# Patient Record
Sex: Male | Born: 1945 | Hispanic: Yes | Smoking: Former smoker
Health system: Southern US, Community
[De-identification: ages and names within clinical notes are randomized; demographics above are authoritative.]

## PROBLEM LIST (undated history)

## (undated) DIAGNOSIS — Z9289 Personal history of other medical treatment: Secondary | ICD-10-CM

## (undated) DIAGNOSIS — N186 End stage renal disease: Secondary | ICD-10-CM

## (undated) DIAGNOSIS — I2510 Atherosclerotic heart disease of native coronary artery without angina pectoris: Secondary | ICD-10-CM

## (undated) DIAGNOSIS — N179 Acute kidney failure, unspecified: Secondary | ICD-10-CM

## (undated) DIAGNOSIS — M1990 Unspecified osteoarthritis, unspecified site: Secondary | ICD-10-CM

## (undated) DIAGNOSIS — M109 Gout, unspecified: Secondary | ICD-10-CM

## (undated) DIAGNOSIS — D649 Anemia, unspecified: Secondary | ICD-10-CM

## (undated) DIAGNOSIS — I10 Essential (primary) hypertension: Secondary | ICD-10-CM

## (undated) DIAGNOSIS — Z992 Dependence on renal dialysis: Secondary | ICD-10-CM

## (undated) HISTORY — PX: APPENDECTOMY: SHX54

## (undated) HISTORY — PX: CATARACT EXTRACTION W/ INTRAOCULAR LENS  IMPLANT, BILATERAL: SHX1307

## (undated) HISTORY — PX: INGUINAL HERNIA REPAIR: SUR1180

## (undated) HISTORY — PX: COLONOSCOPY: SHX174

## (undated) HISTORY — PX: CORONARY ANGIOPLASTY WITH STENT PLACEMENT: SHX49

---

## 1990-04-04 HISTORY — PX: AV FISTULA PLACEMENT: SHX1204

## 1992-04-04 HISTORY — PX: KIDNEY TRANSPLANT: SHX239

## 2012-04-09 ENCOUNTER — Encounter (HOSPITAL_COMMUNITY): Payer: Self-pay | Admitting: Emergency Medicine

## 2012-04-09 ENCOUNTER — Emergency Department (INDEPENDENT_AMBULATORY_CARE_PROVIDER_SITE_OTHER)
Admission: EM | Admit: 2012-04-09 | Discharge: 2012-04-09 | Discharge status: Against Medical Advice | Payer: Medicare Other | Source: Home / Self Care | Attending: Emergency Medicine | Admitting: Emergency Medicine

## 2012-04-09 DIAGNOSIS — R05 Cough: Secondary | ICD-10-CM

## 2012-04-09 DIAGNOSIS — I4891 Unspecified atrial fibrillation: Secondary | ICD-10-CM

## 2012-04-09 HISTORY — DX: Essential (primary) hypertension: I10

## 2012-04-09 MED ORDER — BENZONATATE 200 MG PO CAPS: 200.0000 mg | ORAL_CAPSULE | Freq: Three times a day (TID) | ORAL | Status: DC | PRN

## 2012-04-09 MED ORDER — AZITHROMYCIN 250 MG PO TABS: ORAL_TABLET | ORAL | Status: DC

## 2012-04-09 NOTE — ED Provider Notes (Signed)
Chief Complaint  Patient presents with  . URI    History of Present Illness:   The patient is a 67 year old male who was visiting [place] from [place]. He presents today with a four-day history of a dry cough cough. He denies any shortness of breath, wheezing, chest tightness, or chest pain. He's had no sensation of palpitations, dizziness, or syncope. 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 He also has had a history of atrial fibrillation, he was taking antiarrhythmics and Coumadin but stopped these, I'm not certain whether he did this on a Zocor under medical direction. He hasn't had any fever, chills, nasal congestion, drainage, or sore throat. He has no history of asthma or COPD. He is not a cigarette smoker. He denies any reflux symptoms.  Review of Systems:  Other than noted above, the patient denies any of the following symptoms: Systemic:  No fevers, chills, sweats, weight loss or gain, fatigue, or tiredness. ENT:  No nasal congestion, sneezing, itching, postnasal drip, sinus pressure, headache, sore throat, or hoarseness. Lungs:  No wheezing, shortness of breath, chest tightness or congestion. Heart:  No chest pain, tightness, pressure, PND, orthopnea, or ankle edema. GI:  No indigestion, heartburn, waterbrash, burping, abdominal pain, nausea, or vomiting.  PMFSH:  Past medical history, family history, social history, meds, and allergies were reviewed.  Specifically, there is no history of asthma, allergies, reflux esophagitis or cigarette smoking.   Physical Exam:   Vital signs:  BP 157/84  Pulse 116  Temp 99.2 F (37.3 C) (Oral)  Resp 24  SpO2 96% General:  Alert and oriented.  In no distress.  Skin warm and dry. ENT: TMs and ear canals normal.  Nasal mucosa normal, without drainage.  Pharynx clear without exudate or drainage.  No intraoral lesions. Neck:  No adenopathy, tenderness or mass.  No JVD. Lungs:  No respiratory distress.  Breath sounds  clear and equal bilaterally.  No wheezes, rales or rhonchi. Heart:  Regular rhythm, no gallops or murmers.  No pedal edema. Abdomon:  Soft and nontender.  No organomegaly or mass.   Date: 04/09/2012  Rate: 113  Rhythm: atrial fibrillation  QRS Axis: left  Intervals: normal  ST/T Wave abnormalities: normal  Conduction Disutrbances:right bundle branch block and left anterior fascicular block  Narrative Interpretation: Atrial fibrillation with rapid ventricular response with premature ventricular or aberrantly conducted complexes, right bundle branch block, left anterior fascicular block, lateral infarct, age undetermined.  Old EKG Reviewed: none available  Assessment:  The primary encounter diagnosis was Cough. A diagnosis of Atrial fibrillation was also pertinent to this visit.  I am not certain whether the atrial fibrillation is new or something that's been going on for a while. His rate was not controlled and this may be contributing to his cough. I advised the patient that the best course would be for him to go to the hospital emergency room for further evaluation and he is not willing to do this and therefore signed out AGAINST MEDICAL ADVICE. I did give him some antibiotics and cough medications and advised that if he should get worse in any way that he immediately returned to the hospital emergency room.  Plan:   1.  The following meds were prescribed:   New Prescriptions   AZITHROMYCIN (ZITHROMAX Z-PAK) 250 MG TABLET    Take as directed.   BENZONATATE (TESSALON) 200 MG CAPSULE    Take 1 capsule (200 mg total) by mouth 3 (three) times  daily as needed for cough.   2.  The patient was instructed in symptomatic care and handouts were given. 3.  The patient was told to return if becoming worse in any way, if no better in 3 or 4 days, and given some red flag symptoms that would indicate earlier return.     Reuben Likes, MD 04/09/12 2222

## 2012-04-09 NOTE — ED Notes (Signed)
Pt c/o cold sx x3 days Sx include: dry cough, headache and nauseas Denies: fevers, vomiting, diarrhea, sore throat, congestion He is alert w/no signs of acute distress.

## 2012-04-09 NOTE — ED Notes (Signed)
Approx 2 min 12-lead rhythm strip done - given to Dr. Lorenz Coaster w/ ECG. Pt here visiting from [place] - unable to obtain previous ECG.

## 2012-05-11 LAB — PROTIME-INR

## 2012-05-14 ENCOUNTER — Emergency Department (INDEPENDENT_AMBULATORY_CARE_PROVIDER_SITE_OTHER)
Admission: EM | Admit: 2012-05-14 | Discharge: 2012-05-14 | Disposition: A | Discharge status: Home / Self Care | Payer: Medicare Other | Source: Home / Self Care | Attending: Family Medicine | Admitting: Family Medicine

## 2012-05-14 ENCOUNTER — Encounter (HOSPITAL_COMMUNITY): Payer: Self-pay

## 2012-05-14 DIAGNOSIS — I4891 Unspecified atrial fibrillation: Secondary | ICD-10-CM

## 2012-05-14 DIAGNOSIS — M109 Gout, unspecified: Secondary | ICD-10-CM

## 2012-05-14 DIAGNOSIS — I10 Essential (primary) hypertension: Secondary | ICD-10-CM

## 2012-05-14 DIAGNOSIS — Z940 Kidney transplant status: Secondary | ICD-10-CM

## 2012-05-14 LAB — COMPREHENSIVE METABOLIC PANEL
Albumin: 3.5 g/dL (ref 3.5–5.2)
Alkaline Phosphatase: 92 U/L (ref 39–117)
BUN: 30 mg/dL — ABNORMAL HIGH (ref 6–23)
CO2: 24 mEq/L (ref 19–32)
Chloride: 102 mEq/L (ref 96–112)
GFR calc Af Amer: 53 mL/min — ABNORMAL LOW (ref >90)
GFR calc non Af Amer: 46 mL/min — ABNORMAL LOW (ref >90)
Glucose, Bld: 176 mg/dL — ABNORMAL HIGH (ref 70–99)
Potassium: 5.6 mEq/L — ABNORMAL HIGH (ref 3.5–5.1)
Total Bilirubin: 0.6 mg/dL (ref 0.3–1.2)

## 2012-05-14 LAB — CBC
MCV: 88.8 fL (ref 78.0–100.0)
Platelets: 130 10*3/uL — ABNORMAL LOW (ref 150–400)
RDW: 14.5 % (ref 11.5–15.5)
WBC: 6.2 10*3/uL (ref 4.0–10.5)

## 2012-05-14 NOTE — Discharge Instructions (Signed)
Atrial Fibrillation Atrial fibrillation is an abnormal heartbeat (rhythm). It can cause your heart rate to be faster or slower than normal, and can cause clots of blood to form in your heart. These clots can cause other health problems. Atrial fibrillation may be caused by a heart attack, lung problem, or certain medicine. Sometimes the cause of atrial fibrillation is not found. HOME CARE  Take blood thinning medicine (anticoagulants) as told by your doctor. Your doctor will need to draw your blood to check lab values if you take blood thinners.  If you had a cardioversion, limit your activity as told by your doctor.  Learn how to check your heartbeat (pulse) for an abnormal or irregular beat. Your doctor can show you how.  Ask your doctor if it is okay to exercise.  Only take medicine as told by your doctor. GET HELP RIGHT AWAY IF:   You have trouble breathing or feel dizzy.  You have puffy (swollen) feet or ankles.  You have blood in your pee (urine) or poop (bowel movement).  You feel your heart "skipping" beats.  You feel your heart "racing" or beating fast.  You have weakness in your arms or legs.  You have trouble talking, seeing, or thinking.  You have chest pain or pain in your arm or jaw. MAKE SURE YOU:   Understand these instructions.  Will watch your condition.  Will get help right away if you are not doing well or get worse. Document Released: 12/29/2007 Document Revised: 06/13/2011 Document Reviewed: 07/09/2009 [facility] Patient Information 2013 [place], [place].  Aspirin and Your Heart Aspirin affects the way your blood clots and helps "thin" the blood. Aspirin has many uses in heart disease. It may be used as a primary prevention to help reduce the risk of heart related events. It also can be used as a secondary measure to prevent more heart attacks or to prevent additional damage from blood clots.  ASPIRIN MAY HELP IF YOU:  Have had a heart attack or chest  pain.  Have undergone open heart surgery such as CABG (Coronary Artery Bypass Surgery).  Have had coronary angioplasty with or without stents.  Have experienced a stroke or TIA (transient ischemic attack).  Have peripheral vascular disease (PAD).  Have chronic heart rhythm problems such as atrial fibrillation.  Are at risk for heart disease. BEFORE STARTING ASPIRIN Before you start taking aspirin, your caregiver will need to review your medical history. Many things will need to be taken into consideration, such as:  Smoking status.  Blood pressure.  Diabetes.  Gender.  Weight.  Cholesterol level. ASPIRIN DOSES  Aspirin should only be taken on the advice of your caregiver. Talk to your caregiver about how much aspirin you should take. Aspirin comes in different doses such as:  81 mg.  162 mg.  325 mg.  The aspirin dose you take may be affected by many factors, some of which include:  Your current medications, especially if your are taking blood-thinners or anti-platelet medicine.  Liver function.  Heart disease risk.  Age.  Aspirin comes in two forms:  Non-enteric-coated. This type of aspirin does not have a coating and is absorbed faster. Non-enteric coated aspirin is recommended for patients experiencing chest pain symptoms. This type of aspirin also comes in a chewable form.  Enteric-coated. This means the aspirin has a special coating that releases the medicine very slowly. Enteric-coated aspirin causes less stomach upset. This type of aspirin should not be chewed or crushed. ASPIRIN SIDE  EFFECTS Daily use of aspirin can increase your risk of serious side effects, some of these include:  Increased bleeding. This can range from a cut that does not stop bleeding to more serious problems such as stomach bleeding or bleeding into the brain (Intracerebral bleeding).  Increased bruising.  Stomach upset.  An allergic reaction such as red, itchy  skin.  Increased risk of bleeding when combined with non-steroidal anti-inflammatory medicine (NSAIDS).  Alcohol should be drank in moderation when taking aspirin. Alcohol can increase the risk of stomach bleeding when taken with aspirin.  Aspirin should not be given to children less than 7 years of age due to the association of Reye syndrome. Reye syndrome is a serious illness that can affect the brain and liver. Studies have linked Reye syndrome with aspirin use in children.  People that have nasal polyps have an increased risk of developing an aspirin allergy. SEEK MEDICAL CARE IF:   You develop an allergic reaction such as:  Hives.  Itchy skin.  Swelling of the lips, tongue or face.  You develop stomach pain.  You have unusual bleeding or bruising.  You have ringing in your ears. SEEK IMMEDIATE MEDICAL CARE IF:   You have severe chest pain, especially if the pain is crushing or pressure-like and spreads to the arms, back, neck, or jaw. THIS IS AN EMERGENCY. Do not wait to see if the pain will go away. Get medical help at once. Call your local emergency services (911 in the [place] DO NOT drive yourself to the hospital.  You have stroke-like symptoms such as:  Loss of vision.  Difficulty talking.  Numbness or weakness on one side of your body.  Numbness or weakness in your arm or leg.  Not thinking clearly or feeling confused.  Your bowel movements are bloody, dark red or black in color.  You vomit or cough up blood.  You have blood in your urine.  You have shortness of breath, coughing or wheezing. MAKE SURE YOU:   Understand these instructions.  Will monitor your condition.  Seek immediate medical care if necessary. Document Released: 03/03/2008 Document Revised: 06/13/2011 Document Reviewed: 03/03/2008 [facility] Patient Information 2013 [place], [place].  DASH Diet The DASH diet stands for "Dietary Approaches to Stop Hypertension." It is a healthy eating  plan that has been shown to reduce high blood pressure (hypertension) in as little as 14 days, while also possibly providing other significant health benefits. These other health benefits include reducing the risk of breast cancer after menopause and reducing the risk of type 2 diabetes, heart disease, colon cancer, and stroke. Health benefits also include weight loss and slowing kidney failure in patients with chronic kidney disease.  DIET GUIDELINES  Limit salt (sodium). Your diet should contain less than 1500 mg of sodium daily.  Limit refined or processed carbohydrates. Your diet should include mostly whole grains. Desserts and added sugars should be used sparingly.  Include small amounts of heart-healthy fats. These types of fats include nuts, oils, and tub margarine. Limit saturated and trans fats. These fats have been shown to be harmful in the body. CHOOSING FOODS  The following food groups are based on a 2000 calorie diet. See your Registered Dietitian for individual calorie needs. Grains and Grain Products (6 to 8 servings daily)  Eat More Often: Whole-wheat bread, brown rice, whole-grain or wheat pasta, quinoa, popcorn without added fat or salt (air popped).  Eat Less Often: White bread, white pasta, white rice, cornbread. Vegetables (4 to  5 servings daily)  Eat More Often: Fresh, frozen, and canned vegetables. Vegetables may be raw, steamed, roasted, or grilled with a minimal amount of fat.  Eat Less Often/Avoid: Creamed or fried vegetables. Vegetables in a cheese sauce. Fruit (4 to 5 servings daily)  Eat More Often: All fresh, canned (in natural juice), or frozen fruits. Dried fruits without added sugar. One hundred percent fruit juice ( cup [237 mL] daily).  Eat Less Often: Dried fruits with added sugar. Canned fruit in light or heavy syrup. [employer], Fish, and Poultry (2 servings or less daily. One serving is 3 to 4 oz [85-114 g]).  Eat More Often: Ninety percent or  leaner ground beef, tenderloin, sirloin. Round cuts of beef, chicken breast, [place] breast. All fish. Grill, bake, or broil your meat. Nothing should be fried.  Eat Less Often/Avoid: Fatty cuts of meat, [place], or chicken leg, thigh, or wing. Fried cuts of meat or fish. Dairy (2 to 3 servings)  Eat More Often: Low-fat or fat-free milk, low-fat plain or light yogurt, reduced-fat or part-skim cheese.  Eat Less Often/Avoid: Milk (whole, 2%).Whole milk yogurt. Full-fat cheeses. Nuts, Seeds, and Legumes (4 to 5 servings per week)  Eat More Often: All without added salt.  Eat Less Often/Avoid: Salted nuts and seeds, canned beans with added salt. Fats and Sweets (limited)  Eat More Often: Vegetable oils, tub margarines without trans fats, sugar-free gelatin. Mayonnaise and salad dressings.  Eat Less Often/Avoid: Coconut oils, palm oils, butter, stick margarine, cream, half and half, cookies, candy, pie. FOR MORE INFORMATION The Dash Diet Eating Plan: www.dashdiet.org Document Released: 03/10/2011 Document Revised: 06/13/2011 Document Reviewed: 03/10/2011 [facility] Patient Information 2013 [place], [place].  Coronary Artery Disease, Risk Factors Research has shown that the risk of developing coronary artery disease (CAD) and having a heart attack increases with each factor you have. RISK FACTORS YOU CANNOT CHANGE  Your age. Your risk goes up as you get older. Most heart attacks happen to people over the age of 40.  Gender. Men have a greater risk of heart attack than women, and they have attacks earlier in life. However, women are more likely to die from a heart attack.  Heredity. Children of parents with heart disease are more likely to develop it themselves.  Race. African Americans and other ethnic groups have a higher risk, possibly because of high blood pressure, a tendency toward obesity, and diabetes.  Your family. Most people with a strong family history of heart disease have one or  more other risk factors. RISK FACTORS YOU CAN CHANGE  Exposure to tobacco smoke. Even secondhand smoke greatly increases the risk for heart disease.  High blood cholesterol may be lowered with changes in diet, activity, and medicines.  High blood pressure makes the heart work harder. This causes the heart muscles to become thick and, eventually, weaker. It also increases your risk of stroke, heart attack, and kidney or heart failure.  Physical inactivity is a risk factor for CAD. Regular physical activity helps prevent heart and blood vessel disease. Exercise helps control blood cholesterol, diabetes, obesity, and it may help lower blood pressure in some people.  Excess body fat, especially belly fat, increases the risk of heart disease and stroke even if there are no other risk factors. Excess weight increases the heart's workload and raises blood pressure and blood cholesterol.  Diabetes seriously increases your risk of developing CAD. If you have diabetes, you should work with your caregiver to manage it and control other  risk factors. OTHER RISK FACTORS FOR CAD  How you respond to stress.  Drinking too much alcohol may raise blood pressure, cause heart failure, and lead to stroke.  Total cholesterol greater than 200 milligrams.  HDL (good) cholesterol less than 40 milligrams. HDL helps keep cholesterol from building up in the walls of the arteries. PREVENTING CAD  Maintain a healthy weight.  Exercise or do physical activity.  Eat a heart-healthy diet low in fat and salt and high in fiber.  Control your blood pressure to keep it below 120 over 80.  Keep your cholesterol at a level that lowers your risk.  Manage diabetes if you have it.  Stop smoking.  Learn how to manage stress. HEART SMART SUBSTITUTIONS  Instead of whole or 2% milk and cream, use skim milk.  Instead of fried foods, eat baked, steamed, boiled, broiled, or microwaved foods.  Instead of lard, butter,  palm and coconut oils, cook with unsaturated vegetable oils, such as corn, olive, canola, safflower, sesame, soybean, sunflower, or peanut.  Instead of fatty cuts of meat, eat lean cuts of meat or cut off the fatty parts.  Instead of 1 whole egg in recipes, use 2 egg whites.  Instead of sauces, butter, and salt, season vegetables with herbs and spices.  Instead of regular hard and processed cheeses, eat low-fat, low-sodium cheeses.  Instead of salted potato chips, choose low-fat, unsalted tortilla and potato chips and unsalted pretzels and popcorn.  Instead of sour cream and mayonnaise, use plain low-fat yogurt, low-fat cottage cheese, or low-fat or "light" sour cream. FOR MORE INFORMATION  National Heart Lung and Blood Institute: https://nielsen.com/ American Heart Association: PopSteam.is Document Released: 06/11/2003 Document Revised: 06/13/2011 Document Reviewed: 06/06/2007 [facility] Patient Information 2013 [place], [place].  How to Take Your Blood Pressure  These instructions are only for electronic home blood pressure machines. You will need:   An automatic or semi-automatic blood pressure machine.  Fresh batteries for the blood pressure machine. HOW DO I USE THESE TOOLS TO CHECK MY BLOOD PRESSURE?   There are 2 numbers that make up your blood pressure. For example: 120/80.  The first number (120 in our example) is called the "systolic pressure." It is a measure of the pressure in your blood vessels when your heart is pumping blood.  The second number (80 in our example) is called the "diastolic pressure." It is a measure of the pressure in your blood vessels when your heart is resting between beats.  Before you buy a home blood pressure machine, check the size of your arm so you can buy the right size cuff. Here is how to check the size of your arm:  Use a tape measure that shows both inches and centimeters.  Wrap the tape measure around the middle  upper part of your arm. You may need someone to help you measure right.  Write down your arm measurement in both inches and centimeters.  To measure your blood pressure right, it is important to have the right size cuff.  If your arm is up to 13 inches (37 to 34 centimeters), get an adult cuff size.  If your arm is 13 to 17 inches (35 to 44 centimeters), get a large adult cuff size.  If your arm is 17 to 20 inches (45 to 52 centimeters), get an adult thigh cuff.  Try to rest or relax for at least 30 minutes before you check your blood pressure.  Do not smoke.  Do not have any  drinks with caffeine, such as:  Pop.  Coffee.  Tea.  Check your blood pressure in a quiet room.  Sit down and stretch out your arm on a table. Keep your arm at about the level of your heart. Let your arm relax. GETTING BLOOD PRESSURE READINGS  Make sure you remove any tight-fighting clothing from your arm. Wrap the cuff around your upper arm. Wrap it just above the bend, and above where you felt the pulse. You should be able to slip a finger between the cuff and your arm. If you cannot slip a finger in the cuff, it is too tight and should be removed and rewrapped.  Some units requires you to manually pump up the arm cuff.  Automatic units inflate the cuff when you press a button.  Cuff deflation is automatic in both models.  After the cuff is inflated, the unit measures your blood pressure and pulse. The readings are displayed on a monitor. Hold still and breathe normally while the cuff is inflated.  Getting a reading takes less than a minute.  Some models store readings in a memory. Some provide a printout of readings.  Get readings at different times of the day. You should wait at least 5 minutes between readings. Take readings with you to your next doctor's visit. Document Released: 03/03/2008 Document Revised: 06/13/2011 Document Reviewed: 03/03/2008 [facility] Patient Information 2013 [place],  [place].  Gout Gout is caused by a buildup of uric acid crystals in the joints. The crystals make your joints sore. This is like having sand in your joints. Repeat attacks are common. Gout can be treated. HOME CARE   Do not take aspirin for pain.  Only take medicine as told by your doctor.  You may use cold treatments (ice) on painful joints.  Put ice in a plastic bag.  Place a towel between your skin and the bag.  Leave the ice on for 15 to 20 minutes at a time, 3 to 4 times a day.  Rest in bed as much as possible. When in bed, keep the sheets and blankets off your sore joints.  Keep the sore joints raised (elevated).  Use crutches if your legs or ankles hurt.  Drink enough water and fluids to keep your pee (urine) clear or pale yellow. This helps your body get rid of uric acid. Do not drink alcohol.  Follow diet instructions as told by your doctor.  Keep your body at a healthy weight. GET HELP RIGHT AWAY IF:   You have a temperature by mouth above 102 F (38.9 C), not controlled by medicine.  You have watery poop (diarrhea).  You are throwing up (vomiting).  You do not feel better in 1 day, or you are getting worse.  Your joint hurts more.  You have the chills. MAKE SURE YOU:   Understand these instructions.  Will watch your condition.  Will get help right away if you are not doing well or get worse. Document Released: 12/29/2007 Document Revised: 06/13/2011 Document Reviewed: 06/29/2009 [facility] Patient Information 2013 [place], [place].  Gout Gout is an inflammatory condition (arthritis) caused by a buildup of uric acid crystals in the joints. Uric acid is a chemical that is normally present in the blood. Under some circumstances, uric acid can form into crystals in your joints. This causes joint redness, soreness, and swelling (inflammation). Repeat attacks are common. Over time, uric acid crystals can form into masses (tophi) near a joint, causing disfigurement.  Gout is treatable and  often preventable. CAUSES  The disease begins with elevated levels of uric acid in the blood. Uric acid is produced by your body when it breaks down a naturally found substance called purines. This also happens when you eat certain foods such as meats and fish. Causes of an elevated uric acid level include:  Being passed down from parent to child (heredity).  Diseases that cause increased uric acid production (obesity, psoriasis, some cancers).  Excessive alcohol use.  Diet, especially diets rich in meat and seafood.  Medicines, including certain cancer-fighting drugs (chemotherapy), diuretics, and aspirin.  Chronic kidney disease. The kidneys are no longer able to remove uric acid well.  Problems with metabolism. Conditions strongly associated with gout include:  Obesity.  High blood pressure.  High cholesterol.  Diabetes. Not everyone with elevated uric acid levels gets gout. It is not understood why some people get gout and others do not. Surgery, joint injury, and eating too much of certain foods are some of the factors that can lead to gout. SYMPTOMS   An attack of gout comes on quickly. It causes intense pain with redness, swelling, and warmth in a joint.  Fever can occur.  Often, only one joint is involved. Certain joints are more commonly involved:  Base of the big toe.  Knee.  Ankle.  Wrist.  Finger. Without treatment, an attack usually goes away in a few days to weeks. Between attacks, you usually will not have symptoms, which is different from many other forms of arthritis. DIAGNOSIS  Your caregiver will suspect gout based on your symptoms and exam. Removal of fluid from the joint (arthrocentesis) is done to check for uric acid crystals. Your caregiver will give you a medicine that numbs the area (local anesthetic) and use a needle to remove joint fluid for exam. Gout is confirmed when uric acid crystals are seen in joint fluid, using a  special microscope. Sometimes, blood, urine, and X-ray tests are also used. TREATMENT  There are 2 phases to gout treatment: treating the sudden onset (acute) attack and preventing attacks (prophylaxis). Treatment of an Acute Attack  Medicines are used. These include anti-inflammatory medicines or steroid medicines.  An injection of steroid medicine into the affected joint is sometimes necessary.  The painful joint is rested. Movement can worsen the arthritis.  You may use warm or cold treatments on painful joints, depending which works best for you.  Discuss the use of coffee, vitamin C, or cherries with your caregiver. These may be helpful treatment options. Treatment to Prevent Attacks After the acute attack subsides, your caregiver may advise prophylactic medicine. These medicines either help your kidneys eliminate uric acid from your body or decrease your uric acid production. You may need to stay on these medicines for a very long time. The early phase of treatment with prophylactic medicine can be associated with an increase in acute gout attacks. For this reason, during the first few months of treatment, your caregiver may also advise you to take medicines usually used for acute gout treatment. Be sure you understand your caregiver's directions. You should also discuss dietary treatment with your caregiver. Certain foods such as meats and fish can increase uric acid levels. Other foods such as dairy can decrease levels. Your caregiver can give you a list of foods to avoid. HOME CARE INSTRUCTIONS   Do not take aspirin to relieve pain. This raises uric acid levels.  Only take over-the-counter or prescription medicines for pain, discomfort, or fever as directed by your  caregiver.  Rest the joint as much as possible. When in bed, keep sheets and blankets off painful areas.  Keep the affected joint raised (elevated).  Use crutches if the painful joint is in your leg.  Drink enough  water and fluids to keep your urine clear or pale yellow. This helps your body get rid of uric acid. Do not drink alcoholic beverages. They slow the passage of uric acid.  Follow your caregiver's dietary instructions. Pay careful attention to the amount of protein you eat. Your daily diet should emphasize fruits, vegetables, whole grains, and fat-free or low-fat milk products.  Maintain a healthy body weight. SEEK MEDICAL CARE IF:   You have an oral temperature above 102 F (38.9 C).  You develop diarrhea, vomiting, or any side effects from medicines.  You do not feel better in 24 hours, or you are getting worse. SEEK IMMEDIATE MEDICAL CARE IF:   Your joint becomes suddenly more tender and you have:  Chills.  An oral temperature above 102 F (38.9 C), not controlled by medicine. MAKE SURE YOU:   Understand these instructions.  Will watch your condition.  Will get help right away if you are not doing well or get worse. Document Released: 03/18/2000 Document Revised: 06/13/2011 Document Reviewed: 06/29/2009 [facility] Patient Information 2013 [place], [place].  Immunosuppressant Use Following Kidney Transplant Immunosuppressant is a term used to describe medications that lower the body's ability to reject (try to get rid of) a transplanted organ. Another term for these drugs is anti-rejection drugs.  When you get a kidney transplant, your body knows that the new kidney is new and unknown to your body. Your body will attack the new kidney and try to destroy it. This reaction is similar to other allergic reactions. The immunosuppressant drugs decrease the body's ability to do this. The goal is to adjust these drugs to prevent rejection but minimize side effects.  Most people with a transplant must take these drugs every day. However, if your new kidney came from an identical twin, you may not have to take them. Even missing a single dose may make it more likely for you to have a rejection.  The only time you should skip a dose is if your caregiver tells you. Call your caregiver if you have questions. Because of the large number of pills that may be needed, forgetting a dose is easy to do. Two things can be done to prevent this.   You must know the name of each drug you take and what it does. If you have a good understanding of your drugs, you will be less likely to forget one.  A pill organizer is something that can be used to make missing a medication less likely. This is a device that allows you to set up an entire week of pills at once. Once the week is set up, all you have to do is take the pills on the right day and time.  If you forget a dose of medication, take it as soon as you remember and call your caregiver. If it is time for the next dose, do not take a double dose. SIGNS OR SYMPTOMS OF TRANSPLANT REJECTION Even though you are taking your medicines every day, you may still develop rejection of the kidney transplant. You need to know your body very well. Call your transplant center right away if you have any of the following:   Decreased urine output.  A temperature above 100 F (37.8 C).  Tenderness in the area of your new kidney.  Bloody or foul smelling urine.  Flu-like feelings.  Weight gain (more than 3 pounds in two days). Weigh your self daily and record your weights.  Blood tests and perhaps other tests may need to be done. The long-term success of your kidney transplant depends largely on good follow-up with your transplant team. One of the side effects of immunosuppressants is an increased risk of infection. This is more of a problem in the early period after a transplant or following treatment of a rejection because the dosage of these drugs is higher at these times. You should call the transplant center if you have:   A fever above 100 F (37.8 C).  Drainage or bad odor of drainage from your surgical scar.  Burning when you pass your urine.  A  cold or cough that will not go away.  Flu-like feelings. WHAT ARE THE DIFFERENT DRUGS AND HOW SHOULD I TAKE THEM? Prednisone should be taken with meals or food because it can be irritating to the stomach. Azathioprine should be taken once a day. Cyclosporine comes in liquid and capsule form. It is usually taken twice a day, but the dose is often adjusted based on the amount in your blood. Your transplant team or caregiver will provide you with instructions.  POSSIBLE SIDE EFFECTS OF THESE DRUGS  Prednisone: Weight gain due to increased appetite, acne (pimples), buffalo hump (fat in the upper back and low back), muscle weakness, trouble sleeping, stomach ulcers, diabetes (either new or worsening), and cataracts (eye problems).  Azathioprine: Low white blood cell count, liver problems, anemia, and nightmares.  Cyclosporine:Nephrotoxicity (kidney damage), overgrowth of gums, hair growth, hair darkening, high blood pressure, tremors of the hands, and liver problems. Most patients will have one or more of these side effects. Usually, adjusting drug dosages or treating the side effects can manage these problems. If you are having problems or have unanswered questions, talk to your caregiver. Document Released: 01/22/2004 Document Revised: 06/13/2011 Document Reviewed: 06/29/2005 [facility] Patient Information 2013 [place], [place].  Warfarin, Questions and Answers Warfarin (Coumadin) is a prescription medicine that delays normal blood clotting. This is called an "anticoagulant" or "blood thinner." This medicine does not actually cause the blood to become less thick, only less likely to clot. Normally, when body tissues are cut or damaged, the blood clots in order to prevent blood loss. Some clots form when they are not supposed to and may travel through the bloodstream and become lodged in smaller blood vessels. Blockage of blood flow can cause serious problems including a stroke (when a blood vessel  leading to a portion of the brain is blocked) or a pulmonary embolus (where a blood clot in the leg travels to and lodges in the pulmonary arteries, causing blockage of blood flow to the lungs). WHO SHOULD USE WARFARIN?  Warfarin is prescribed for individuals who have a risk for developing harmful blood clots. People at risk include individuals with a mechanical heart valve, individuals with the irregular heart rhythm called atrial fibrillation, and individuals with certain clotting disorders.  Warfarin is also used in individuals who have a history of developing harmful clots, including individuals who have had a stroke, a clot which has traveled to the lung (pulmonary embolism), or a blood clot in the leg (deep venous thrombosis, DVT).  Warfarin may be used to prevent the growth of an existing clot, such as a DVT. HOW IS THE EFFECT OF WARFARIN MONITORED? The goal of  warfarin therapy is to lessen the clotting tendency of blood, but not to prevent clotting completely. Therefore, the anticoagulation effect of warfarin must be carefully watched using laboratory blood tests.  The test most commonly used to measure the effects of warfarin is the prothrombin time (protime, PT).  The PT is also used to compute a value known as the International Normalized Ratio (INR).  The longer it takes the blood to clot, the higher the PT and INR. Your caregiver will tell you what your "target" INR range is. In most cases, the target range will be 2 to 3, although other ranges may be chosen. If, at any time, your INR is below the target range, there is a risk of clotting. If your INR is above the target range, there is an increased risk of bleeding. When warfarin is first prescribed, a higher "loading" dose may be given so that an effective blood level of the medicine is reached quickly. The loading dose is then adjusted downward until a maintenance dose (a dose which is enough to keep the INR where it should be) is  reached. Once you are on a stable maintenance dose, the PT and INR are watched less often, usually once every 2 to 4 weeks. The warfarin dose may be changed at times in response to a changing INR or to medical changes that call for an increase or decrease in warfarin therapy. It is important to keep all laboratory and caregiver follow-up appointments. Not keeping appointments could result in a chronic or permanent injury, pain, disability, and even death. WHAT ARE THE SIDE EFFECTS OF WARFARIN?  Excessive blood thinning can cause bleeding (hemorrhage) from any part of the body. Individuals on warfarin should report any falls or accidents, or any symptoms of bleeding or unusual bruising. Signs of unusual bleeding may include bleeding from the gums, blood in the urine, bloody or dark stool, a nosebleed, coughing up blood, or vomiting blood. Because the risk of bleeding increases as the INR rises above the desired limit, the INR is closely watched during warfarin therapy. Adjustments are made as needed to keep the INR at an acceptable level (within the target range).  Too little warfarin continues to allow the risk for blood clots.  Other body systems can be affected by warfarin. In addition to any signs of bleeding, patients should report skin rash or irritation, unusual fever, continual nausea or stomach upset, severe pain in the joints or back, swelling or pain at an injection site, new and severe headaches, or symptoms of a stroke (new weaknesses, change in mental status, visual changes, new dizziness, trouble speaking).  IS WARFARIN SAFE TO USE DURING PREGNANCY?  Warfarin is generally not advised during pregnancy, at least during the first trimester, due to an increased risk of birth defects. A woman who becomes pregnant or plans to become pregnant while on warfarin therapy should notify the caregiver immediately.  Although warfarin does not pass into breast milk, a woman who wishes to breastfeed while  taking warfarin should also consult with her caregiver. ARE THERE SPECIAL PRECAUTIONS TO FOLLOW WHILE TAKING WARFARIN? Follow the dosage schedule carefully. Warfarin should be taken exactly as directed. A missed or extra dose requires a call to the prescribing caregiver for advice. Do not change the dose of warfarin on your own to make up for missed or extra doses. It is very important to take warfarin as directed since bleeding or blood clots could result in chronic or permanent injury, pain, or disability.  Warfarin tablets come in different strengths. Each tablet strength is usually a different color, with the amount of warfarin (in milligrams) clearly printed on the tablet. You should immediately report to the pharmacist or caregiver any prescription which is different than the previous one, to make sure that you are taking the correct dose. Avoid situations that cause bleeding. You may have a tendency to bleed more easily than usual while taking warfarin. The following changes can limit bleeding:  Using a softer toothbrush.  Flossing with waxed floss rather than unwaxed floss.  Shaving with an Neurosurgeon rather than a blade.  Limiting the use of sharp objects.  Avoiding potentially harmful activities (such as contact sports). Medical conditions. Medical conditions which increase your clotting time include:  Diarrhea.  Worsening of heart failure.  Fever.  Worsening of liver function. Alcohol use. Alcohol can change the body's ability to handle warfarin. It is best to avoid alcoholic drinks or consume only very small amounts while taking warfarin. Notify your caregiver if you change your alcohol intake. Other medicines. A number of drugs can interact with warfarin. Some of the most common over-the-counter pain relievers (acetaminophen, aspirin, and nonsteroidal anti-inflammatory medicines) make the anticoagulant effects of warfarin stronger. If these medicines are taken, your caregiver  may need to adjust the dose of warfarin. The use of many antibiotics can increase the effects of warfarin, and therefore increase bleeding risk. Do not take or discontinue any prescribed or over-the-counter medicine except on the advice of your caregiver or pharmacist.  Dietary considerations.Do not drink herbal teas containing coumarin while taking this medicine. Many foods, especially foods high in vitamin K can interfere with warfarin and affect the PT and INR results. Foods high in vitamin K can cause warfarin to be less effective. You should eat a consistent amount of foods high in vitamin K. The serving size for foods high in vitamin K are  cup cooked and 1 cup raw, unless otherwise noted. These foods include:  Beef liver (3.5 ounces).  Garbanzo beans.  Kale.  Spinach.  Nettle greens.  Swiss chard.  Pork liver (3.5 ounces).  Broccoli.  Cabbage.  Watercress.  Soybeans.  Endive.  Green tea (made with  ounce dried tea).  Brussels sprouts.  Cauliflower.  Parsley (1 Tablespoon).  Collard greens.  Seaweed (limit 2 sheets).  Turnip greens.  Soybean oil.  Green peas. It is not necessary to avoid these foods, but avoid major changes in your diet, or notify your caregiver before changing your diet. Changing your diet to include less foods containing vitamin K may lead to an excessive warfarin effect. Arrange a visit with a dietitian to answer your questions.  IS IT SAFE TO USE SUPPLEMENTS WHILE TAKING WARFARIN?  Vitamin E. Vitamin E may increase the anticoagulant effects of warfarin. You should consult your caregiver before adding or changing a dose of vitamin E or any other vitamin.  Check other supplements such as multivitamins and calcium supplements. These may contain vitamin K. Document Released: 03/21/2005 Document Revised: 09/20/2011 Document Reviewed: 08/30/2011 [facility] Patient Information 2013 [place], [place].  Warfarin  Warfarin is a blood thinner  (anticoagulant) medicine. It is used to thin the blood so blood clots do not form. This medicine may cause very bad bleeding. You may also bruise easily while on this medicine. You may also bleed a little longer if you cut yourself.  Before taking warfarin, tell your doctor if:  You take any other medicine for your heart or blood pressure.  You are pregnant.  You plan to get pregnant.  You are breastfeeding.  You are allergic to any medicine. HOME CARE  Have your blood checked as told by your doctor. Do not miss visits. Blood tests will include the PT and INR tests. These tests measure how long it takes for a clot to form in a blood sample. The test results help your doctor change your dose of warfarin if needed. If you miss your blood test visits, you could have bad bleeding or blood clots.  Take warfarin exactly as told by your doctor. If you do not, bleeding or blood clots could lead to lasting injury, pain, or disability.  Take your medicine at the same time every day. Do not miss a dose.  Tell your doctor about all medicine you take. This includes medicines, vitamins, natural products, or dietary pills (supplements). Certain medicines are not safe to take while taking warfarin. Taking other medicines while taking warfarin can affect your PT and INR test results.  Do not start or stop any medicine unless you have been told to do so by your doctor.  Do not take anything that has aspirin in it unless your doctor says it is okay.  Eat the same amount of vitamin K foods every day. Vitamin K foods can change how warfarin works and your PT and INR test results.   High vitamin K foods: Beef liver. Pork liver. Green tea. Broccoli. Brussels sprouts. Cauliflower. Chickpeas. Kale. Spinach. Turnip greens.  Medium vitamin K foods: Chicken liver. Pork tenderloin. Cheddar cheese. Rolled oats. Coffee. Asparagus. Cabbage. Iceberg lettuce.  Low vitamin K foods: Apples. Butter. Bananas. Skim or 1%  milk. Canned pears. White bread. Strawberries. Corn. Tomatoes. Green beans. Eggs. Potatoes. Tomasa Blase. Pumpkin. Chicken breasts. Ground beef. Oil (except soybean oil).  Avoid making major changes to your normal diet. Talk to your doctor first before changing your normal diet.  Do not drink alcohol.  Tell your doctors and dentists that you are taking warfarin at the beginning of every visit. GET HELP RIGHT AWAY IF:  You miss a dose of warfarin. Do not take 2 doses at the same time.  You have a skin rash.  You have heavy or unusual bleeding.  There is blood in your pee (urine) or poop (stool).  You have side effects from medicine that do not get better after a few days. MAKE SURE YOU:  Understand these instructions.  Will watch your condition.  Will get help right away if you are not doing well or get worse. Document Released: 04/23/2010 Document Revised: 09/20/2011 Document Reviewed: 04/23/2010 [facility] Patient Information 2013 [place], [place].

## 2012-05-14 NOTE — ED Notes (Signed)
Patient states has a history of gout in his left elbow, left hand and both knees

## 2012-05-14 NOTE — ED Provider Notes (Signed)
History    CSN: [id number]  Arrival date & time 05/14/12  1733   First MD Initiated Contact with Patient 05/14/12 1803     Chief Complaint  Patient presents with  . Gout   HPI Pt has persistent gout that has gotten worse over past year.  Pt says that he is going to a PCP and cardiologist and nephrologist in [place].  He still is going to them but travels to this area frequently.  He says that he would like a local doctor to go to for his problems.  He says that he had a chest pain episode 2 weeks ago and it resolved when he took to ecotrin tablets.  He says that he had a cardiac stent placed 10 years ago.  He has also a history of atrial fibrillation.  Pt says that he has been checked recently by his cardiologist and by his nephrologist and was given a clean bill of health.  He says that he had a stress test almost 2 years ago.   Pt says that he takes colchicine daily for gout.    Past Medical History  Diagnosis Date  . Hypertension     Past Surgical History  Procedure Laterality Date  . Kidney transplant      No family history on file.  History  Substance Use Topics  . Smoking status: Never Smoker   . Smokeless tobacco: Not on file  . Alcohol Use: No    Review of Systems  HENT: Positive for congestion.   Respiratory: Positive for chest tightness.   Musculoskeletal: Positive for joint swelling and arthralgias.       Gout pain   All other systems reviewed and are negative.    Allergies  Review of patient's allergies indicates no known allergies.  Home Medications   Current Outpatient Rx  Name  Route  Sig  Dispense  Refill  . azithromycin (ZITHROMAX Z-PAK) 250 MG tablet      Take as directed.   6 tablet   0   . benzonatate (TESSALON) 200 MG capsule   Oral   Take 1 capsule (200 mg total) by mouth 3 (three) times daily as needed for cough.   30 capsule   0   . COLCHICINE PO   Oral   Take by mouth.         . CycloSPORINE Modified (NEORAL PO)   Oral  Take by mouth.         . Mycophenolate Sodium (MYFORTIC PO)   Oral   Take by mouth.         Marland Kitchen PREDNISONE PO   Oral   Take by mouth.         . Valsartan (DIOVAN PO)   Oral   Take by mouth.           BP 129/82  Pulse 91  Temp(Src) 97.2 F (36.2 C) (Oral)  SpO2 100%  Physical Exam  Nursing note and vitals reviewed. Constitutional: He is oriented to person, place, and time. He appears well-developed and well-nourished. No distress.  HENT:  Head: Normocephalic and atraumatic.  Eyes: Conjunctivae and EOM are normal. Pupils are equal, round, and reactive to light.  Neck: Normal range of motion. Neck supple.  Cardiovascular: Normal rate, regular rhythm and normal heart sounds.   Pulmonary/Chest: Effort normal and breath sounds normal.  Large midline scar from previous CABG   Abdominal: Soft. Bowel sounds are normal.  Musculoskeletal: Normal range of motion. He exhibits  edema and tenderness.  Gout tophi seen on fingers, hands and left elbow   Neurological: He is alert and oriented to person, place, and time. No cranial nerve deficit. He exhibits normal muscle tone. Coordination normal.  Skin: Skin is warm and dry.  Psychiatric: He has a normal mood and affect. His behavior is normal. Judgment and thought content normal.    ED Course  Procedures (including critical care time)  Labs Reviewed - No data to display No results found.  No diagnosis found.  MDM  IMPRESSION  Atrial Fibrillation, chronic, rate controlled, anticoagulated  Gout  Hypertension  History of Renal Transplant  Chronic Anticoagulation with Warfarin   RECOMMENDATIONS / PLAN  Check labs today. Establish with local cardiology and with establish with local nephrologist Establish with local coumadin clinic, pt says he had his PT checked with his PCP 2 days ago and will have repeated in a couple of days. Pt is still following with his PCP in [place].  He says that he will continue to  follow with them but will come here while he is in town.  Pt says that he prefers to continue having his PCP manage his PT/INR/Warfarin in [place] as they have been doing this for a very long time and they communicate regularly.  He plans to have his PT/INR rechecked in 2 days.   Make Referrals for local cardiology and nephrology so that the patient can become established.   Obtain old records  FOLLOW UP 3 months  The patient was given clear instructions to go to ER or return to medical center if symptoms don't improve, worsen or new problems develop.  The patient verbalized understanding.  The patient was told to call to get lab results if they haven't heard anything in the next week.            Cleora Fleet, MD 05/14/12 [phone]

## 2012-05-15 ENCOUNTER — Emergency Department (INDEPENDENT_AMBULATORY_CARE_PROVIDER_SITE_OTHER)
Admission: EM | Admit: 2012-05-15 | Discharge: 2012-05-15 | Disposition: A | Discharge status: Home / Self Care | Payer: Medicare Other | Source: Home / Self Care

## 2012-05-15 DIAGNOSIS — I10 Essential (primary) hypertension: Secondary | ICD-10-CM

## 2012-05-15 DIAGNOSIS — I4891 Unspecified atrial fibrillation: Secondary | ICD-10-CM

## 2012-05-15 LAB — COMPREHENSIVE METABOLIC PANEL
AST: 18 U/L (ref 0–37)
Albumin: 3.4 g/dL — ABNORMAL LOW (ref 3.5–5.2)
Chloride: 103 mEq/L (ref 96–112)
Creatinine, Ser: 1.58 mg/dL — ABNORMAL HIGH (ref 0.50–1.35)
Sodium: 139 mEq/L (ref 135–145)
Total Bilirubin: 0.5 mg/dL (ref 0.3–1.2)

## 2012-05-15 LAB — PROTIME-INR: INR: 1.72 — ABNORMAL HIGH (ref 0.00–1.49)

## 2012-05-15 NOTE — ED Notes (Signed)
Referral to urologist  Waiting for an appt

## 2012-05-15 NOTE — ED Notes (Signed)
Referred to cardiology has appt on 05/30/12 @1 :45pm

## 2012-05-15 NOTE — Progress Notes (Signed)
Quick Note:  I called and spoke with pt's PCP Dr. Urban Gibson [phone] to discuss his lab results. She says that his potassium is normally not elevated. She said she would recommend that we bring him back in to repeat the potassium and if it is elevated then to go ahead and give him a dose of kayexalate. She says that he has been stable on diovan for a long time and it has not affected his potassium. She says that his creatinine has been stable at around 1.5. She says that she has been following him closely for a long time and she has been working with him closely regarding his warfarin PT/INR. Please call patient and have him to return to have his potassium rechecked and we can do his PT/INR and fax results to Dr. Urban Gibson. She says that patient is returning next month.    Rodney Langton, MD, CDE, FAAFP [facility] [place], [place]   ______

## 2012-05-15 NOTE — Progress Notes (Signed)
Quick Note:  Please notify patient that his repeat potassium level came back OK and his INR is now 1.72. He needs to speak with Dr. Urban Gibson about his warfarin use. Please fax these results to Dr. Urban Gibson at [phone]  Jeffrey Langton, MD, CDE, FAAFP [facility]  [facility] [place], [place]   ______

## 2012-05-15 NOTE — ED Notes (Signed)
Patient here for repeat blood work(cmp/pt/inr)

## 2012-05-16 NOTE — Progress Notes (Signed)
Faxed lab results to pt's PCP Dr. Dorene Grebe at [phone]   C. Laural Benes, MD

## 2012-05-21 ENCOUNTER — Telehealth (HOSPITAL_COMMUNITY): Payer: Self-pay

## 2012-05-21 NOTE — Telephone Encounter (Signed)
Message copied by Lestine Mount on Mon May 21, 2012  9:58 AM ------      Message from: Cleora Fleet      Created: Tue May 15, 2012  2:59 PM       Please notify patient that his repeat potassium level came back OK and his INR is now 1.72.  He needs to speak with Dr. Urban Gibson about his warfarin use.  Please fax these results to Dr. Urban Gibson at [phone]            Rodney Langton, MD, CDE, FAAFP      [facility]        [facility]      [place], [place]        ------

## 2012-05-23 ENCOUNTER — Emergency Department (INDEPENDENT_AMBULATORY_CARE_PROVIDER_SITE_OTHER)
Admission: EM | Admit: 2012-05-23 | Discharge: 2012-05-23 | Disposition: A | Discharge status: Home / Self Care | Payer: Medicare Other | Source: Home / Self Care

## 2012-05-23 LAB — PROTIME-INR: Prothrombin Time: 25 seconds — ABNORMAL HIGH (ref 11.6–15.2)

## 2012-05-23 NOTE — ED Notes (Signed)
Patient here for pt/inr blood work only

## 2012-05-24 NOTE — Progress Notes (Signed)
Quick Note:  Please fax results to pt's C PCP Dr. Dorene Grebe at [phone]  C. Laural Benes, MD ______

## 2012-05-30 ENCOUNTER — Encounter: Payer: Self-pay | Admitting: Cardiovascular Disease

## 2012-05-30 ENCOUNTER — Ambulatory Visit (INDEPENDENT_AMBULATORY_CARE_PROVIDER_SITE_OTHER): Payer: Medicare Other | Admitting: Cardiovascular Disease

## 2012-05-30 VITALS — BP 152/98 | HR 55 | Ht 66.0 in | Wt 156.1 lb

## 2012-05-30 DIAGNOSIS — I480 Paroxysmal atrial fibrillation: Secondary | ICD-10-CM | POA: Insufficient documentation

## 2012-05-30 DIAGNOSIS — M109 Gout, unspecified: Secondary | ICD-10-CM | POA: Insufficient documentation

## 2012-05-30 DIAGNOSIS — I4891 Unspecified atrial fibrillation: Secondary | ICD-10-CM

## 2012-05-30 NOTE — Patient Instructions (Addendum)
Your physician wants you to follow-up in: 6 months You will receive a reminder letter in the mail two months in advance. If you don't receive a letter, please call our office to schedule the follow-up appointment.  Your physician recommends that you return for a FASTING lipid profile: 6 months   Your physician recommends that you continue on your current medications as directed. Please refer to the Current Medication list given to you today.  

## 2012-05-30 NOTE — Assessment & Plan Note (Addendum)
The patient is doing well. He is in normal sinus rhythm today. We'll continue with his current medications. I'll see her again in 6 months for an office visit, fasting labs, EKG.  He seems to have paroxysmal atrial fibrillation. We will likely need to do an echocardiogram later this year. I will see him again in 6 months.

## 2012-05-30 NOTE — Progress Notes (Signed)
     Becker Brand Date of Birth  01-21-1946       [facility] Office [address], [address]  [address] [place], [place]  [address]   [place], [place]  [address] [phone]     [phone]   Fax  [phone]    Fax [phone]  Problem List: 1. Paroxysmal Atrial Fibrillation 2. Hypertension 3. Gout - multiple tophi on arms 4. CAD :  Hx of stenting  ( [facility])  5.  Hx of ESRD - s/p renal transplant x 2,  History of Present Illness:  Pt presents today for evaluation of PAF.  He cannot tell if his heart is beating greatly but he does have frequent episodes of shortness breath-particularly in the morning.  He is able to exercise on a regular basis and is not limited because of his atrial fibrillation.  Current Outpatient Prescriptions on File Prior to Visit  Medication Sig Dispense Refill  . COLCHICINE PO Take 0.6 mg by mouth daily.       . CycloSPORINE Modified (NEORAL PO) Take 75 mg by mouth 2 (two) times daily.       . Mycophenolate Sodium (MYFORTIC PO) Take 360 mg by mouth 2 (two) times daily.       Marland Kitchen PREDNISONE PO Take by mouth.      . Valsartan (DIOVAN PO) Take 80 mg by mouth daily.        No current facility-administered medications on file prior to visit.  Coumadin daily.  No Known Allergies  Past Medical History  Diagnosis Date  . Hypertension     Past Surgical History  Procedure Laterality Date  . Kidney transplant      History  Smoking status  . Never Smoker   Smokeless tobacco  . Not on file    History  Alcohol Use No    No family history on file.  Reviw of Systems:  Reviewed in the HPI.  All other systems are negative.  Physical Exam: Blood pressure 152/98, pulse 55, height 5\' 6"  (1.676 m), weight 156 lb 1.9 oz (70.816 kg). General: Well developed, well nourished, in no acute distress.  Head: Normocephalic, atraumatic, sclera non-icteric, mucus membranes are moist,   Neck: Supple. Carotids  are 2 + without bruits. No JVD   Lungs: Clear   Heart: RR, normal S1, S2.  Abdomen: Soft, non-tender, non-distended with normal bowel sounds.  Msk:  Strength and tone are normal   Extremities: No clubbing or cyanosis. No edema.  Distal pedal pulses are 2+ and equal  Multiple tophi on arms.  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  Neuro: CN II - XII intact.  Alert and oriented X 3.   Psych:  Normal   ECG: Every 26th 2014: Sinus bradycardia 54 beats a minute. He has occasional premature atrial contractions. He has an incomplete right bundle branch block.  Assessment / Plan:

## 2012-06-06 ENCOUNTER — Encounter: Payer: Self-pay | Admitting: Family Medicine

## 2012-06-06 DIAGNOSIS — Z940 Kidney transplant status: Secondary | ICD-10-CM

## 2012-06-06 DIAGNOSIS — I10 Essential (primary) hypertension: Secondary | ICD-10-CM | POA: Insufficient documentation

## 2012-06-06 DIAGNOSIS — Z7901 Long term (current) use of anticoagulants: Secondary | ICD-10-CM

## 2012-06-26 ENCOUNTER — Emergency Department (INDEPENDENT_AMBULATORY_CARE_PROVIDER_SITE_OTHER)
Admission: EM | Admit: 2012-06-26 | Discharge: 2012-06-26 | Disposition: A | Discharge status: Home / Self Care | Payer: Medicare Other | Source: Home / Self Care | Attending: Family Medicine | Admitting: Family Medicine

## 2012-06-26 ENCOUNTER — Encounter (HOSPITAL_COMMUNITY): Payer: Self-pay

## 2012-06-26 DIAGNOSIS — J309 Allergic rhinitis, unspecified: Secondary | ICD-10-CM

## 2012-06-26 DIAGNOSIS — J209 Acute bronchitis, unspecified: Secondary | ICD-10-CM

## 2012-06-26 DIAGNOSIS — M109 Gout, unspecified: Secondary | ICD-10-CM

## 2012-06-26 DIAGNOSIS — I10 Essential (primary) hypertension: Secondary | ICD-10-CM

## 2012-06-26 DIAGNOSIS — Z940 Kidney transplant status: Secondary | ICD-10-CM

## 2012-06-26 DIAGNOSIS — I480 Paroxysmal atrial fibrillation: Secondary | ICD-10-CM

## 2012-06-26 DIAGNOSIS — Z7901 Long term (current) use of anticoagulants: Secondary | ICD-10-CM

## 2012-06-26 MED ORDER — ALBUTEROL SULFATE HFA 108 (90 BASE) MCG/ACT IN AERS: 2.0000 | INHALATION_SPRAY | Freq: Four times a day (QID) | RESPIRATORY_TRACT | Status: DC | PRN

## 2012-06-26 MED ORDER — AMOXICILLIN 500 MG PO CAPS: 500.0000 mg | ORAL_CAPSULE | Freq: Two times a day (BID) | ORAL | Status: DC

## 2012-06-26 MED ORDER — ALBUTEROL SULFATE (5 MG/ML) 0.5% IN NEBU
2.5000 mg | INHALATION_SOLUTION | Freq: Once | RESPIRATORY_TRACT | Status: AC
  Administered 2012-06-26: 2.5 mg via RESPIRATORY_TRACT

## 2012-06-26 MED ORDER — ALBUTEROL SULFATE (5 MG/ML) 0.5% IN NEBU
INHALATION_SOLUTION | RESPIRATORY_TRACT | Status: AC
  Filled 2012-06-26: qty 0.5

## 2012-06-26 MED ORDER — CETIRIZINE HCL 10 MG PO TABS: 10.0000 mg | ORAL_TABLET | Freq: Every day | ORAL | Status: DC

## 2012-06-26 NOTE — ED Provider Notes (Signed)
History     CSN: [id number]  Arrival date & time 06/26/12  1518   First MD Initiated Contact with Patient 06/26/12 1601     Chief Complaint  Patient presents with  . URI   HPI Pt reports 1 weeks of wheezing and coughing at night.  Pt reports mostly symptomatic at night, some brownish sputum production.  No fever or chills reported.  No rash.  Other symptoms have been stable.     Past Medical History  Diagnosis Date  . Hypertension     Past Surgical History  Procedure Laterality Date  . Kidney transplant      No family history on file.  History  Substance Use Topics  . Smoking status: Never Smoker   . Smokeless tobacco: Not on file  . Alcohol Use: No    Review of Systems  Respiratory: Positive for cough, chest tightness and wheezing. Negative for shortness of breath.   Cardiovascular: Negative.   Gastrointestinal: Negative.   Genitourinary: Negative.   Musculoskeletal: Negative.   Skin: Negative.   Hematological: Negative.   Psychiatric/Behavioral: Negative.   All other systems reviewed and are negative.    Allergies  Review of patient's allergies indicates no known allergies.  Home Medications   Current Outpatient Rx  Name  Route  Sig  Dispense  Refill  . amiodarone (PACERONE) 200 MG tablet   Oral   Take 200 mg by mouth daily.         Marland Kitchen aspirin 81 MG tablet   Oral   Take 81 mg by mouth daily.         . COLCHICINE PO   Oral   Take 0.6 mg by mouth daily.          . Cyanocobalamin (VITAMIN B-12 PO)   Oral   Take by mouth daily.         . CycloSPORINE Modified (NEORAL PO)   Oral   Take 75 mg by mouth 2 (two) times daily.          Marland Kitchen doxazosin (CARDURA) 8 MG tablet   Oral   Take 8 mg by mouth 2 (two) times daily.         . Mycophenolate Sodium (MYFORTIC PO)   Oral   Take 360 mg by mouth 2 (two) times daily.          . prednisoLONE 5 MG TABS   Oral   Take 5 mg by mouth daily.         Marland Kitchen PREDNISONE PO   Oral   Take by  mouth.         . Pyridoxine HCl (VITAMIN B-6 PO)   Oral   Take by mouth daily.         . Valsartan (DIOVAN PO)   Oral   Take 80 mg by mouth daily.          Marland Kitchen warfarin (COUMADIN) 6 MG tablet   Oral   Take 6 mg by mouth daily.           BP 172/87  Pulse 67  Temp(Src) 97.8 F (36.6 C) (Oral)  SpO2 100%  Physical Exam  Nursing note and vitals reviewed. Constitutional: He is oriented to person, place, and time. He appears well-developed and well-nourished. No distress.  HENT:  Head: Normocephalic and atraumatic.  Eyes: Conjunctivae and EOM are normal. Pupils are equal, round, and reactive to light. Right eye exhibits no discharge. Left eye exhibits no discharge. No scleral icterus.  Neck: Normal range of motion. Neck supple. No JVD present. No tracheal deviation present. No thyromegaly present.  Cardiovascular: Normal rate and normal heart sounds.  Exam reveals no gallop and no friction rub.   Pulmonary/Chest: Effort normal. He has wheezes. He has rales. He exhibits no tenderness.  Abdominal: Soft. Bowel sounds are normal.  Musculoskeletal: Normal range of motion. He exhibits no edema and no tenderness.  Lymphadenopathy:    He has no cervical adenopathy.  Neurological: He is alert and oriented to person, place, and time. No cranial nerve deficit. Coordination normal.  Skin: Skin is warm and dry. No rash noted. No erythema. No pallor.  Psychiatric: He has a normal mood and affect. His behavior is normal. Judgment and thought content normal.    ED Course  Procedures (including critical care time)  Labs Reviewed - No data to display No results found.  No diagnosis found.  MDM  IMPRESSION Atrial fibrillation on warfarin - chronic Gout HTN Acute bronchitis  Seasonal allergies S/p renal transplant  RECOMMENDATIONS / PLAN Rechecked blood pressure in office and 147/90 Nebulizer treatment given in office Albuterol HFA as needed for wheezing to use at night if  having symptoms OTC cetirizine  Pt to hold on to prescription for amoxicillin and not to take unless symptoms worsen or he develops signs of fever or chills, etc.   Pt verbalized understanding.   FOLLOW UP 2 week BP recheck (nurse visit) 1 month recheck BP (regular visit)   The patient was given clear instructions to go to ER or return to medical center if symptoms don't improve, worsen or new problems develop.  The patient verbalized understanding.  The patient was told to call to get lab results if they haven't heard anything in the next week.            Cleora Fleet, MD 06/26/12 2117

## 2012-06-26 NOTE — ED Notes (Signed)
Patient states not feeling well Congestion, cough( worse at night) Denies fever

## 2013-02-07 ENCOUNTER — Encounter: Payer: Self-pay | Admitting: Internal Medicine

## 2013-02-07 ENCOUNTER — Ambulatory Visit: Payer: Medicare Other | Attending: Internal Medicine | Admitting: Internal Medicine

## 2013-02-07 VITALS — BP 146/74 | HR 71 | Temp 98.0°F | Resp 18 | Wt 152.0 lb

## 2013-02-07 DIAGNOSIS — Z23 Encounter for immunization: Secondary | ICD-10-CM

## 2013-02-07 DIAGNOSIS — Z940 Kidney transplant status: Secondary | ICD-10-CM

## 2013-02-07 DIAGNOSIS — I10 Essential (primary) hypertension: Secondary | ICD-10-CM

## 2013-02-07 DIAGNOSIS — I480 Paroxysmal atrial fibrillation: Secondary | ICD-10-CM

## 2013-02-07 DIAGNOSIS — I4891 Unspecified atrial fibrillation: Secondary | ICD-10-CM

## 2013-02-07 DIAGNOSIS — M109 Gout, unspecified: Secondary | ICD-10-CM

## 2013-02-07 MED ORDER — PREDNISOLONE 5 MG PO TABS: 5.0000 mg | ORAL_TABLET | Freq: Every day | ORAL | Status: DC

## 2013-02-07 NOTE — Progress Notes (Signed)
Patient ID: Jeffrey Perez, male   DOB: Dec 24, 1945, 67 y.o.   MRN: [id number]  CC: Followup  HPI: 67 year old male with past medical history of renal transplantation on immunosuppressive therapy, hypertension, gout, atrial fibrillation on Coumadin who presented to clinic for followup. Patient has no current complaints. No chest pain or shortness of breath. No complaints of palpitations.  No Known Allergies Past Medical History  Diagnosis Date  . Hypertension    Current Outpatient Prescriptions on File Prior to Visit  Medication Sig Dispense Refill  . albuterol (PROVENTIL HFA;VENTOLIN HFA) 108 (90 BASE) MCG/ACT inhaler Inhale 2 puffs into the lungs every 6 (six) hours as needed for wheezing.  1 Inhaler  2  . amiodarone (PACERONE) 200 MG tablet Take 200 mg by mouth daily.      Marland Kitchen aspirin 81 MG tablet Take 81 mg by mouth daily.      . cetirizine (ZYRTEC) 10 MG tablet Take 1 tablet (10 mg total) by mouth daily.  24 tablet  0  . COLCHICINE PO Take 0.6 mg by mouth daily.       . Cyanocobalamin (VITAMIN B-12 PO) Take by mouth daily.      . CycloSPORINE Modified (NEORAL PO) Take 75 mg by mouth 2 (two) times daily.       Marland Kitchen doxazosin (CARDURA) 8 MG tablet Take 8 mg by mouth 2 (two) times daily.      . Mycophenolate Sodium (MYFORTIC PO) Take 360 mg by mouth 2 (two) times daily.       . Pyridoxine HCl (VITAMIN B-6 PO) Take by mouth daily.      . Valsartan (DIOVAN PO) Take 80 mg by mouth daily.       Marland Kitchen warfarin (COUMADIN) 6 MG tablet Take 6 mg by mouth daily.       No current facility-administered medications on file prior to visit.   Hypertension in parents.  History   Social History  . Marital Status: Married    Spouse Name: N/A    Number of Children: N/A  . Years of Education: N/A   Occupational History  . Not on file.   Social History Main Topics  . Smoking status: Never Smoker   . Smokeless tobacco: Not on file  . Alcohol Use: No  . Drug Use: No  . Sexual Activity:    Other  Topics Concern  . Not on file   Social History Narrative  . No narrative on file    Review of Systems  Constitutional: Negative for fever, chills, diaphoresis, activity change, appetite change and fatigue.  HENT: Negative for ear pain, nosebleeds, congestion, facial swelling, rhinorrhea, neck pain, neck stiffness and ear discharge.   Eyes: Negative for pain, discharge, redness, itching and visual disturbance.  Respiratory: Negative for cough, choking, chest tightness, shortness of breath, wheezing and stridor.   Cardiovascular: Negative for chest pain, palpitations and leg swelling.  Gastrointestinal: Negative for abdominal distention.  Genitourinary: Negative for dysuria, urgency, frequency, hematuria, flank pain, decreased urine volume, difficulty urinating and dyspareunia.  Musculoskeletal: Negative for back pain, joint swelling, arthralgias and gait problem.  Neurological: Negative for dizziness, tremors, seizures, syncope, facial asymmetry, speech difficulty, weakness, light-headedness, numbness and headaches.  Hematological: Negative for adenopathy. Does not bruise/bleed easily.  Psychiatric/Behavioral: Negative for hallucinations, behavioral problems, confusion, dysphoric mood, decreased concentration and agitation.    Objective:   Filed Vitals:   02/07/13 0942  BP: 146/74  Pulse: 71  Temp: 98 F (36.7 C)  Resp: 18  Physical Exam  Constitutional: Appears well-developed and well-nourished. No distress.  HENT: Normocephalic. External right and left ear normal. Oropharynx is clear and moist.  Eyes: Conjunctivae and EOM are normal. PERRLA, no scleral icterus.  Neck: Normal ROM. Neck supple. No JVD. No tracheal deviation. No thyromegaly.  CVS: Irregular rhythm, rate controlled, S1/S2 appreciated  Pulmonary: Effort and breath sounds normal, no stridor, rhonchi, wheezes, rales.  Abdominal: Soft. BS +,  no distension, tenderness, rebound or guarding.  Musculoskeletal: Normal  range of motion. No edema and no tenderness.  Lymphadenopathy: No lymphadenopathy noted, cervical, inguinal. Neuro: Alert. Normal reflexes, muscle tone coordination. No cranial nerve deficit. Skin: Skin is warm and dry. No rash noted. Not diaphoretic. No erythema. No pallor.  Psychiatric: Normal mood and affect. Behavior, judgment, thought content normal.   Lab Results  Component Value Date   WBC 6.2 05/14/2012   HGB 11.2* 05/14/2012   HCT 34.0* 05/14/2012   MCV 88.8 05/14/2012   PLT 130* 05/14/2012   Lab Results  Component Value Date   CREATININE 1.58* 05/15/2012   BUN 36* 05/15/2012   NA 139 05/15/2012   K 4.4 05/15/2012   CL 103 05/15/2012   CO2 25 05/15/2012    Lab Results  Component Value Date   HGBA1C 5.6 05/14/2012   Lipid Panel  No results found for this basename: chol, trig, hdl, cholhdl, vldl, ldlcalc       Assessment and plan:   Patient Active Problem List   Diagnosis Date Noted  . Hypertension 06/06/2012    Priority: Medium - We have discussed target BP range - I have advised pt to check BP regularly and to call us back if the numbers are higher than 140/90 - discussed the importance of compliance with medical therapy and diet  - continue taking diovan  . History of renal transplant 06/06/2012    Priority: Medium - on cyclosporine, mycophenolate, prednisone  - also take vitamin B6 supplememntation  . Paroxysmal atrial fibrillation 05/30/2012    Priority: Medium - on amiodarone and coumadin - will check INR today  . Gout 05/30/2012    Priority: Medium - continue colchicine  . Preventive health - will get flu shot today 06/06/2012

## 2013-02-07 NOTE — Progress Notes (Signed)
Pt is here f/u and needing refills on meds Also needing blood work and needing the flu shot Voices no new concerns Hx of renal transplant... Alert w/no signs of acute distress.

## 2013-02-07 NOTE — Patient Instructions (Addendum)
Schedule lab appointment for Monday to check PT/INR Thursday = 5mg  Friday = 7.5mg  Saturday = 5mg  Sunday = 7.5mg   Hypertension As your heart beats, it forces blood through your arteries. This force is your blood pressure. If the pressure is too high, it is called hypertension (HTN) or high blood pressure. HTN is dangerous because you may have it and not know it. High blood pressure may mean that your heart has to work harder to pump blood. Your arteries may be narrow or stiff. The extra work puts you at risk for heart disease, stroke, and other problems.  Blood pressure consists of two numbers, a higher number over a lower, 110/72, for example. It is stated as "110 over 72." The ideal is below 120 for the top number (systolic) and under 80 for the bottom (diastolic). Write down your blood pressure today. You should pay close attention to your blood pressure if you have certain conditions such as:  Heart failure.  Prior heart attack.  Diabetes  Chronic kidney disease.  Prior stroke.  Multiple risk factors for heart disease. To see if you have HTN, your blood pressure should be measured while you are seated with your arm held at the level of the heart. It should be measured at least twice. A one-time elevated blood pressure reading (especially in the Emergency Department) does not mean that you need treatment. There may be conditions in which the blood pressure is different between your right and left arms. It is important to see your caregiver soon for a recheck. Most people have essential hypertension which means that there is not a specific cause. This type of high blood pressure may be lowered by changing lifestyle factors such as:  Stress.  Smoking.  Lack of exercise.  Excessive weight.  Drug/tobacco/alcohol use.  Eating less salt. Most people do not have symptoms from high blood pressure until it has caused damage to the body. Effective treatment can often prevent, delay or  reduce that damage. TREATMENT  When a cause has been identified, treatment for high blood pressure is directed at the cause. There are a large number of medications to treat HTN. These fall into several categories, and your caregiver will help you select the medicines that are best for you. Medications may have side effects. You should review side effects with your caregiver. If your blood pressure stays high after you have made lifestyle changes or started on medicines,   Your medication(s) may need to be changed.  Other problems may need to be addressed.  Be certain you understand your prescriptions, and know how and when to take your medicine.  Be sure to follow up with your caregiver within the time frame advised (usually within two weeks) to have your blood pressure rechecked and to review your medications.  If you are taking more than one medicine to lower your blood pressure, make sure you know how and at what times they should be taken. Taking two medicines at the same time can result in blood pressure that is too low. SEEK IMMEDIATE MEDICAL CARE IF:  You develop a severe headache, blurred or changing vision, or confusion.  You have unusual weakness or numbness, or a faint feeling.  You have severe chest or abdominal pain, vomiting, or breathing problems. MAKE SURE YOU:   Understand these instructions.  Will watch your condition.  Will get help right away if you are not doing well or get worse. Document Released: 03/21/2005 Document Revised: 06/13/2011 Document Reviewed: 11/09/2007 [employer]  Patient Information 2014 [employer].  

## 2013-02-08 ENCOUNTER — Telehealth: Payer: Self-pay | Admitting: Internal Medicine

## 2013-02-08 NOTE — Telephone Encounter (Signed)
Heather from [facility] calling to confirm that med change is correct. Pt has been taking PREDNISONE for many years but prednisoLONE 5 MG TABS tablet was prescribed during last visit.  Please confirm that change is correct and f/u with pharmacist, [phone].

## 2013-02-08 NOTE — Telephone Encounter (Signed)
Spoke with pharmacist Per Dr Elisabeth Pigeon rx should be for prednisone Not prednisolone- 5 mg tabs

## 2013-02-11 ENCOUNTER — Other Ambulatory Visit: Payer: PRIVATE HEALTH INSURANCE

## 2013-05-10 ENCOUNTER — Encounter: Payer: Self-pay | Admitting: Internal Medicine

## 2013-05-10 ENCOUNTER — Ambulatory Visit: Payer: Medicare Other | Attending: Internal Medicine | Admitting: Internal Medicine

## 2013-05-10 ENCOUNTER — Other Ambulatory Visit: Payer: Self-pay | Admitting: Family Medicine

## 2013-05-10 VITALS — BP 140/80 | HR 98 | Temp 98.7°F | Resp 14 | Ht 66.0 in | Wt 153.2 lb

## 2013-05-10 DIAGNOSIS — Z940 Kidney transplant status: Secondary | ICD-10-CM

## 2013-05-10 DIAGNOSIS — I10 Essential (primary) hypertension: Secondary | ICD-10-CM

## 2013-05-10 DIAGNOSIS — I4891 Unspecified atrial fibrillation: Secondary | ICD-10-CM | POA: Insufficient documentation

## 2013-05-10 DIAGNOSIS — M109 Gout, unspecified: Secondary | ICD-10-CM

## 2013-05-10 LAB — CBC WITH DIFFERENTIAL/PLATELET
BASOS PCT: 0 % (ref 0–1)
Basophils Absolute: 0 10*3/uL (ref 0.0–0.1)
EOS ABS: 0.2 10*3/uL (ref 0.0–0.7)
EOS PCT: 3 % (ref 0–5)
HCT: 29.4 % — ABNORMAL LOW (ref 39.0–52.0)
HEMOGLOBIN: 9.6 g/dL — AB (ref 13.0–17.0)
LYMPHS ABS: 1.8 10*3/uL (ref 0.7–4.0)
Lymphocytes Relative: 33 % (ref 12–46)
MCH: 28.1 pg (ref 26.0–34.0)
MCHC: 32.7 g/dL (ref 30.0–36.0)
MCV: 86 fL (ref 78.0–100.0)
MONO ABS: 0.4 10*3/uL (ref 0.1–1.0)
MONOS PCT: 7 % (ref 3–12)
Neutro Abs: 3 10*3/uL (ref 1.7–7.7)
Neutrophils Relative %: 57 % (ref 43–77)
Platelets: 145 10*3/uL — ABNORMAL LOW (ref 150–400)
RBC: 3.42 MIL/uL — AB (ref 4.22–5.81)
RDW: 15.5 % (ref 11.5–15.5)
WBC: 5.3 10*3/uL (ref 4.0–10.5)

## 2013-05-10 NOTE — Patient Instructions (Signed)
2 Gram Low Sodium Diet A 2 gram sodium diet restricts the amount of sodium in the diet to no more than 2 g or 2000 mg daily. Limiting the amount of sodium is often used to help lower blood pressure. It is important if you have heart, liver, or kidney problems. Many foods contain sodium for flavor and sometimes as a preservative. When the amount of sodium in a diet needs to be low, it is important to know what to look for when choosing foods and drinks. The following includes some information and guidelines to help make it easier for you to adapt to a low sodium diet. QUICK TIPS  Do not add salt to food.  Avoid convenience items and fast food.  Choose unsalted snack foods.  Buy lower sodium products, often labeled as "lower sodium" or "no salt added."  Check food labels to learn how much sodium is in 1 serving.  When eating at a restaurant, ask that your food be prepared with less salt or none, if possible. READING FOOD LABELS FOR SODIUM INFORMATION The nutrition facts label is a good place to find how much sodium is in foods. Look for products with no more than 500 to 600 mg of sodium per meal and no more than 150 mg per serving. Remember that 2 g = 2000 mg. The food label may also list foods as:  Sodium-free: Less than 5 mg in a serving.  Very low sodium: 35 mg or less in a serving.  Low-sodium: 140 mg or less in a serving.  Light in sodium: 50% less sodium in a serving. For example, if a food that usually has 300 mg of sodium is changed to become light in sodium, it will have 150 mg of sodium.  Reduced sodium: 25% less sodium in a serving. For example, if a food that usually has 400 mg of sodium is changed to reduced sodium, it will have 300 mg of sodium. CHOOSING FOODS Grains  Avoid: Salted crackers and snack items. Some cereals, including instant hot cereals. Bread stuffing and biscuit mixes. Seasoned rice or pasta mixes.  Choose: Unsalted snack items. Low-sodium cereals, oats,  puffed wheat and rice, shredded wheat. English muffins and bread. Pasta. Meats  Avoid: Salted, canned, smoked, spiced, pickled meats, including fish and poultry. Bacon, ham, sausage, cold cuts, hot dogs, anchovies.  Choose: Low-sodium canned tuna and salmon. Fresh or frozen meat, poultry, and fish. Dairy  Avoid: Processed cheese and spreads. Cottage cheese. Buttermilk and condensed milk. Regular cheese.  Choose: Milk. Low-sodium cottage cheese. Yogurt. Sour cream. Low-sodium cheese. Fruits and Vegetables  Avoid: Regular canned vegetables. Regular canned tomato sauce and paste. Frozen vegetables in sauces. Olives. Pickles. Relishes. Sauerkraut.  Choose: Low-sodium canned vegetables. Low-sodium tomato sauce and paste. Frozen or fresh vegetables. Fresh and frozen fruit. Condiments  Avoid: Canned and packaged gravies. Worcestershire sauce. Tartar sauce. Barbecue sauce. Soy sauce. Steak sauce. Ketchup. Onion, garlic, and table salt. Meat flavorings and tenderizers.  Choose: Fresh and dried herbs and spices. Low-sodium varieties of mustard and ketchup. Lemon juice. Tabasco sauce. Horseradish. SAMPLE 2 GRAM SODIUM MEAL PLAN Breakfast / Sodium (mg)  1 cup low-fat milk / 143 mg  2 slices whole-wheat toast / 270 mg  1 tbs heart-healthy margarine / 153 mg  1 hard-boiled egg / 139 mg  1 small orange / 0 mg Lunch / Sodium (mg)  1 cup raw carrots / 76 mg   cup hummus / 298 mg  1 cup low-fat milk /   143 mg   cup red grapes / 2 mg  1 whole-wheat pita bread / 356 mg Dinner / Sodium (mg)  1 cup whole-wheat pasta / 2 mg  1 cup low-sodium tomato sauce / 73 mg  3 oz lean ground beef / 57 mg  1 small side salad (1 cup raw spinach leaves,  cup cucumber,  cup yellow bell pepper) with 1 tsp olive oil and 1 tsp red wine vinegar / 25 mg Snack / Sodium (mg)  1 container low-fat vanilla yogurt / 107 mg  3 graham cracker squares / 127 mg Nutrient Analysis  Calories: 2033  Protein:  77 g  Carbohydrate: 282 g  Fat: 72 g  Sodium: 1971 mg Document Released: 03/21/2005 Document Revised: 06/13/2011 Document Reviewed: 06/22/2009 [employer] Patient Information 2014 [employer].  

## 2013-05-10 NOTE — Progress Notes (Signed)
MRN: [id number] Name: Jeffrey Perez  Sex: male Age: 68 y.o. DOB: 1945/08/16  Allergies: Review of patient's allergies indicates no known allergies.  Chief Complaint  Patient presents with  . Follow-up    hypertension    HPI: Patient is 68 y.o. male who has history of hypertension, A. fib on Coumadin following up with the cardiology who monitor his INR, patient denies any bleeding, gout, history of renal transplant on immunosuppressive therapy following up with the nephrologist as per patient he was recently started on tacrolimus and he has brought the lab requisition for the level to be checked, denies any acute symptoms his blood pressure today initially was elevated repeat manual is 140/80 he is on Diovan denies any headache dizziness chest and shortness of breath.  Past Medical History  Diagnosis Date  . Hypertension     Past Surgical History  Procedure Laterality Date  . Kidney transplant        Medication List       This list is accurate as of: 05/10/13  1:34 PM.  Always use your most recent med list.               albuterol 108 (90 BASE) MCG/ACT inhaler  Commonly known as:  PROVENTIL HFA;VENTOLIN HFA  Inhale 2 puffs into the lungs every 6 (six) hours as needed for wheezing.     amiodarone 200 MG tablet  Commonly known as:  PACERONE  Take 200 mg by mouth daily.     aspirin 81 MG tablet  Take 81 mg by mouth daily.     cetirizine 10 MG tablet  Commonly known as:  ZYRTEC  Take 1 tablet (10 mg total) by mouth daily.     COLCHICINE PO  Take 0.6 mg by mouth daily.     DIOVAN PO  Take 80 mg by mouth daily.     doxazosin 8 MG tablet  Commonly known as:  CARDURA  Take 8 mg by mouth 2 (two) times daily.     MYFORTIC PO  Take 360 mg by mouth 2 (two) times daily.     NEORAL PO  Take 75 mg by mouth 2 (two) times daily.     prednisoLONE 5 MG Tabs tablet  Take 1 tablet (5 mg total) by mouth daily.     VITAMIN B-12 PO  Take by mouth daily.     VITAMIN  B-6 PO  Take by mouth daily.     warfarin 6 MG tablet  Commonly known as:  COUMADIN  Take 6 mg by mouth daily.        No orders of the defined types were placed in this encounter.    Immunization History  Administered Date(s) Administered  . Influenza Split 02/07/2013    No family history on file.  History  Substance Use Topics  . Smoking status: Never Smoker   . Smokeless tobacco: Not on file  . Alcohol Use: No    Review of Systems   As noted in HPI  Filed Vitals:   05/10/13 0943  BP: 140/80  Pulse:   Temp:   Resp:     Physical Exam  Physical Exam  Constitutional: No distress.  Eyes: EOM are normal. Pupils are equal, round, and reactive to light.  Cardiovascular:  Regularly irregular   Pulmonary/Chest: Breath sounds normal. No respiratory distress. He has no wheezes. He has no rales.  Abdominal: Soft. There is no tenderness.    CBC    Component Value  Date/Time   WBC 6.2 05/14/2012 1818   RBC 3.83* 05/14/2012 1818   HGB 11.2* 05/14/2012 1818   HCT 34.0* 05/14/2012 1818   PLT 130* 05/14/2012 1818   MCV 88.8 05/14/2012 1818    CMP     Component Value Date/Time   NA 139 05/15/2012 1244   K 4.4 05/15/2012 1244   CL 103 05/15/2012 1244   CO2 25 05/15/2012 1244   GLUCOSE 85 05/15/2012 1244   BUN 36* 05/15/2012 1244   CREATININE 1.58* 05/15/2012 1244   CALCIUM 8.8 05/15/2012 1244   PROT 6.3 05/15/2012 1244   ALBUMIN 3.4* 05/15/2012 1244   AST 18 05/15/2012 1244   ALT 13 05/15/2012 1244   ALKPHOS 82 05/15/2012 1244   BILITOT 0.5 05/15/2012 1244   GFRNONAA 43* 05/15/2012 1244   GFRAA 50* 05/15/2012 1244    No results found for this basename: chol,  tri,  ldl    No components found with this basename: hga1c    Lab Results  Component Value Date/Time   AST 18 05/15/2012 12:44 PM    Assessment and Plan  Hypertension Continue with Diovan was prescribed also advise patient follow salt diet and follow up with his cardiologist and nephrologist  History of  renal transplant Labs will be checked for tacrolimus and recent his nephrologist Gout Continue with current medications. Return in about 3 months (around 08/07/2013).  Lorayne Marek, MD

## 2013-05-10 NOTE — Progress Notes (Signed)
Pt is here for a f/u for hypertension. Requests some blood work for Tacrolimus/Prograf Level and blood thinners, possible CMP and urinalysis.

## 2013-05-11 LAB — COMPREHENSIVE METABOLIC PANEL
ALK PHOS: 70 U/L (ref 39–117)
ALT: 9 U/L (ref 0–53)
AST: 13 U/L (ref 0–37)
Albumin: 3.9 g/dL (ref 3.5–5.2)
BILIRUBIN TOTAL: 0.6 mg/dL (ref 0.2–1.2)
BUN: 34 mg/dL — AB (ref 6–23)
CO2: 22 meq/L (ref 19–32)
CREATININE: 1.46 mg/dL — AB (ref 0.50–1.35)
Calcium: 9.2 mg/dL (ref 8.4–10.5)
Chloride: 107 mEq/L (ref 96–112)
GLUCOSE: 93 mg/dL (ref 70–99)
Potassium: 4.5 mEq/L (ref 3.5–5.3)
Sodium: 140 mEq/L (ref 135–145)
TOTAL PROTEIN: 6.6 g/dL (ref 6.0–8.3)

## 2013-05-11 LAB — URINALYSIS, ROUTINE W REFLEX MICROSCOPIC
BILIRUBIN URINE: NEGATIVE
GLUCOSE, UA: NEGATIVE mg/dL
Hgb urine dipstick: NEGATIVE
Ketones, ur: NEGATIVE mg/dL
LEUKOCYTES UA: NEGATIVE
Nitrite: NEGATIVE
PH: 5.5 (ref 5.0–8.0)
PROTEIN: NEGATIVE mg/dL
SPECIFIC GRAVITY, URINE: 1.009 (ref 1.005–1.030)
Urobilinogen, UA: 0.2 mg/dL (ref 0.0–1.0)

## 2013-05-11 LAB — TACROLIMUS LEVEL: TACROLIMUS LVL: 5.1 ng/mL (ref 5.0–20.0)

## 2013-06-13 ENCOUNTER — Emergency Department (INDEPENDENT_AMBULATORY_CARE_PROVIDER_SITE_OTHER)
Admission: EM | Admit: 2013-06-13 | Discharge: 2013-06-13 | Disposition: A | Discharge status: Home / Self Care | Payer: Medicare Other | Source: Home / Self Care | Attending: Family Medicine | Admitting: Family Medicine

## 2013-06-13 ENCOUNTER — Encounter (HOSPITAL_COMMUNITY): Payer: Self-pay | Admitting: Emergency Medicine

## 2013-06-13 DIAGNOSIS — K625 Hemorrhage of anus and rectum: Secondary | ICD-10-CM

## 2013-06-13 NOTE — ED Notes (Signed)
C/o rectal bleeding for a day followed by diarrhea since 06/06/13 No tx tried States stool is not watery but very loose

## 2013-06-13 NOTE — ED Notes (Signed)
hemoccult card is negative

## 2013-06-13 NOTE — Discharge Instructions (Signed)
Try align and activia yogurt , see your doctor for colonoscopy when possible.

## 2013-06-13 NOTE — ED Provider Notes (Signed)
CSN: [id number]     Arrival date & time 06/13/13  1936 History   First MD Initiated Contact with Patient 06/13/13 2002     Chief Complaint  Patient presents with  . Diarrhea  . Rectal Bleeding   (Consider location/radiation/quality/duration/timing/severity/associated sxs/prior Treatment) Patient is a 68 y.o. male presenting with diarrhea and hematochezia. The history is provided by the patient and the spouse.  Diarrhea Quality:  Semi-solid Severity:  Moderate Onset quality:  Sudden Duration:  1 week Progression:  Unchanged Associated symptoms: no abdominal pain and no vomiting   Associated symptoms comment:  Noticed blood with bm x1 1 wk ago, since has had loose bm daily, no pain , has had 2 or 3 colonoscopies , due in may for another, is on coumadin Rectal Bleeding Associated symptoms: no abdominal pain and no vomiting     Past Medical History  Diagnosis Date  . Hypertension    Past Surgical History  Procedure Laterality Date  . Kidney transplant     History reviewed. No pertinent family history. History  Substance Use Topics  . Smoking status: Never Smoker   . Smokeless tobacco: Not on file  . Alcohol Use: No    Review of Systems  Constitutional: Negative.   Gastrointestinal: Positive for diarrhea, blood in stool and hematochezia. Negative for nausea, vomiting and abdominal pain.    Allergies  Review of patient's allergies indicates no known allergies.  Home Medications   Current Outpatient Rx  Name  Route  Sig  Dispense  Refill  . albuterol (PROVENTIL HFA;VENTOLIN HFA) 108 (90 BASE) MCG/ACT inhaler   Inhalation   Inhale 2 puffs into the lungs every 6 (six) hours as needed for wheezing.   1 Inhaler   2   . amiodarone (PACERONE) 200 MG tablet   Oral   Take 200 mg by mouth daily.         Marland Kitchen aspirin 81 MG tablet   Oral   Take 81 mg by mouth daily.         . cetirizine (ZYRTEC) 10 MG tablet   Oral   Take 1 tablet (10 mg total) by mouth daily.   24  tablet   0   . COLCHICINE PO   Oral   Take 0.6 mg by mouth daily.          . Cyanocobalamin (VITAMIN B-12 PO)   Oral   Take by mouth daily.         . CycloSPORINE Modified (NEORAL PO)   Oral   Take 75 mg by mouth 2 (two) times daily.          Marland Kitchen doxazosin (CARDURA) 8 MG tablet   Oral   Take 8 mg by mouth 2 (two) times daily.         . Mycophenolate Sodium (MYFORTIC PO)   Oral   Take 360 mg by mouth 2 (two) times daily.          . prednisoLONE 5 MG TABS tablet   Oral   Take 1 tablet (5 mg total) by mouth daily.   90 each   6   . Pyridoxine HCl (VITAMIN B-6 PO)   Oral   Take by mouth daily.         . Valsartan (DIOVAN PO)   Oral   Take 80 mg by mouth daily.          Marland Kitchen warfarin (COUMADIN) 6 MG tablet   Oral   Take 6 mg by  mouth daily.          BP 198/91  Pulse 84  Temp(Src) 98.2 F (36.8 C) (Oral)  Resp 18  SpO2 100% Physical Exam  Nursing note and vitals reviewed. Constitutional: He is oriented to person, place, and time. He appears well-developed and well-nourished. No distress.  Abdominal: Soft. Bowel sounds are normal. He exhibits no distension and no mass. There is no tenderness. There is no rebound and no guarding.  Genitourinary: Rectum normal and prostate normal. Rectal exam shows no external hemorrhoid, no internal hemorrhoid, no fissure, no mass, no tenderness and anal tone normal. Guaiac negative stool.  Neurological: He is alert and oriented to person, place, and time.  Skin: Skin is warm and dry.    ED Course  Procedures (including critical care time) Labs Review Labs Reviewed - No data to display Imaging Review No results found.   MDM   1. Rectal bleeding        Billy Fischer, MD 06/13/13 2029

## 2013-08-07 ENCOUNTER — Ambulatory Visit: Payer: Medicare Other | Admitting: Internal Medicine

## 2017-01-25 ENCOUNTER — Emergency Department (HOSPITAL_COMMUNITY): Payer: Medicare Other

## 2017-01-25 ENCOUNTER — Inpatient Hospital Stay (HOSPITAL_COMMUNITY)
Admission: EM | Admit: 2017-01-25 | Discharge: 2017-01-28 | DRG: 948 | Disposition: A | Discharge status: Home / Self Care | Payer: Medicare Other | Attending: Internal Medicine | Admitting: Internal Medicine

## 2017-01-25 ENCOUNTER — Observation Stay (HOSPITAL_COMMUNITY): Payer: Medicare Other

## 2017-01-25 ENCOUNTER — Encounter (HOSPITAL_COMMUNITY): Payer: Self-pay | Admitting: *Deleted

## 2017-01-25 DIAGNOSIS — E2740 Unspecified adrenocortical insufficiency: Secondary | ICD-10-CM | POA: Diagnosis not present

## 2017-01-25 DIAGNOSIS — D638 Anemia in other chronic diseases classified elsewhere: Secondary | ICD-10-CM | POA: Diagnosis present

## 2017-01-25 DIAGNOSIS — D649 Anemia, unspecified: Secondary | ICD-10-CM | POA: Diagnosis present

## 2017-01-25 DIAGNOSIS — Z7982 Long term (current) use of aspirin: Secondary | ICD-10-CM | POA: Diagnosis not present

## 2017-01-25 DIAGNOSIS — Z79899 Other long term (current) drug therapy: Secondary | ICD-10-CM | POA: Diagnosis not present

## 2017-01-25 DIAGNOSIS — R531 Weakness: Principal | ICD-10-CM

## 2017-01-25 DIAGNOSIS — I493 Ventricular premature depolarization: Secondary | ICD-10-CM | POA: Diagnosis present

## 2017-01-25 DIAGNOSIS — N179 Acute kidney failure, unspecified: Secondary | ICD-10-CM | POA: Diagnosis present

## 2017-01-25 DIAGNOSIS — T8619 Other complication of kidney transplant: Secondary | ICD-10-CM | POA: Diagnosis not present

## 2017-01-25 DIAGNOSIS — I10 Essential (primary) hypertension: Secondary | ICD-10-CM | POA: Diagnosis present

## 2017-01-25 DIAGNOSIS — Z940 Kidney transplant status: Secondary | ICD-10-CM | POA: Diagnosis not present

## 2017-01-25 DIAGNOSIS — J432 Centrilobular emphysema: Secondary | ICD-10-CM | POA: Diagnosis present

## 2017-01-25 DIAGNOSIS — I480 Paroxysmal atrial fibrillation: Secondary | ICD-10-CM | POA: Diagnosis not present

## 2017-01-25 DIAGNOSIS — I1311 Hypertensive heart and chronic kidney disease without heart failure, with stage 5 chronic kidney disease, or end stage renal disease: Secondary | ICD-10-CM | POA: Diagnosis not present

## 2017-01-25 DIAGNOSIS — N289 Disorder of kidney and ureter, unspecified: Secondary | ICD-10-CM

## 2017-01-25 DIAGNOSIS — Z7901 Long term (current) use of anticoagulants: Secondary | ICD-10-CM

## 2017-01-25 DIAGNOSIS — D696 Thrombocytopenia, unspecified: Secondary | ICD-10-CM | POA: Diagnosis present

## 2017-01-25 HISTORY — DX: Acute kidney failure, unspecified: N17.9

## 2017-01-25 HISTORY — DX: Personal history of other medical treatment: Z92.89

## 2017-01-25 HISTORY — DX: End stage renal disease: N18.6

## 2017-01-25 HISTORY — DX: Dependence on renal dialysis: Z99.2

## 2017-01-25 HISTORY — DX: Unspecified osteoarthritis, unspecified site: M19.90

## 2017-01-25 HISTORY — DX: Gout, unspecified: M10.9

## 2017-01-25 HISTORY — DX: Anemia, unspecified: D64.9

## 2017-01-25 HISTORY — DX: Atherosclerotic heart disease of native coronary artery without angina pectoris: I25.10

## 2017-01-25 LAB — URINALYSIS, ROUTINE W REFLEX MICROSCOPIC
BILIRUBIN URINE: NEGATIVE
GLUCOSE, UA: NEGATIVE mg/dL
Hgb urine dipstick: NEGATIVE
Ketones, ur: NEGATIVE mg/dL
LEUKOCYTES UA: NEGATIVE
NITRITE: NEGATIVE
Protein, ur: NEGATIVE mg/dL
Specific Gravity, Urine: 1.008 (ref 1.005–1.030)
pH: 6 (ref 5.0–8.0)

## 2017-01-25 LAB — CBC
HCT: 33.1 % — ABNORMAL LOW (ref 39.0–52.0)
Hemoglobin: 10.3 g/dL — ABNORMAL LOW (ref 13.0–17.0)
MCH: 27.9 pg (ref 26.0–34.0)
MCHC: 31.1 g/dL (ref 30.0–36.0)
MCV: 89.7 fL (ref 78.0–100.0)
PLATELETS: 125 10*3/uL — AB (ref 150–400)
RBC: 3.69 MIL/uL — ABNORMAL LOW (ref 4.22–5.81)
RDW: 14.5 % (ref 11.5–15.5)
WBC: 6 10*3/uL (ref 4.0–10.5)

## 2017-01-25 LAB — BRAIN NATRIURETIC PEPTIDE: B Natriuretic Peptide: 219.4 pg/mL — ABNORMAL HIGH (ref 0.0–100.0)

## 2017-01-25 LAB — COMPREHENSIVE METABOLIC PANEL
ALK PHOS: 63 U/L (ref 38–126)
ALT: 16 U/L — ABNORMAL LOW (ref 17–63)
AST: 21 U/L (ref 15–41)
Albumin: 3.5 g/dL (ref 3.5–5.0)
Anion gap: 10 (ref 5–15)
BILIRUBIN TOTAL: 0.7 mg/dL (ref 0.3–1.2)
BUN: 42 mg/dL — AB (ref 6–20)
CALCIUM: 10.4 mg/dL — AB (ref 8.9–10.3)
CO2: 23 mmol/L (ref 22–32)
Chloride: 102 mmol/L (ref 101–111)
Creatinine, Ser: 1.82 mg/dL — ABNORMAL HIGH (ref 0.61–1.24)
GFR calc Af Amer: 41 mL/min — ABNORMAL LOW (ref >60)
GFR, EST NON AFRICAN AMERICAN: 35 mL/min — AB (ref >60)
Glucose, Bld: 143 mg/dL — ABNORMAL HIGH (ref 65–99)
Potassium: 5 mmol/L (ref 3.5–5.1)
Sodium: 135 mmol/L (ref 135–145)
Total Protein: 6.1 g/dL — ABNORMAL LOW (ref 6.5–8.1)

## 2017-01-25 LAB — I-STAT TROPONIN, ED: Troponin i, poc: 0.03 ng/mL (ref 0.00–0.08)

## 2017-01-25 LAB — MAGNESIUM: MAGNESIUM: 1.8 mg/dL (ref 1.7–2.4)

## 2017-01-25 LAB — LIPASE, BLOOD: Lipase: 55 U/L — ABNORMAL HIGH (ref 11–51)

## 2017-01-25 MED ORDER — DOXAZOSIN MESYLATE 2 MG PO TABS
8.0000 mg | ORAL_TABLET | Freq: Two times a day (BID) | ORAL | Status: DC
  Administered 2017-01-26 – 2017-01-28 (×5): 8 mg via ORAL
  Filled 2017-01-25 (×5): qty 4

## 2017-01-25 MED ORDER — VITAMIN B-12 1000 MCG PO TABS
500.0000 ug | ORAL_TABLET | Freq: Every day | ORAL | Status: DC
  Administered 2017-01-26 – 2017-01-28 (×3): 500 ug via ORAL
  Filled 2017-01-25 (×3): qty 1

## 2017-01-25 MED ORDER — MYCOPHENOLATE SODIUM 180 MG PO TBEC
360.0000 mg | DELAYED_RELEASE_TABLET | Freq: Two times a day (BID) | ORAL | Status: DC
  Administered 2017-01-26 – 2017-01-28 (×6): 360 mg via ORAL
  Filled 2017-01-25 (×7): qty 2

## 2017-01-25 MED ORDER — SODIUM CHLORIDE 0.9 % IV BOLUS (SEPSIS)
500.0000 mL | Freq: Once | INTRAVENOUS | Status: AC
  Administered 2017-01-25: 500 mL via INTRAVENOUS

## 2017-01-25 MED ORDER — ACETAMINOPHEN 650 MG RE SUPP: 650.0000 mg | Freq: Four times a day (QID) | RECTAL | Status: DC | PRN

## 2017-01-25 MED ORDER — ONDANSETRON HCL 4 MG PO TABS: 4.0000 mg | ORAL_TABLET | Freq: Four times a day (QID) | ORAL | Status: DC | PRN

## 2017-01-25 MED ORDER — ONDANSETRON HCL 4 MG/2ML IJ SOLN: 4.0000 mg | Freq: Four times a day (QID) | INTRAMUSCULAR | Status: DC | PRN

## 2017-01-25 MED ORDER — VITAMIN D 1000 UNITS PO TABS
1000.0000 [IU] | ORAL_TABLET | Freq: Every day | ORAL | Status: DC
  Administered 2017-01-26 – 2017-01-28 (×3): 1000 [IU] via ORAL
  Filled 2017-01-25 (×3): qty 1

## 2017-01-25 MED ORDER — HYDROCODONE-ACETAMINOPHEN 5-325 MG PO TABS: 1.0000 | ORAL_TABLET | ORAL | Status: DC | PRN

## 2017-01-25 MED ORDER — ACETAMINOPHEN 325 MG PO TABS: 650.0000 mg | ORAL_TABLET | Freq: Four times a day (QID) | ORAL | Status: DC | PRN

## 2017-01-25 MED ORDER — SENNOSIDES-DOCUSATE SODIUM 8.6-50 MG PO TABS: 1.0000 | ORAL_TABLET | Freq: Every evening | ORAL | Status: DC | PRN

## 2017-01-25 MED ORDER — ASPIRIN EC 81 MG PO TBEC
81.0000 mg | DELAYED_RELEASE_TABLET | Freq: Every day | ORAL | Status: DC
Start: 2017-01-26 — End: 2017-01-28
  Administered 2017-01-26 – 2017-01-28 (×3): 81 mg via ORAL
  Filled 2017-01-25 (×3): qty 1

## 2017-01-25 MED ORDER — AMIODARONE HCL 200 MG PO TABS
200.0000 mg | ORAL_TABLET | Freq: Every day | ORAL | Status: DC
  Administered 2017-01-26: 200 mg via ORAL
  Filled 2017-01-25: qty 1

## 2017-01-25 MED ORDER — CYCLOSPORINE MODIFIED (NEORAL) 25 MG PO CAPS
75.0000 mg | ORAL_CAPSULE | Freq: Two times a day (BID) | ORAL | Status: DC
  Administered 2017-01-26: 75 mg via ORAL
  Filled 2017-01-25 (×4): qty 3

## 2017-01-25 MED ORDER — PREDNISOLONE 5 MG PO TABS
5.0000 mg | ORAL_TABLET | Freq: Every day | ORAL | Status: DC
  Filled 2017-01-25 (×2): qty 1

## 2017-01-25 MED ORDER — SODIUM CHLORIDE 0.9 % IV SOLN
INTRAVENOUS | Status: AC
  Administered 2017-01-26 (×2): via INTRAVENOUS

## 2017-01-25 MED ORDER — PYRIDOXINE HCL 25 MG PO TABS
25.0000 mg | ORAL_TABLET | Freq: Every day | ORAL | Status: DC
  Administered 2017-01-26 – 2017-01-28 (×3): 25 mg via ORAL
  Filled 2017-01-25 (×3): qty 1

## 2017-01-25 NOTE — H&P (Signed)
History and Physical    Jeffrey Perez NOM:[id number] DOB: May 28, 1945 DOA: 01/25/2017  PCP: Patient, No Pcp Per; PCP is in [place], pt here visiting   Patient coming from: Home  Chief Complaint: Gen weakness   HPI: Jeffrey Perez is a 71 y.o. male with medical history significant for end-stage renal disease status post renal transplant approximately 25 years ago, hypertension, chronic anemia, and paroxysmal atrial fibrillation on warfarin, presenting to the emergency department for evaluation of generalized weakness.  Patient is here visiting from [place] state and reports that he has been generally weak for the past 2 weeks.  He denies any focal numbness or weakness, denies headache, denies change in vision or hearing, and denies loss of coordination.  Patient reports a mild decrease in his appetite, but has continued to eat as usual with the urging of his wife.  He denies vomiting or diarrhea.  No abdominal pain, dysuria, or flank pain.  No significant dyspnea or cough.  Denies fevers or chills.  He went to a walk-in clinic yesterday for evaluation of these complaints, basic blood work was obtained, and he was called back today with instructions to proceed to the emergency department for evaluation of an abnormal lab.  The specific lab of concern was not specified.  ED Course: Upon arrival to the ED, patient is found to be afebrile, saturating well on room air, and with vitals otherwise stable.  EKG features a sinus rhythm with nonspecific IVCD and PVCs.  Chest x-ray is notable for a vague right paratracheal opacity and basilar atelectasis.  Chemistry panel reveals a BUN of 42 and creatinine of 1.82, up from 1.46 in 2015 (the most recent available).  CBC features a chronic normocytic anemia with hemoglobin of 10.3 chronic thrombocytopenia with platelets 125,000.  Troponin is within normal limits and urinalysis is negative for hematuria or proteinuria and not suggestive of infection.  Patient was  treated with 500 cc normal saline in the ED.  He remained hemodynamically stable and in no respiratory distress.  He will be observed on the medical-surgical unit for ongoing evaluation and management of generalized weakness in a patient with renal transplant and immunosuppression.  Review of Systems:  All other systems reviewed and apart from HPI, are negative.  Past Medical History:  Diagnosis Date  . Anemia   . Hypertension     Past Surgical History:  Procedure Laterality Date  . KIDNEY TRANSPLANT       reports that he has never smoked. He does not have any smokeless tobacco history on file. He reports that he does not drink alcohol or use drugs.  No Known Allergies  History reviewed. No pertinent family history.   Prior to Admission medications   Medication Sig Start Date End Date Taking? Authorizing Provider  amiodarone (PACERONE) 200 MG tablet Take 200 mg by mouth daily.   Yes [provider]  aspirin 81 MG tablet Take 81 mg by mouth daily.   Yes [provider]  cholecalciferol (VITAMIN D) 1000 units tablet Take 1,000 Units by mouth daily.   Yes [provider]  COLCHICINE PO Take 0.6 mg by mouth daily as needed (gout).    Yes [provider]  Cyanocobalamin (VITAMIN B-12 PO) Take by mouth daily.   Yes [provider]  CycloSPORINE Modified (NEORAL PO) Take 75 mg by mouth 2 (two) times daily.    Yes [provider]  doxazosin (CARDURA) 8 MG tablet Take 8 mg by mouth 2 (two)  times daily.   Yes [provider]  Mycophenolate Sodium (MYFORTIC PO) Take 360 mg by mouth 2 (two) times daily.    Yes [provider]  prednisoLONE 5 MG TABS tablet Take 1 tablet (5 mg total) by mouth daily. 02/07/13  Yes Robbie Lis, MD  Pyridoxine HCl (VITAMIN B-6 PO) Take by mouth daily.   Yes [provider]  warfarin (COUMADIN) 6 MG tablet Take 6 mg by mouth daily.   Yes [provider]    Physical  Exam: Vitals:   01/25/17 1716 01/25/17 1932 01/25/17 2045 01/25/17 2100  BP: (!) 109/51 (!) 146/92 135/76 (!) 121/58  Pulse: 77 73 75 73  Resp: 16 18 15 14   Temp: 98.3 F (36.8 C)     TempSrc: Oral     SpO2: 94% 100% 100% 98%  Weight:      Height:          Constitutional: NAD, calm, listless Eyes: PERTLA, lids and conjunctivae normal ENMT: Mucous membranes are moist. Posterior pharynx clear of any exudate or lesions.   Neck: normal, supple, no masses, no thyromegaly Respiratory: clear to auscultation bilaterally, no wheezing, no crackles. Normal respiratory effort.   Cardiovascular: S1 & S2 heard, regular rate and rhythm. No extremity edema. No significant JVD. Abdomen: No distension, no tenderness, soft. Bowel sounds active.  Musculoskeletal: no clubbing / cyanosis. No joint deformity upper and lower extremities.   Skin: no significant rashes, lesions, ulcers. Poor turgor. Neurologic: CN 2-12 grossly intact. Sensation intact. Strength 5/5 in all 4 limbs.  Psychiatric: Alert and oriented x 3. Pleasant, cooperative.     Labs on Admission: I have personally reviewed following labs and imaging studies  CBC:  Recent Labs Lab 01/25/17 1728  WBC 6.0  HGB 10.3*  HCT 33.1*  MCV 89.7  PLT 284*   Basic Metabolic Panel:  Recent Labs Lab 01/25/17 1728  NA 135  K 5.0  CL 102  CO2 23  GLUCOSE 143*  BUN 42*  CREATININE 1.82*  CALCIUM 10.4*   GFR: Estimated Creatinine Clearance: 32.6 mL/min (A) (by C-G formula based on SCr of 1.82 mg/dL (H)). Liver Function Tests:  Recent Labs Lab 01/25/17 1728  AST 21  ALT 16*  ALKPHOS 63  BILITOT 0.7  PROT 6.1*  ALBUMIN 3.5    Recent Labs Lab 01/25/17 1728  LIPASE 55*   No results for input(s): AMMONIA in the last 168 hours. Coagulation Profile: No results for input(s): INR, PROTIME in the last 168 hours. Cardiac Enzymes: No results for input(s): CKTOTAL, CKMB, CKMBINDEX, TROPONINI in the last 168 hours. BNP (last 3  results) No results for input(s): PROBNP in the last 8760 hours. HbA1C: No results for input(s): HGBA1C in the last 72 hours. CBG: No results for input(s): GLUCAP in the last 168 hours. Lipid Profile: No results for input(s): CHOL, HDL, LDLCALC, TRIG, CHOLHDL, LDLDIRECT in the last 72 hours. Thyroid Function Tests: No results for input(s): TSH, T4TOTAL, FREET4, T3FREE, THYROIDAB in the last 72 hours. Anemia Panel: No results for input(s): VITAMINB12, FOLATE, FERRITIN, TIBC, IRON, RETICCTPCT in the last 72 hours. Urine analysis:    Component Value Date/Time   COLORURINE YELLOW 01/25/2017 1814   APPEARANCEUR CLEAR 01/25/2017 1814   LABSPEC 1.008 01/25/2017 1814   PHURINE 6.0 01/25/2017 1814   GLUCOSEU NEGATIVE 01/25/2017 1814   HGBUR NEGATIVE 01/25/2017 1814   BILIRUBINUR NEGATIVE 01/25/2017 1814   KETONESUR NEGATIVE 01/25/2017 1814   PROTEINUR NEGATIVE 01/25/2017 1814   UROBILINOGEN  0.2 05/10/2013 1015   NITRITE NEGATIVE 01/25/2017 1814   LEUKOCYTESUR NEGATIVE 01/25/2017 1814   Sepsis Labs: @LABRCNTIP (procalcitonin:4,lacticidven:4) )No results found for this or any previous visit (from the past 240 hour(s)).   Radiological Exams on Admission: Dg Chest 2 View  Result Date: 01/25/2017 CLINICAL DATA:  Fatigue and weakness EXAM: CHEST  2 VIEW COMPARISON:  None. FINDINGS: Post sternotomy changes. Linear atelectasis or scarring at both lung bases. No acute consolidation or effusion. Mild cardiomegaly. Ectatic tortuous aorta with atherosclerosis. No pneumothorax. Right paratracheal opacity. IMPRESSION: 1. Linear scarring or atelectasis at the bases 2. Mild cardiomegaly. Borderline to slightly enlarged mediastinum. In addition there is a vague right paratracheal opacity ; CT suggested for further evaluation. Electronically Signed   By: Donavan Foil M.D.   On: 01/25/2017 20:30    EKG: Independently reviewed. Sinus rhythm, non-specific IVCD, PVC's  Assessment/Plan  1. Generalized  weakness  - Pt presents with 2 wks of progressive generalized weakness and lethargy  - No focal deficit, and no s/s of infectious process  - Labs appear fairly stable from our most recent available priors from 2015  - Pt appears fatigued, but not acutely ill   - Check TSH, serum CK, ESR, CRP, check hepatitis C given demographics, check HIV as no record of testing  2. Hx of renal transplant, renal insufficiency  - Pt presents with generalized weakness, noted to have SCr of 1.82, up from prior value of 1.46 in 2015  - Baseline GFR not clear, but there is no hypertension or proteinuria to suggest rejection  - He was treated with 500 cc NS in ED and will be continued on IVF hydration  - Repeat chemistries in am   3. Anemia, thrombocytopenia - Hgb is 10.3 and platelets 125,000  - No bleeding evident, likely secondary to chronic disease, continue to supplement iron and B-vitamins    4. Paroxysmal atrial fibrillation  - In a sinus rhythm on admission  - CHADS-VASc is at least 2 (age, HTN)  - Check INR and continue warfarin, continue amiodarone    DVT prophylaxis: warfarin  Code Status: Full  Family Communication: Wife updated at bedside Disposition Plan: Observe on med-surg Consults called: None Admission status: Observation    Vianne Bulls, MD [facility] Pager [phone]  If 7PM-7AM, please contact night-coverage [email or web address] Password [facility]  01/25/2017, 9:32 PM

## 2017-01-25 NOTE — ED Triage Notes (Signed)
Pt reports going to [facility] yesterday due to fatigue and weakness. Denies any pain. Does having dark black stools but takes iron. Has hx of anemia and blood transfusions. Pt was called today and was told something was abnormal on blood work but pt is unsure of what exact test was abnormal. Appears pale at triage, no acute distress is noted.

## 2017-01-25 NOTE — ED Provider Notes (Signed)
Emergency Department Provider Note   I have reviewed the triage vital signs and the nursing notes.   HISTORY  Chief Complaint Abnormal Lab   HPI Jeffrey Perez is a 71 y.o. male with PMH of HTN, anemia, and h/o renal transplant presents to the emergency department for evaluation of abnormal he was seen yesterday by his primary care physician for fatigue, weakness, increasing shortness of breath.  Labs were drawn and the patient was called today saying that he had abnormal blood work and that he should present to the emergency department for further evaluation.  Patient was not told which lab values were abnormal.  He notes that his symptoms have been gradually worsening over the past 1-2 weeks.  He denies any associated chest pain.  He continues to make urine.  No fevers or chills.  Has had some dark stools with iron supplementation but nothing out of the ordinary for the patient.    Past Medical History:  Diagnosis Date  . Anemia   . Hypertension     Patient Active Problem List   Diagnosis Date Noted  . Normocytic anemia 01/25/2017  . Hypertension 06/06/2012  . History of renal transplant 06/06/2012  . Chronic anticoagulation 06/06/2012  . Paroxysmal atrial fibrillation ([place]) 05/30/2012  . Gout 05/30/2012    Past Surgical History:  Procedure Laterality Date  . KIDNEY TRANSPLANT      Current Outpatient Rx  . Order #: [id number] Class: Historical Med  . Order #: [id number] Class: Historical Med  . Order #: [id number] Class: Historical Med  . Order #: [id number] Class: Historical Med  . Order #: [id number] Class: Historical Med  . Order #: [id number] Class: Historical Med  . Order #: [id number] Class: Historical Med  . Order #: [id number] Class: Historical Med  . Order #: [id number] Class: Print  . Order #: [id number] Class: Historical Med  . Order #: [id number] Class: Historical Med  . Order #: [id number] Class: Normal  . Order #: [id number] Class: Normal    Allergies Patient has no known  allergies.  History reviewed. No pertinent family history.  Social History Social History  Substance Use Topics  . Smoking status: Never Smoker  . Smokeless tobacco: Not on file  . Alcohol use No    Review of Systems  Constitutional: No fever/chills. Positive generalized fatigue.  Eyes: No visual changes. ENT: No sore throat. Cardiovascular: Denies chest pain. Respiratory: Positive shortness of breath. Gastrointestinal: No abdominal pain.  No nausea, no vomiting.  No diarrhea.  No constipation. Genitourinary: Negative for dysuria. Musculoskeletal: Negative for back pain. Skin: Negative for rash. Neurological: Negative for headaches, focal weakness or numbness.  10-point ROS otherwise negative.  ____________________________________________   PHYSICAL EXAM:  VITAL SIGNS: ED Triage Vitals  Enc Vitals Group     BP 01/25/17 1716 (!) 109/51     Pulse Rate 01/25/17 1716 77     Resp 01/25/17 1716 16     Temp 01/25/17 1716 98.3 F (36.8 C)     Temp Source 01/25/17 1716 Oral     SpO2 01/25/17 1716 94 %     Weight 01/25/17 1715 149 lb (67.6 kg)     Height 01/25/17 1715 5\' 6"  (1.676 m)   Constitutional: Alert and oriented. Well appearing and in no acute distress. Eyes: Conjunctivae are normal.  Head: Atraumatic. Nose: No congestion/rhinnorhea. Mouth/Throat: Mucous membranes are moist.  Neck: No stridor.   Cardiovascular: Normal rate, regular rhythm. Good peripheral circulation. Grossly normal heart sounds.   Respiratory: Normal respiratory effort.  No retractions. Lungs CTAB. Gastrointestinal: Soft and  nontender. No distention.  Musculoskeletal: No lower extremity tenderness nor edema. No gross deformities of extremities. Neurologic:  Normal speech and language. No gross focal neurologic deficits are appreciated.  Skin:  Skin is warm, dry and intact. No rash noted.  ____________________________________________   LABS (all labs ordered are listed, but only abnormal  results are displayed)  Labs Reviewed  LIPASE, BLOOD - Abnormal; Notable for the following:       Result Value   Lipase 55 (*)    All other components within normal limits  COMPREHENSIVE METABOLIC PANEL - Abnormal; Notable for the following:    Glucose, Bld 143 (*)    BUN 42 (*)    Creatinine, Ser 1.82 (*)    Calcium 10.4 (*)    Total Protein 6.1 (*)    ALT 16 (*)    GFR calc non Af Amer 35 (*)    GFR calc Af Amer 41 (*)    All other components within normal limits  CBC - Abnormal; Notable for the following:    RBC 3.69 (*)    Hemoglobin 10.3 (*)    HCT 33.1 (*)    Platelets 125 (*)    All other components within normal limits  BRAIN NATRIURETIC PEPTIDE - Abnormal; Notable for the following:    B Natriuretic Peptide 219.4 (*)    All other components within normal limits  URINALYSIS, ROUTINE W REFLEX MICROSCOPIC  MAGNESIUM  I-STAT TROPONIN, ED   ____________________________________________  EKG   EKG Interpretation  Date/Time:  Wednesday January 25 2017 20:38:08 EDT Ventricular Rate:  89 PR Interval:    QRS Duration: 163 QT Interval:  453 QTC Calculation: 496 R Axis:   -21 Text Interpretation:  Sinus rhythm Supraventricular bigeminy IVCD, consider atypical RBBB No STEMI.  Confirmed by Nanda Quinton [phone]) on 01/25/2017 8:46:10 PM       ____________________________________________  RADIOLOGY  Dg Chest 2 View  Result Date: 01/25/2017 CLINICAL DATA:  Fatigue and weakness EXAM: CHEST  2 VIEW COMPARISON:  None. FINDINGS: Post sternotomy changes. Linear atelectasis or scarring at both lung bases. No acute consolidation or effusion. Mild cardiomegaly. Ectatic tortuous aorta with atherosclerosis. No pneumothorax. Right paratracheal opacity. IMPRESSION: 1. Linear scarring or atelectasis at the bases 2. Mild cardiomegaly. Borderline to slightly enlarged mediastinum. In addition there is a vague right paratracheal opacity ; CT suggested for further evaluation.  Electronically Signed   By: Donavan Foil M.D.   On: 01/25/2017 20:30    ____________________________________________   PROCEDURES  Procedure(s) performed:   Procedures  None ____________________________________________   INITIAL IMPRESSION / ASSESSMENT AND PLAN / ED COURSE  Pertinent labs & imaging results that were available during my care of the patient were reviewed by me and considered in my medical decision making (see chart for details).  Patient presents to the emergency department for evaluation generalized weakness with shortness of breath worsening over the past 1-2 weeks.  He went to an outside walk-in clinic who called him back today saying that some of his lab results were abnormal.  In comparison to prior his creatinine today is slightly elevated along with BUN.  Chest x-ray shows a slightly enlarged recommendation for follow-up CT scan.  Patient cannot have contrast so we will defer this to the hospitalist.  Given the patient's solitary, renal transplant I am concerned with his symptoms in conjunction with elevated creatinine.  Plan to ask the hospitalist to admit overnight for IV fluids and recheck of creatinine in the morning to ensure  it is not continuing to rise.  Patient follows with a nephrologist in [place] and will not see them again until December. No local PCP.   Discussed patient's case with Hospitalist, Dr. Myna Hidalgo to request admission. Patient and family (if present) updated with plan. Care transferred to Hospitalist service.  I reviewed all nursing notes, vitals, pertinent old records, EKGs, labs, imaging (as available).   ____________________________________________  FINAL CLINICAL IMPRESSION(S) / ED DIAGNOSES  Final diagnoses:  AKI (acute kidney injury) ([place])     MEDICATIONS GIVEN DURING THIS VISIT:  Medications  sodium chloride 0.9 % bolus 500 mL (0 mLs Intravenous Stopped 01/25/17 2037)     NEW OUTPATIENT MEDICATIONS STARTED DURING THIS  VISIT:  None  Note:  This document was prepared using Dragon voice recognition software and may include unintentional dictation errors.  Nanda Quinton, MD Emergency Medicine    Long, [facility], MD 01/25/17 [phone]

## 2017-01-26 ENCOUNTER — Observation Stay (HOSPITAL_COMMUNITY): Payer: Medicare Other

## 2017-01-26 ENCOUNTER — Encounter (HOSPITAL_COMMUNITY): Payer: Self-pay | Admitting: General Practice

## 2017-01-26 DIAGNOSIS — I480 Paroxysmal atrial fibrillation: Secondary | ICD-10-CM | POA: Diagnosis not present

## 2017-01-26 DIAGNOSIS — R531 Weakness: Secondary | ICD-10-CM | POA: Diagnosis not present

## 2017-01-26 DIAGNOSIS — N179 Acute kidney failure, unspecified: Secondary | ICD-10-CM

## 2017-01-26 DIAGNOSIS — D696 Thrombocytopenia, unspecified: Secondary | ICD-10-CM | POA: Diagnosis not present

## 2017-01-26 DIAGNOSIS — D649 Anemia, unspecified: Secondary | ICD-10-CM

## 2017-01-26 DIAGNOSIS — I10 Essential (primary) hypertension: Secondary | ICD-10-CM

## 2017-01-26 DIAGNOSIS — N289 Disorder of kidney and ureter, unspecified: Secondary | ICD-10-CM

## 2017-01-26 DIAGNOSIS — Z940 Kidney transplant status: Secondary | ICD-10-CM | POA: Diagnosis not present

## 2017-01-26 LAB — CBC
HEMATOCRIT: 30.1 % — AB (ref 39.0–52.0)
Hemoglobin: 9.6 g/dL — ABNORMAL LOW (ref 13.0–17.0)
MCH: 28.6 pg (ref 26.0–34.0)
MCHC: 31.9 g/dL (ref 30.0–36.0)
MCV: 89.6 fL (ref 78.0–100.0)
Platelets: 117 10*3/uL — ABNORMAL LOW (ref 150–400)
RBC: 3.36 MIL/uL — ABNORMAL LOW (ref 4.22–5.81)
RDW: 14.6 % (ref 11.5–15.5)
WBC: 5.5 10*3/uL (ref 4.0–10.5)

## 2017-01-26 LAB — C-REACTIVE PROTEIN: CRP: 1.7 mg/dL — ABNORMAL HIGH (ref <1.0)

## 2017-01-26 LAB — BASIC METABOLIC PANEL
ANION GAP: 11 (ref 5–15)
ANION GAP: 8 (ref 5–15)
BUN: 39 mg/dL — AB (ref 6–20)
BUN: 37 mg/dL — ABNORMAL HIGH (ref 6–20)
CALCIUM: 10.2 mg/dL (ref 8.9–10.3)
CHLORIDE: 111 mmol/L (ref 101–111)
CO2: 23 mmol/L (ref 22–32)
CO2: 22 mmol/L (ref 22–32)
CREATININE: 1.64 mg/dL — AB (ref 0.61–1.24)
Calcium: 10 mg/dL (ref 8.9–10.3)
Chloride: 107 mmol/L (ref 101–111)
Creatinine, Ser: 1.75 mg/dL — ABNORMAL HIGH (ref 0.61–1.24)
GFR calc Af Amer: 43 mL/min — ABNORMAL LOW (ref >60)
GFR calc non Af Amer: 40 mL/min — ABNORMAL LOW (ref >60)
GFR, EST AFRICAN AMERICAN: 46 mL/min — AB (ref >60)
GFR, EST NON AFRICAN AMERICAN: 37 mL/min — AB (ref >60)
Glucose, Bld: 102 mg/dL — ABNORMAL HIGH (ref 65–99)
Glucose, Bld: 91 mg/dL (ref 65–99)
POTASSIUM: 4.1 mmol/L (ref 3.5–5.1)
POTASSIUM: 3.8 mmol/L (ref 3.5–5.1)
SODIUM: 141 mmol/L (ref 135–145)
SODIUM: 141 mmol/L (ref 135–145)

## 2017-01-26 LAB — LIPASE, BLOOD: LIPASE: 52 U/L — AB (ref 11–51)

## 2017-01-26 LAB — CK: CK TOTAL: 27 U/L — AB (ref 49–397)

## 2017-01-26 LAB — PROTIME-INR
INR: 1.37
INR: 1.41
Prothrombin Time: 16.8 seconds — ABNORMAL HIGH (ref 11.4–15.2)
Prothrombin Time: 17.1 seconds — ABNORMAL HIGH (ref 11.4–15.2)

## 2017-01-26 LAB — SEDIMENTATION RATE: SED RATE: 26 mm/h — AB (ref 0–16)

## 2017-01-26 LAB — TSH: TSH: 1.39 u[IU]/mL (ref 0.350–4.500)

## 2017-01-26 MED ORDER — WARFARIN SODIUM 6 MG PO TABS
6.0000 mg | ORAL_TABLET | Freq: Once | ORAL | Status: AC
  Administered 2017-01-26: 6 mg via ORAL
  Filled 2017-01-26: qty 1

## 2017-01-26 MED ORDER — PREDNISONE 10 MG PO TABS
10.0000 mg | ORAL_TABLET | Freq: Every day | ORAL | Status: AC
  Administered 2017-01-26 – 2017-01-28 (×3): 10 mg via ORAL
  Filled 2017-01-26 (×3): qty 1

## 2017-01-26 MED ORDER — PREDNISONE 5 MG PO TABS
5.0000 mg | ORAL_TABLET | Freq: Every day | ORAL | Status: DC
Start: 2017-01-29 — End: 2017-01-28

## 2017-01-26 MED ORDER — TACROLIMUS 1 MG PO CAPS
2.0000 mg | ORAL_CAPSULE | Freq: Two times a day (BID) | ORAL | Status: DC
  Administered 2017-01-26 – 2017-01-28 (×5): 2 mg via ORAL
  Filled 2017-01-26 (×5): qty 2

## 2017-01-26 MED ORDER — PREDNISONE 5 MG PO TABS: 5.0000 mg | ORAL_TABLET | Freq: Every day | ORAL | Status: DC

## 2017-01-26 MED ORDER — WARFARIN SODIUM 10 MG PO TABS
10.0000 mg | ORAL_TABLET | ORAL | Status: AC
  Administered 2017-01-26: 10 mg via ORAL
  Filled 2017-01-26: qty 1

## 2017-01-26 MED ORDER — WARFARIN - PHARMACIST DOSING INPATIENT
Freq: Every day | Status: DC
  Administered 2017-01-27: 18:00:00

## 2017-01-26 NOTE — Care Management Note (Signed)
Case Management Note  Patient Details  Name: Jeffrey Perez MRN: [id number] Date of Birth: [birth year]  Subjective/Objective:      CM following for progression and d/c planning.               Action/Plan: 01/26/2017 Met with pt who is currently living in [place] however will return to [place] before Thanksgiving. Pt states that he travels back and forth between [place] and [place]. Pt has been doing this for some time , however has never established a PCP in [place]. This CM has provided him with the phone number for [facility] as the pt has insurance and discussed using [facility] once he is d/c to locate a local PCP.  OBS status also discussed and pt verbalized understanding.  No HH or DME per pt at this time.   Expected Discharge Date:     01/27/2017             Expected Discharge Plan:  Home/Self Care  In-House Referral:  NA  Discharge planning Services  CM Consult  Post Acute Care Choice:  NA Choice offered to:  NA  DME Arranged:  N/A DME Agency:  NA  HH Arranged:  NA HH Agency:  NA  Status of Service:  Completed, signed off  If discussed at [place] of Stay Meetings, dates discussed:    Additional Comments:  Adron Bene, RN 01/26/2017, 12:18 PM

## 2017-01-26 NOTE — Evaluation (Signed)
Physical Therapy Evaluation Patient Details Name: Wayde Gopaul MRN: [id number] DOB: [birth year] Today's Date: 01/26/2017   History of Present Illness  71 yo male who is from [place] was admitted with weakness and abnormal labs from PCP's office.  Has AKI, dramatic gouty joint changes and continual pain, poor sleep and is kidney transplant patient.  PMHx: anemia, HTN, PAF, gout, cardiomegaly,   Clinical Impression  Pt was seen for evaluation of his mobility and noted his pulse after gait 104, had some dizziness that was more controlled with use of RW.  Pt attributes his symptoms to being in bed but diastolic numbers have been low.  Pt is more secure with RW and anticipate his discharge with RW and HHPT follow up to transition to outpatient possibly.      Follow Up Recommendations Home health PT;Supervision for mobility/OOB    Equipment Recommendations  Rolling walker with 5" wheels    Recommendations for Other Services       Precautions / Restrictions Precautions Precautions: Fall Restrictions Weight Bearing Restrictions: No      Mobility  Bed Mobility Overal bed mobility: Modified Independent             General bed mobility comments: increased time to deal with chronic pain in elbows and shoulders  Transfers Overall transfer level: Modified independent Equipment used: Rolling walker (2 wheeled);1 person hand held assist             General transfer comment: has pain in his elbows and L shoulder  Ambulation/Gait Ambulation/Gait assistance: Min guard;Min assist Ambulation Distance (Feet): 100 Feet Assistive device: Rolling walker (2 wheeled);1 person hand held assist Gait Pattern/deviations: Step-through pattern;Step-to pattern;Decreased stride length;Trunk flexed;Wide base of support;Drifts right/left Gait velocity: reduced Gait velocity interpretation: Below normal speed for age/gender General Gait Details: moves carefully with shifting on the hallway  sideways  Stairs            Wheelchair Mobility    Modified Rankin (Stroke Patients Only)       Balance Overall balance assessment: Needs assistance Sitting-balance support: Feet supported Sitting balance-Leahy Scale: Fair     Standing balance support: Bilateral upper extremity supported;During functional activity Standing balance-Leahy Scale: Poor Standing balance comment: depends on RW for standing support                             Pertinent Vitals/Pain Pain Assessment: Faces Faces Pain Scale: Hurts even more Pain Location: multiple joints with pain and gouty changes Pain Descriptors / Indicators: Sore;Tender Pain Intervention(s): Monitored during session;Repositioned    Home Living Family/patient expects to be discharged to:: Private residence Living Arrangements: Spouse/significant other Available Help at Discharge: Family;Available 24 hours/day Type of Home: House Home Access: Level entry     Home Layout: One level Home Equipment: None      Prior Function Level of Independence: Independent               Hand Dominance   Dominant Hand: Right    Extremity/Trunk Assessment   Upper Extremity Assessment Upper Extremity Assessment: Overall WFL for tasks assessed    Lower Extremity Assessment Lower Extremity Assessment: Generalized weakness    Cervical / Trunk Assessment Cervical / Trunk Assessment: Normal  Communication   Communication: No difficulties;Other (comment) (has accent but fully understands [place])  Cognition Arousal/Alertness: Awake/alert Behavior During Therapy: WFL for tasks assessed/performed Overall Cognitive Status: Within Functional Limits for tasks assessed  General Comments      Exercises     Assessment/Plan    PT Assessment Patient needs continued PT services  PT Problem List Decreased strength;Decreased range of motion;Decreased activity  tolerance;Decreased balance;Decreased mobility;Decreased coordination;Decreased knowledge of use of DME;Decreased safety awareness;Cardiopulmonary status limiting activity;Decreased skin integrity;Pain       PT Treatment Interventions DME instruction;Gait training;Functional mobility training;Therapeutic activities;Therapeutic exercise;Balance training;Neuromuscular re-education;Patient/family education    PT Goals (Current goals can be found in the Care Plan section)  Acute Rehab PT Goals Patient Stated Goal: to walk and feel better PT Goal Formulation: With patient Time For Goal Achievement: 02/09/17 Potential to Achieve Goals: Good    Frequency Min 2X/week   Barriers to discharge   needs continual supervised help with all mobility     Co-evaluation               AM-PAC PT "6 Clicks" Daily Activity  Outcome Measure Difficulty turning over in bed (including adjusting bedclothes, sheets and blankets)?: A Little Difficulty moving from lying on back to sitting on the side of the bed? : A Little Difficulty sitting down on and standing up from a chair with arms (e.g., wheelchair, bedside commode, etc,.)?: A Little Help needed moving to and from a bed to chair (including a wheelchair)?: A Little Help needed walking in hospital room?: A Little Help needed climbing 3-5 steps with a railing? : A Lot 6 Click Score: 17    End of Session Equipment Utilized During Treatment: Gait belt Activity Tolerance: Patient tolerated treatment well;Patient limited by fatigue;Other (comment) (dizzy complaints) Patient left: in chair;with call bell/phone within reach Nurse Communication: Mobility status PT Visit Diagnosis: Unsteadiness on feet (R26.81);Other abnormalities of gait and mobility (R26.89);Ataxic gait (R26.0)    Time: 1914-7829 PT Time Calculation (min) (ACUTE ONLY): 15 min   Charges:   PT Evaluation $PT Eval Moderate Complexity: 1 Mod     PT G Codes:   PT G-Codes **NOT FOR  INPATIENT CLASS** Functional Assessment Tool Used: AM-PAC 6 Clicks Basic Mobility;Clinical judgement Functional Limitation: Mobility: Walking and moving around Mobility: Walking and Moving Around Current Status (F6213): At least 40 percent but less than 60 percent impaired, limited or restricted Mobility: Walking and Moving Around Goal Status [phone]): At least 1 percent but less than 20 percent impaired, limited or restricted    Ramond Dial 01/26/2017, 1:28 PM   Mee Hives, PT MS Acute Rehab Dept. Number: [place] [facility] [place]

## 2017-01-26 NOTE — Progress Notes (Signed)
ANTICOAGULATION CONSULT NOTE - Follow-Up  Pharmacy Consult for Warfarin Indication: atrial fibrillation  No Known Allergies  Patient Measurements: Height: 5\' 6"  (167.6 cm) Weight: 149 lb (67.6 kg) IBW/kg (Calculated) : 63.8  Vital Signs: Temp: 98 F (36.7 C) (10/25 1000) Temp Source: Oral (10/25 1000) BP: 132/57 (10/25 1000) Pulse Rate: 82 (10/25 1000)  Labs:  Recent Labs  01/25/17 1728 01/25/17 2341 01/26/17 0403 01/26/17 1054  HGB 10.3*  --  9.6*  --   HCT 33.1*  --  30.1*  --   PLT 125*  --  117*  --   LABPROT  --  16.8* 17.1*  --   INR  --  1.37 1.41  --   CREATININE 1.82*  --  1.75* 1.64*  CKTOTAL  --  27*  --   --     Estimated Creatinine Clearance: 36.2 mL/min (A) (by C-G formula based on SCr of 1.64 mg/dL (H)).  Assessment: 37 YOM who presented on 10/24 with generalized weakness. The patient was on warfarin PTA for hx Afib - admit INR 1.37 on PTA dose of 6 mg/day. Pharmacy consulted to resume dosing this admission.   The patient received a higher warfarin dose of 10 mg x 1 at 0200 this AM. INR was drawn with morning labs at 0400 and resulted as SUBtherapeutic (INR 1.41 << 1.37, goal of 2-3). The INR does not reflect the effects of this morning dose yet so will dose conservatively with the patient's home dose today until effects on INR can be seen.   Goal of Therapy:  INR 2-3   Plan:  1. Warfarin 6 mg x 1 dose at 1800 today 2. Will continue to monitor for any signs/symptoms of bleeding and will follow up with PT/INR in the a.m.   Thank you for allowing pharmacy to be a part of this patient's care.  Alycia Rossetti, PharmD, BCPS Clinical Pharmacist Pager: [phone] Clinical phone for 01/26/2017 from 7a-3:30p: [phone] If after 3:30p, please call main pharmacy at: [phone] 01/26/2017 11:47 AM

## 2017-01-26 NOTE — Progress Notes (Signed)
ANTICOAGULATION CONSULT NOTE - Initial Consult  Pharmacy Consult for heparin Indication: atrial fibrillation  No Known Allergies  Patient Measurements: Height: 5\' 6"  (167.6 cm) Weight: 149 lb (67.6 kg) IBW/kg (Calculated) : 63.8  Vital Signs: Temp: 97.5 F (36.4 C) (10/25 0001) Temp Source: Oral (10/25 0001) BP: 151/109 (10/25 0001) Pulse Rate: 88 (10/25 0001)  Labs:  Recent Labs  01/25/17 1728 01/25/17 2341  HGB 10.3*  --   HCT 33.1*  --   PLT 125*  --   LABPROT  --  16.8*  INR  --  1.37  CREATININE 1.82*  --   CKTOTAL  --  27*    Estimated Creatinine Clearance: 32.6 mL/min (A) (by C-G formula based on SCr of 1.82 mg/dL (H)).   Medical History: Past Medical History:  Diagnosis Date  . Anemia   . Hypertension     Medications:  Prescriptions Prior to Admission  Medication Sig Dispense Refill Last Dose  . amiodarone (PACERONE) 200 MG tablet Take 200 mg by mouth daily.   01/25/2017 at Unknown time  . aspirin 81 MG tablet Take 81 mg by mouth daily.   01/25/2017 at Unknown time  . cholecalciferol (VITAMIN D) 1000 units tablet Take 1,000 Units by mouth daily.   01/25/2017 at Unknown time  . COLCHICINE PO Take 0.6 mg by mouth daily as needed (gout).    prn  . Cyanocobalamin (VITAMIN B-12 PO) Take by mouth daily.   01/25/2017 at Unknown time  . CycloSPORINE Modified (NEORAL PO) Take 75 mg by mouth 2 (two) times daily.    01/25/2017 at Unknown time  . doxazosin (CARDURA) 8 MG tablet Take 8 mg by mouth 2 (two) times daily.   01/25/2017 at Unknown time  . Mycophenolate Sodium (MYFORTIC PO) Take 360 mg by mouth 2 (two) times daily.    01/25/2017 at Unknown time  . prednisoLONE 5 MG TABS tablet Take 1 tablet (5 mg total) by mouth daily. 90 each 6 01/25/2017 at Unknown time  . Pyridoxine HCl (VITAMIN B-6 PO) Take by mouth daily.   01/25/2017 at Unknown time  . warfarin (COUMADIN) 6 MG tablet Take 6 mg by mouth daily.   01/24/2017 at 1030pm   Scheduled:  . amiodarone  200  mg Oral Daily  . aspirin EC  81 mg Oral Daily  . cholecalciferol  1,000 Units Oral Daily  . cycloSPORINE modified  75 mg Oral BID  . doxazosin  8 mg Oral BID  . mycophenolate  360 mg Oral BID  . prednisoLONE  5 mg Oral Daily  . vitamin B-6  25 mg Oral Daily  . vitamin B-12  500 mcg Oral Daily   Infusions:  . sodium chloride 110 mL/hr at 01/26/17 0005    Assessment: 71yo male admitted for generalized weakness, to continue Coumadin for Afib; current INR below goal w/ last dose taken 10/23.  Goal of Therapy:  INR 2-3   Plan:  Will give boosted Coumadin dose of 10mg  now and monitor INR for dose adjustments.  Wynona Neat, PharmD, BCPS  01/26/2017,12:55 AM

## 2017-01-26 NOTE — Care Management Obs Status (Signed)
[facility] NOTIFICATION   Patient Details  Name: Torrez Renfroe MRN: [id number] Date of Birth: Nov 18, 1945   Medicare Observation Status Notification Given:  Yes    Vernisha Bacote, Rory Percy, RN 01/26/2017, 12:09 PM

## 2017-01-26 NOTE — Progress Notes (Signed)
PROGRESS NOTE    Jeffrey Perez  FFM:[id number] DOB: 1945-04-18 DOA: 01/25/2017 PCP: Patient, No Pcp Per  Brief Narrative:  Jeffrey Perez is a 71 y.o. male with medical history significant for end-stage renal disease status post renal transplant approximately 25 years ago, hypertension, chronic anemia, and paroxysmal atrial fibrillation on warfarin, presenting to the emergency department for evaluation of generalized weakness.  Patient is here visiting from [place] state and reports that he has been generally weak for the past 2 weeks.  He denies any focal numbness or weakness, denies headache, denies change in vision or hearing, and denies loss of coordination.  Patient reports a mild decrease in his appetite, but has continued to eat as usual with the urging of his wife.  He denies vomiting or diarrhea.  No abdominal pain, dysuria, or flank pain.  No significant dyspnea or cough.  Denies fevers or chills.  He went to a walk-in clinic yesterday for evaluation of these complaints, basic blood work was obtained, and he was called back today with instructions to proceed to the emergency department for evaluation of an abnormal lab. The specific lab of concern was not specified.  Upon arrival to the ED, patient was found to be afebrile, saturating well on room air, and with vitals otherwise stable.  Chemistry panel reveals a BUN of 42 and creatinine of 1.82, up from 1.46 in 2015 (the most recent available).  He remained hemodynamically stable and in no respiratory distress.  He was placed  on the medical-surgical unit for ongoing evaluation and management of generalized weakness in a patient with renal transplant and immunosuppression.   Assessment & Plan:   Principal Problem:   General weakness Active Problems:   Paroxysmal atrial fibrillation (HCC)   Hypertension   History of renal transplant   Normocytic anemia   Thrombocytopenia (HCC)   Renal insufficiency  1. Generalized weakness possibly  2/2 to Adrenal Insuffiencey  - Pt presented with 2 wks of progressive generalized weakness and lethargy  - No focal deficit, and no s/s of infectious process; Afebrile and no WBC - Labs appear fairly stable from our most recent available priors from 2015  - Pt appears fatigued, but not acutely ill ; ?Adrenal insuffiencey - Checked TSH 1.390, serum CK 27 , ESR 26, CRP 1.7,  - PT recommending Home Health - ?Adrenal Insufficiency so will double Prednisone Dose to 10 mg po daily for 3 days and then go back to 5 mg po Daily   2. ESRD s/p Renal transplant, renal insufficiency  - Pt presented with generalized weakness, noted to have SCr of 1.82, up from prior value of 1.46 in 2015  - Baseline GFR not clear, but there is no hypertension or proteinuria to suggest rejection  - He was treated with 500 cc NS in ED and will be continued on IVF hydration; BUN/Cr improving  - Renal U/S showed No hydronephrosis or discrete abnormality of the pelvic transplant kidney aside from a 1.8 cm in long axis simple appearing Cyst. The native kidneys are severely atrophic and echogenic There is slight luminal irregularity in the urinary bladder diffusely, favoring nondistention over cystitis, correlate with urine analysis. - Urinalysis Clear - C/w Transplant Meds; Were wrong on Admission but now corrected and patient takes Mycophenalate 360 mg po BID, Tacrolimus 2 mg po BID, and Prednisone 5 mg po Daily   3. Anemia, thrombocytopenia - Hgb is 10.3 and platelets 125,000 on admission and now 9.6 and 117,000 respectively  - No  bleeding evident, likely secondary to chronic disease, continue to supplement iron and B-vitamins    4. Paroxysmal atrial fibrillation  - In a sinus rhythm on admission  - CHADS-VASc is at least 2 (age, HTN)  - Check INR and continue warfarin with Pharmacy to dose as INR was subtherapeutic - Patient no longer taking Amiodarone  5. Right Paratracheal Opacity -Seen on CXR -Obtained CT Chest  w/o Contrast and Right paratracheal Density was a tortuous innonomate artery   DVT prophylaxis: Anticoagulated with Coumadin and Heparin gtt Code Status: FULL CODE  Family Communication: No family present at bedside Disposition Plan: Home Health PT with RW  Consultants:   Discussed with Nephrology Dr. Jimmy Footman informally   Procedures: None   Antimicrobials: Anti-infectives    None     Subjective: Seen and examined and stated he still felt weak and fatigued. No CP or SOB. No problems urinating.   Objective: Vitals:   01/26/17 0001 01/26/17 0450 01/26/17 1000 01/26/17 1859  BP: (!) 151/109 (!) 129/54 (!) 132/57 137/72  Pulse: 88 80 82 95  Resp: 17 20 18 18   Temp: (!) 97.5 F (36.4 C) 98 F (36.7 C) 98 F (36.7 C) 97.6 F (36.4 C)  TempSrc: Oral Oral Oral Oral  SpO2: 95% 95% 96% 99%  Weight:      Height: 5' 6"  (1.676 m)       Intake/Output Summary (Last 24 hours) at 01/26/17 2114 Last data filed at 01/26/17 1300  Gross per 24 hour  Intake           690.83 ml  Output             1200 ml  Net          -509.17 ml   Filed Weights   01/25/17 1715  Weight: 67.6 kg (149 lb)   Examination: Physical Exam:  Constitutional: WN/WD, NAD and appears calm and comfortable Eyes: Lids and conjunctivae normal, sclerae anicteric  ENMT: External Ears, Nose appear normal. Grossly normal hearing. Mucous membranes are moist.  Neck: Appears normal, supple, no cervical masses, normal ROM, no appreciable thyromegaly, no JVD Respiratory: Clear to auscultation bilaterally, no wheezing, rales, rhonchi or crackles. Normal respiratory effort and patient is not tachypenic. No accessory muscle use.  Cardiovascular: RRR, no murmurs / rubs / gallops. S1 and S2 auscultated. No extremity edema.  Abdomen: Soft, non-tender, non-distended. No masses palpated. No appreciable hepatosplenomegaly. Bowel sounds positive.  GU: Deferred. Musculoskeletal: No clubbing / cyanosis of digits/nails. No joint  deformity upper and lower extremities. Good ROM, no contractures.  Skin: No rashes, lesions, ulcers on a limited skin eval. No induration; Warm and dry.  Neurologic: CN 2-12 grossly intact with no focal deficits. Sensation intact in all 4 Extremities. Romberg sign cerebellar reflexes not assessed.  Psychiatric: Normal judgment and insight. Alert and oriented x 3. Normal mood and appropriate affect.   Data Reviewed: I have personally reviewed following labs and imaging studies  CBC:  Recent Labs Lab 01/25/17 1728 01/26/17 0403  WBC 6.0 5.5  HGB 10.3* 9.6*  HCT 33.1* 30.1*  MCV 89.7 89.6  PLT 125* 540*   Basic Metabolic Panel:  Recent Labs Lab 01/25/17 1728 01/25/17 2046 01/26/17 0403 01/26/17 1054  NA 135  --  141 141  K 5.0  --  4.1 3.8  CL 102  --  107 111  CO2 23  --  23 22  GLUCOSE 143*  --  102* 91  BUN 42*  --  39* 37*  CREATININE 1.82*  --  1.75* 1.64*  CALCIUM 10.4*  --  10.2 10.0  MG  --  1.8  --   --    GFR: Estimated Creatinine Clearance: 36.2 mL/min (A) (by C-G formula based on SCr of 1.64 mg/dL (H)). Liver Function Tests:  Recent Labs Lab 01/25/17 1728  AST 21  ALT 16*  ALKPHOS 63  BILITOT 0.7  PROT 6.1*  ALBUMIN 3.5    Recent Labs Lab 01/25/17 1728 01/26/17 1054  LIPASE 55* 52*   No results for input(s): AMMONIA in the last 168 hours. Coagulation Profile:  Recent Labs Lab 01/25/17 2341 01/26/17 0403  INR 1.37 1.41   Cardiac Enzymes:  Recent Labs Lab 01/25/17 2341  CKTOTAL 27*   BNP (last 3 results) No results for input(s): PROBNP in the last 8760 hours. HbA1C: No results for input(s): HGBA1C in the last 72 hours. CBG: No results for input(s): GLUCAP in the last 168 hours. Lipid Profile: No results for input(s): CHOL, HDL, LDLCALC, TRIG, CHOLHDL, LDLDIRECT in the last 72 hours. Thyroid Function Tests:  Recent Labs  01/25/17 2341  TSH 1.390   Anemia Panel: No results for input(s): VITAMINB12, FOLATE, FERRITIN, TIBC,  IRON, RETICCTPCT in the last 72 hours. Sepsis Labs: No results for input(s): PROCALCITON, LATICACIDVEN in the last 168 hours.  No results found for this or any previous visit (from the past 240 hour(s)).   Radiology Studies: Dg Chest 2 View  Result Date: 01/25/2017 CLINICAL DATA:  Fatigue and weakness EXAM: CHEST  2 VIEW COMPARISON:  None. FINDINGS: Post sternotomy changes. Linear atelectasis or scarring at both lung bases. No acute consolidation or effusion. Mild cardiomegaly. Ectatic tortuous aorta with atherosclerosis. No pneumothorax. Right paratracheal opacity. IMPRESSION: 1. Linear scarring or atelectasis at the bases 2. Mild cardiomegaly. Borderline to slightly enlarged mediastinum. In addition there is a vague right paratracheal opacity ; CT suggested for further evaluation. Electronically Signed   By: Donavan Foil M.D.   On: 01/25/2017 20:30   Ct Chest Wo Contrast  Result Date: 01/25/2017 CLINICAL DATA:  Fatigue and weakness for the past 1.5 weeks. Right paratracheal opacity on chest radiographs earlier today. EXAM: CT CHEST WITHOUT CONTRAST TECHNIQUE: Multidetector CT imaging of the chest was performed following the standard protocol without IV contrast. COMPARISON:  Chest radiographs obtained earlier today. FINDINGS: Cardiovascular: Dense atheromatous calcifications, including extensive coronary artery calcifications as well as aortic calcifications. Borderline enlarged heart. Dense mitral valve annulus calcifications. Tortuous aorta and innominate artery, corresponding to the right paratracheal density seen on the chest radiographs earlier today. No mass is demonstrated at that location. Mediastinum/Nodes: No enlarged lymph nodes. Multiple tiny bilateral thyroid gland nodules. The largest is on the left, measuring 4 mm in maximum diameter. Lungs/Pleura: Small right lower lobe calcified granuloma. Minimal diffuse bilateral bullous changes. Bilateral linear atelectasis or scarring. Upper  Abdomen: Dense atheromatous arterial calcifications. Severely atrophied right kidney. The left kidney is not included. Musculoskeletal: Thoracic and lower cervical spine degenerative changes. IMPRESSION: 1. The right paratracheal density seen earlier today is a tortuous innominate artery. 2. Extensive dense calcific coronary artery and aortic atherosclerosis. 3. Minimal changes of COPD with centrilobular emphysema. 4. Sub-centimeter thyroid nodule(s) noted, too small to characterize, but most likely benign in the absence of known clinical risk factors for thyroid carcinoma. 5. Severely atrophied right kidney. The left kidney is not included or absent. Aortic Atherosclerosis (ICD10-I70.0) and Emphysema (ICD10-J43.9). Electronically Signed   By: Percell Locus.D.  On: 01/25/2017 23:07   US Renal  Result Date: 01/26/2017 CLINICAL DATA:  Acute kidney injury EXAM: RENAL / URINARY TRACT ULTRASOUND COMPLETE COMPARISON:  Overlapping portions of CT chest 01/25/2017 FINDINGS: Right Kidney: Length: 6.5 cm. Severe cortical thinning. Echogenic cortex. No obvious mass but the kidney is only indistinctly seen and the cortex seems to have similar echogenicity to the surrounding renal sinus and perirenal adipose tissue. No definite stones. No hydronephrosis. Left Kidney: Length: 6.3 cm. Severely atrophic and echogenic. No obvious stones or mass. Pelvic transplant kidney: Length: 10.4 cm. No hydronephrosis, calculi, or discrete mass. There is a 1.8 by 1.2 by 1.7 cm simple appearing cyst in the transplant left kidney. Bladder: Mild diffuse wall irregularity probably due to nondistention. Prostate gland measured at 4.2 by 4.0 by 4.1 cm (volume = 36 cm^3). IMPRESSION: 1. No hydronephrosis or discrete abnormality of the pelvic transplant kidney aside from a 1.8 cm in long axis simple appearing cyst. 2. The native kidneys are severely atrophic and echogenic. 3. There is slight luminal irregularity in the urinary bladder diffusely,  favoring nondistention over cystitis, correlate with urine analysis. Electronically Signed   By: Van Clines M.D.   On: 01/26/2017 11:48   Scheduled Meds: . aspirin EC  81 mg Oral Daily  . cholecalciferol  1,000 Units Oral Daily  . doxazosin  8 mg Oral BID  . mycophenolate  360 mg Oral BID  . predniSONE  10 mg Oral Q breakfast   Followed by  . [START ON 01/29/2017] predniSONE  5 mg Oral Q breakfast  . vitamin B-6  25 mg Oral Daily  . tacrolimus  2 mg Oral BID  . vitamin B-12  500 mcg Oral Daily  . Warfarin - Pharmacist Dosing Inpatient   Does not apply q1800   Continuous Infusions:   LOS: 0 days   Kerney Elbe, DO [facility] Pager [phone]  If 7PM-7AM, please contact night-coverage [email or web address] Password [facility] 01/26/2017, 9:14 PM

## 2017-01-26 NOTE — Progress Notes (Signed)
New Admission Note:  Arrival Method: Stretcher from ED with RN Mental Orientation: A&Ox4 Telemetry: N/A Assessment: Completed Skin: Assessed with Charito B.,RN, a couple of bruises on R forearm IV: R AC, orders for NS @110  mL/hr placed. RN will implement Pain: 0/10 Tubes: N/A Safety Measures: Safety Fall Prevention Plan discussed with patient. Admission: Completed 2 [place] Orientation: Patient has been orientated to the room, unit and the staff. Family: Girlfriend at bedside.  Orders have been reviewed and implemented. Will continue to monitor the patient. Call light has been placed within reach.  Nena Polio BSN, RN  Phone Number: [phone]

## 2017-01-26 NOTE — Care Management Note (Signed)
Case Management Note  Patient Details  Name: Tien Spooner MRN: [id number] Date of Birth: Sep 29, 1945  Subjective/Objective:     CM following for progression and d/c planning.                Action/Plan: 01/26/2017 Noted CM referral re PCP for this pt. As this pt has [facility] Medicare we are unable to assist with selection of PCP for insured pt. Pt can simply contact his insurance provider for the names of local doctors accepting new patients with this insurance. CM will also provide pt with phone number for [facility] which he must call to received the names and numbers of local MDs accepting new patients.  This number is [phone], will include in d/c instructions.   Expected Discharge Date:                  Expected Discharge Plan:  Home/Self Care  In-House Referral:  NA  Discharge planning Services  CM Consult  Post Acute Care Choice:  NA Choice offered to:  NA  DME Arranged:    DME Agency:     HH Arranged:    HH Agency:     Status of Service:  In process, will continue to follow  If discussed at Long Length of Stay Meetings, dates discussed:    Additional Comments:  Adron Bene, RN 01/26/2017, 10:37 AM

## 2017-01-27 DIAGNOSIS — R531 Weakness: Secondary | ICD-10-CM

## 2017-01-27 DIAGNOSIS — D638 Anemia in other chronic diseases classified elsewhere: Secondary | ICD-10-CM | POA: Diagnosis not present

## 2017-01-27 DIAGNOSIS — E2740 Unspecified adrenocortical insufficiency: Secondary | ICD-10-CM | POA: Diagnosis not present

## 2017-01-27 DIAGNOSIS — Z7901 Long term (current) use of anticoagulants: Secondary | ICD-10-CM | POA: Diagnosis not present

## 2017-01-27 DIAGNOSIS — D696 Thrombocytopenia, unspecified: Secondary | ICD-10-CM | POA: Diagnosis not present

## 2017-01-27 DIAGNOSIS — I480 Paroxysmal atrial fibrillation: Secondary | ICD-10-CM | POA: Diagnosis not present

## 2017-01-27 DIAGNOSIS — Z940 Kidney transplant status: Secondary | ICD-10-CM | POA: Diagnosis not present

## 2017-01-27 DIAGNOSIS — N179 Acute kidney failure, unspecified: Secondary | ICD-10-CM | POA: Diagnosis present

## 2017-01-27 DIAGNOSIS — Z79899 Other long term (current) drug therapy: Secondary | ICD-10-CM | POA: Diagnosis not present

## 2017-01-27 DIAGNOSIS — J432 Centrilobular emphysema: Secondary | ICD-10-CM | POA: Diagnosis not present

## 2017-01-27 DIAGNOSIS — I493 Ventricular premature depolarization: Secondary | ICD-10-CM | POA: Diagnosis not present

## 2017-01-27 DIAGNOSIS — I1311 Hypertensive heart and chronic kidney disease without heart failure, with stage 5 chronic kidney disease, or end stage renal disease: Secondary | ICD-10-CM | POA: Diagnosis not present

## 2017-01-27 DIAGNOSIS — Z7982 Long term (current) use of aspirin: Secondary | ICD-10-CM | POA: Diagnosis not present

## 2017-01-27 DIAGNOSIS — T8619 Other complication of kidney transplant: Secondary | ICD-10-CM | POA: Diagnosis not present

## 2017-01-27 DIAGNOSIS — I10 Essential (primary) hypertension: Secondary | ICD-10-CM | POA: Diagnosis not present

## 2017-01-27 LAB — COMPREHENSIVE METABOLIC PANEL
ALBUMIN: 3.2 g/dL — AB (ref 3.5–5.0)
ALK PHOS: 49 U/L (ref 38–126)
ALT: 15 U/L — AB (ref 17–63)
AST: 18 U/L (ref 15–41)
Anion gap: 9 (ref 5–15)
BILIRUBIN TOTAL: 0.7 mg/dL (ref 0.3–1.2)
BUN: 44 mg/dL — AB (ref 6–20)
CALCIUM: 9.9 mg/dL (ref 8.9–10.3)
CO2: 22 mmol/L (ref 22–32)
CREATININE: 1.96 mg/dL — AB (ref 0.61–1.24)
Chloride: 108 mmol/L (ref 101–111)
GFR calc Af Amer: 37 mL/min — ABNORMAL LOW (ref >60)
GFR calc non Af Amer: 32 mL/min — ABNORMAL LOW (ref >60)
GLUCOSE: 97 mg/dL (ref 65–99)
Potassium: 4.1 mmol/L (ref 3.5–5.1)
Sodium: 139 mmol/L (ref 135–145)
TOTAL PROTEIN: 5.6 g/dL — AB (ref 6.5–8.1)

## 2017-01-27 LAB — PROTIME-INR
INR: 1.5
PROTHROMBIN TIME: 18 s — AB (ref 11.4–15.2)

## 2017-01-27 LAB — PHOSPHORUS: Phosphorus: 4.5 mg/dL (ref 2.5–4.6)

## 2017-01-27 LAB — BASIC METABOLIC PANEL
Anion gap: 10 (ref 5–15)
BUN: 40 mg/dL — AB (ref 6–20)
CALCIUM: 10.1 mg/dL (ref 8.9–10.3)
CO2: 22 mmol/L (ref 22–32)
Chloride: 106 mmol/L (ref 101–111)
Creatinine, Ser: 1.9 mg/dL — ABNORMAL HIGH (ref 0.61–1.24)
GFR calc Af Amer: 39 mL/min — ABNORMAL LOW (ref >60)
GFR, EST NON AFRICAN AMERICAN: 33 mL/min — AB (ref >60)
GLUCOSE: 147 mg/dL — AB (ref 65–99)
POTASSIUM: 3.7 mmol/L (ref 3.5–5.1)
Sodium: 138 mmol/L (ref 135–145)

## 2017-01-27 LAB — CBC WITH DIFFERENTIAL/PLATELET
BASOS PCT: 0 %
Basophils Absolute: 0 10*3/uL (ref 0.0–0.1)
Eosinophils Absolute: 0.1 10*3/uL (ref 0.0–0.7)
Eosinophils Relative: 1 %
HEMATOCRIT: 30.1 % — AB (ref 39.0–52.0)
HEMOGLOBIN: 9.3 g/dL — AB (ref 13.0–17.0)
LYMPHS ABS: 1.3 10*3/uL (ref 0.7–4.0)
Lymphocytes Relative: 23 %
MCH: 27.8 pg (ref 26.0–34.0)
MCHC: 30.9 g/dL (ref 30.0–36.0)
MCV: 89.9 fL (ref 78.0–100.0)
Monocytes Absolute: 0.4 10*3/uL (ref 0.1–1.0)
Monocytes Relative: 7 %
NEUTROS ABS: 3.8 10*3/uL (ref 1.7–7.7)
NEUTROS PCT: 68 %
Platelets: 118 10*3/uL — ABNORMAL LOW (ref 150–400)
RBC: 3.35 MIL/uL — AB (ref 4.22–5.81)
RDW: 14.1 % (ref 11.5–15.5)
WBC: 5.6 10*3/uL (ref 4.0–10.5)

## 2017-01-27 LAB — MAGNESIUM: Magnesium: 1.5 mg/dL — ABNORMAL LOW (ref 1.7–2.4)

## 2017-01-27 MED ORDER — WARFARIN SODIUM 7.5 MG PO TABS
7.5000 mg | ORAL_TABLET | Freq: Once | ORAL | Status: AC
  Administered 2017-01-27: 7.5 mg via ORAL
  Filled 2017-01-27: qty 1

## 2017-01-27 MED ORDER — SODIUM CHLORIDE 0.9 % IV SOLN
INTRAVENOUS | Status: DC
  Administered 2017-01-27 – 2017-01-28 (×3): via INTRAVENOUS

## 2017-01-27 MED ORDER — MAGNESIUM SULFATE IN D5W 1-5 GM/100ML-% IV SOLN
1.0000 g | Freq: Once | INTRAVENOUS | Status: AC
  Administered 2017-01-27: 1 g via INTRAVENOUS
  Filled 2017-01-27: qty 100

## 2017-01-27 NOTE — Discharge Instructions (Signed)
For the names and numbers of local primary care physicians accepting patients, please call [facility] at [phone].   Information on my medicine - Coumadin   (Warfarin)  Why was Coumadin prescribed for you? Coumadin was prescribed for you because you have a blood clot or a medical condition that can cause an increased risk of forming blood clots. Blood clots can cause serious health problems by blocking the flow of blood to the heart, lung, or brain. Coumadin can prevent harmful blood clots from forming. As a reminder your indication for Coumadin is:   Stroke Prevention Because Of Atrial Fibrillation  What test will check on my response to Coumadin? While on Coumadin (warfarin) you will need to have an INR test regularly to ensure that your dose is keeping you in the desired range. The INR (international normalized ratio) number is calculated from the result of the laboratory test called prothrombin time (PT).  If an INR APPOINTMENT HAS NOT ALREADY BEEN MADE FOR YOU please schedule an appointment to have this lab work done by your health care provider within 7 days. Your INR goal is usually a number between:  2 to 3 or your provider may give you a more narrow range like 2-2.5.  Ask your health care provider during an office visit what your goal INR is.  What  do you need to  know  About  COUMADIN? Take Coumadin (warfarin) exactly as prescribed by your healthcare provider about the same time each day.  DO NOT stop taking without talking to the doctor who prescribed the medication.  Stopping without other blood clot prevention medication to take the place of Coumadin may increase your risk of developing a new clot or stroke.  Get refills before you run out.  What do you do if you miss a dose? If you miss a dose, take it as soon as you remember on the same day then continue your regularly scheduled regimen the next day.  Do not take two doses of Coumadin at the same time.  Important Safety  Information A possible side effect of Coumadin (Warfarin) is an increased risk of bleeding. You should call your healthcare provider right away if you experience any of the following: ? Bleeding from an injury or your nose that does not stop. ? Unusual colored urine (red or dark brown) or unusual colored stools (red or black). ? Unusual bruising for unknown reasons. ? A serious fall or if you hit your head (even if there is no bleeding).  Some foods or medicines interact with Coumadin (warfarin) and might alter your response to warfarin. To help avoid this: ? Eat a balanced diet, maintaining a consistent amount of Vitamin K. ? Notify your provider about major diet changes you plan to make. ? Avoid alcohol or limit your intake to 1 drink for women and 2 drinks for men per day. (1 drink is 5 oz. wine, 12 oz. beer, or 1.5 oz. liquor.)  Make sure that ANY health care provider who prescribes medication for you knows that you are taking Coumadin (warfarin).  Also make sure the healthcare provider who is monitoring your Coumadin knows when you have started a new medication including herbals and non-prescription products.  Coumadin (Warfarin)  Major Drug Interactions  Increased Warfarin Effect Decreased Warfarin Effect  Alcohol (large quantities) Antibiotics (esp. Septra/Bactrim, Flagyl, Cipro) Amiodarone (Cordarone) Aspirin (ASA) Cimetidine (Tagamet) Megestrol (Megace) NSAIDs (ibuprofen, naproxen, etc.) Piroxicam (Feldene) Propafenone (Rythmol SR) Propranolol (Inderal) Isoniazid (INH) Posaconazole (Noxafil) Barbiturates (Phenobarbital) Carbamazepine (  Tegretol) °Chlordiazepoxide (Librium) °Cholestyramine (Questran) °Griseofulvin °Oral Contraceptives °Rifampin °Sucralfate (Carafate) °Vitamin K  ° °Coumadin® (Warfarin) Major Herbal Interactions  °Increased Warfarin Effect Decreased Warfarin Effect  °Garlic °Ginseng °Ginkgo biloba Coenzyme Q10 °Green tea °St. John’s wort   ° °Coumadin® (Warfarin)  FOOD Interactions  °Eat a consistent number of servings per week of foods HIGH in Vitamin K °(1 serving = ½ cup)  °Collards (cooked, or boiled & drained) °Kale (cooked, or boiled & drained) °Mustard greens (cooked, or boiled & drained) °Parsley *serving size only = ¼ cup °Spinach (cooked, or boiled & drained) °Swiss chard (cooked, or boiled & drained) °Turnip greens (cooked, or boiled & drained)  °Eat a consistent number of servings per week of foods MEDIUM-HIGH in Vitamin K °(1 serving = 1 cup)  °Asparagus (cooked, or boiled & drained) °Broccoli (cooked, boiled & drained, or raw & chopped) °Brussel sprouts (cooked, or boiled & drained) *serving size only = ½ cup °Lettuce, raw (green leaf, endive, romaine) °Spinach, raw °Turnip greens, raw & chopped  ° °These websites have more information on Coumadin (warfarin):  www.coumadin.com; °www.ahrq.gov/consumer/coumadin.htm; ° ° ° °

## 2017-01-27 NOTE — Progress Notes (Signed)
ANTICOAGULATION CONSULT NOTE - Follow-Up  Pharmacy Consult for Warfarin Indication: atrial fibrillation  No Known Allergies  Patient Measurements: Height: 5\' 6"  (167.6 cm) Weight: 146 lb 3.2 oz (66.3 kg) IBW/kg (Calculated) : 63.8  Vital Signs: Temp: 98.3 F (36.8 C) (10/26 0900) Temp Source: Oral (10/26 0900) BP: 123/60 (10/26 0900) Pulse Rate: 63 (10/26 0900)  Labs:  Recent Labs  01/25/17 1728 01/25/17 2341 01/26/17 0403 01/26/17 1054 01/27/17 0221  HGB 10.3*  --  9.6*  --  9.3*  HCT 33.1*  --  30.1*  --  30.1*  PLT 125*  --  117*  --  118*  LABPROT  --  16.8* 17.1*  --  18.0*  INR  --  1.37 1.41  --  1.50  CREATININE 1.82*  --  1.75* 1.64* 1.96*  CKTOTAL  --  27*  --   --   --     Estimated Creatinine Clearance: 30.3 mL/min (A) (by C-G formula based on SCr of 1.96 mg/dL (H)).  Assessment: 57 YOM who presented on 10/24 with generalized weakness. The patient was on warfarin PTA for hx Afib - admit INR 1.37 on PTA dose of 6 mg/day. Pharmacy consulted to resume dosing this admission.   INR 1.41 > 1.5 over last 24 hours. CBC stable with no reported bleeding.   Goal of Therapy:  INR 2-3   Plan:  1. Warfarin 7.5 mg x 1 dose at 1800 today 2. Will continue to monitor for any signs/symptoms of bleeding and will follow up with PT/INR in the a.m.   Thank you for allowing pharmacy to be a part of this patient's care.  Vincenza Hews, PharmD, BCPS Clinical phone for 01/27/2017 from 7a-3:30p: [phone] If after 3:30p, please call main pharmacy at: [phone] 01/27/2017 9:45 AM    01/27/2017, 9:46 AM

## 2017-01-27 NOTE — Progress Notes (Signed)
PROGRESS NOTE    Jeffrey Perez  IRC:[id number] DOB: 1946/03/14 DOA: 01/25/2017 PCP: Patient, No Pcp Per  Brief Narrative:  Jeffrey Perez is a 71 y.o. male with medical history significant for end-stage renal disease status post renal transplant approximately 25 years ago, hypertension, chronic anemia, and paroxysmal atrial fibrillation on warfarin, presenting to the emergency department for evaluation of generalized weakness.  Patient is here visiting from [place] state and reports that he has been generally weak for the past 2 weeks.  He denies any focal numbness or weakness, denies headache, denies change in vision or hearing, and denies loss of coordination.  Patient reports a mild decrease in his appetite, but has continued to eat as usual with the urging of his wife.  He denies vomiting or diarrhea.  No abdominal pain, dysuria, or flank pain.  No significant dyspnea or cough.  Denies fevers or chills.  He went to a walk-in clinic yesterday for evaluation of these complaints, basic blood work was obtained, and he was called back today with instructions to proceed to the emergency department for evaluation of an abnormal lab. The specific lab of concern was not specified.  Upon arrival to the ED, patient was found to be afebrile, saturating well on room air, and with vitals otherwise stable.  Chemistry panel reveals a BUN of 42 and creatinine of 1.82, up from 1.46 in 2015 (the most recent available).  He remained hemodynamically stable and in no respiratory distress.  He was placed  on the medical-surgical unit for ongoing evaluation and management of generalized weakness in a patient with renal transplant and immunosuppression. Patient still continues to be weak and Cr increased.   Assessment & Plan:   Principal Problem:   General weakness Active Problems:   Paroxysmal atrial fibrillation (HCC)   Hypertension   History of renal transplant   Normocytic anemia   Thrombocytopenia (HCC)  Renal insufficiency   Generalized weakness  1. Generalized weakness possibly 2/2 to Adrenal Insuffiencey  - Pt presented with 2 wks of progressive generalized weakness and lethargy  - No focal deficit, and no s/s of infectious process; Afebrile and no WBC - Labs appear fairly stable from our most recent available priors from 2015  - Pt appears fatigued, but not acutely ill ; ?Adrenal insuffiencey - Checked TSH 1.390, serum CK 27 , ESR 26, CRP 1.7,  - PT recommending Home Health - ?Adrenal Insufficiency so will double Prednisone Dose to 10 mg po daily for 3 days and then go back to 5 mg po Daily   2. ESRD s/p Renal transplant, renal insufficiency  - Pt presented with generalized weakness, noted to have SCr of 1.82, up from prior value of 1.46 in 2015  - Baseline GFR not clear, but there is no hypertension or proteinuria to suggest rejection  - He was treated with 500 cc NS in ED and will be continued on IVF hydration yesterday but stopped. - BUN/Cr went from 37/1.64 -> 44/1.96 so IVF rehydration was restarted and repeat BUN/Cr was 40/1.90 - Renal U/S showed No hydronephrosis or discrete abnormality of the pelvic transplant kidney aside from a 1.8 cm in long axis simple appearing Cyst. The native kidneys are severely atrophic and echogenic There is slight luminal irregularity in the urinary bladder diffusely, favoring nondistention over cystitis, correlate with urine analysis. - Urinalysis Clear - C/w Transplant Meds; Were wrong on Admission but now corrected and patient takes Mycophenalate 360 mg po BID, Tacrolimus 2 mg po BID, and  Prednisone 5 mg po Daily  - If continuing to [facility] will ask for Renal Consultation   3. Anemia, Thrombocytopenia - Hgb is 10.3 and platelets 125,000 on admission and now 9.3 and 118,000 respectively  - No bleeding evident, likely secondary to chronic disease, continue to supplement iron and B-vitamins    4. Paroxysmal atrial fibrillation  - In a sinus  rhythm on admission  - CHADS-VASc is at least 2 (age, HTN)  - Check INR and continue warfarin with Pharmacy to dose as INR was subtherapeutic still at 1.50 - Pharmacy giving 1.5 mg - Patient no longer taking Amiodarone  5. Right Paratracheal Opacity -Seen on CXR -Obtained CT Chest w/o Contrast and Right paratracheal Density was a tortuous innonomate artery   DVT prophylaxis: Anticoagulated with Coumadin  Code Status: FULL CODE  Family Communication: No family present at bedside Disposition Plan: [facility] with [facility] when medically stable   Consultants:   Discussed with Nephrology Dr. Jimmy Footman informally   Procedures: None   Antimicrobials: Anti-infectives    None     Subjective: Seen and examined this AM and was still feeling the same as he was yesterday. No CP or SOB.   Objective: Vitals:   01/26/17 2144 01/27/17 0545 01/27/17 0900 01/27/17 1555  BP: (!) 145/50 (!) 160/40 123/60 127/67  Pulse: 74 70 63 72  Resp: _0 Temp: 97.7 F (36.5 C) 98.2 F (36.8 C) 98.3 F (36.8 C) 97.9 F (36.6 C)  TempSrc: Oral Oral Oral Oral  SpO2: 98% 99% 96% 98%  Weight: 66.3 kg (146 lb 3.2 oz)     Height:        Intake/Output Summary (Last 24 hours) at 01/27/17 1934 Last data filed at 01/27/17 1700  Gross per 24 hour  Intake              880 ml  Output             1975 ml  Net            -1095 ml   Filed Weights   01/25/17 1715 01/26/17 2144  Weight: 67.6 kg (149 lb) 66.3 kg (146 lb 3.2 oz)   Examination: Physical Exam:  Constitutional: WN/WD, NAD appears calm and comfortable.  Eyes: Lids and conjunctivae appear normal; Sclerae anicteric ENMT: External Ears and Nose appear normal. MMM Neck: Supple with no JVD Respiratory: CTAB; No wheezing/rales/rhonchi. Patient was not tachypenic or using any accessory muscles to breathe Cardiovascular: RRR; No m/r/g. No extremity edema Abdomen: Soft, NT, ND. Bowel sounds present GU: Deferred Musculoskeletal: No clubbing  or cyanosis; Has gout changes in elbows and hands Skin: Warm and dry. No rashes or lesions on a limited skin evaluation Neurologic: CN 2-12 grossly intact. No appreciable focal deficits Psychiatric: Normal mood and affect. Intact judgement and insight  Data Reviewed: I have personally reviewed following labs and imaging studies  CBC:  Recent Labs Lab 01/25/17 1728 01/26/17 0403 01/27/17 0221  WBC 6.0 5.5 5.6  NEUTROABS  --   --  3.8  HGB 10.3* 9.6* 9.3*  HCT 33.1* 30.1* 30.1*  MCV 89.7 89.6 89.9  PLT 125* 117* 802*   Basic Metabolic Panel:  Recent Labs Lab 01/25/17 1728 01/25/17 2046 01/26/17 0403 01/26/17 1054 01/27/17 0221 01/27/17 1455  NA 135  --  141 141 139 138  K 5.0  --  4.1 3.8 4.1 3.7  CL 102  --  107 111 108 106  CO2 23  --  _0 GLUCOSE 143*  --  102* 91 97 147*  BUN 42*  --  39* 37* 44* 40*  CREATININE 1.82*  --  1.75* 1.64* 1.96* 1.90*  CALCIUM 10.4*  --  10.2 10.0 9.9 10.1  MG  --  1.8  --   --  1.5*  --   PHOS  --   --   --   --  4.5  --    GFR: Estimated Creatinine Clearance: 31.2 mL/min (A) (by C-G formula based on SCr of 1.9 mg/dL (H)). Liver Function Tests:  Recent Labs Lab 01/25/17 1728 01/27/17 0221  AST 21 18  ALT 16* 15*  ALKPHOS 63 49  BILITOT 0.7 0.7  PROT 6.1* 5.6*  ALBUMIN 3.5 3.2*    Recent Labs Lab 01/25/17 1728 01/26/17 1054  LIPASE 55* 52*   No results for input(s): AMMONIA in the last 168 hours. Coagulation Profile:  Recent Labs Lab 01/25/17 2341 01/26/17 0403 01/27/17 0221  INR 1.37 1.41 1.50   Cardiac Enzymes:  Recent Labs Lab 01/25/17 2341  CKTOTAL 27*   BNP (last 3 results) No results for input(s): PROBNP in the last 8760 hours. HbA1C: No results for input(s): HGBA1C in the last 72 hours. CBG: No results for input(s): GLUCAP in the last 168 hours. Lipid Profile: No results for input(s): CHOL, HDL, LDLCALC, TRIG, CHOLHDL, LDLDIRECT in the last 72 hours. Thyroid Function  Tests:  Recent Labs  01/25/17 2341  TSH 1.390   Anemia Panel: No results for input(s): VITAMINB12, FOLATE, FERRITIN, TIBC, IRON, RETICCTPCT in the last 72 hours. Sepsis Labs: No results for input(s): PROCALCITON, LATICACIDVEN in the last 168 hours.  No results found for this or any previous visit (from the past 240 hour(s)).   Radiology Studies: Dg Chest 2 View  Result Date: 01/25/2017 CLINICAL DATA:  Fatigue and weakness EXAM: CHEST  2 VIEW COMPARISON:  None. FINDINGS: Post sternotomy changes. Linear atelectasis or scarring at both lung bases. No acute consolidation or effusion. Mild cardiomegaly. Ectatic tortuous aorta with atherosclerosis. No pneumothorax. Right paratracheal opacity. IMPRESSION: 1. Linear scarring or atelectasis at the bases 2. Mild cardiomegaly. Borderline to slightly enlarged mediastinum. In addition there is a vague right paratracheal opacity ; CT suggested for further evaluation. Electronically Signed   By: Donavan Foil M.D.   On: 01/25/2017 20:30   Ct Chest Wo Contrast  Result Date: 01/25/2017 CLINICAL DATA:  Fatigue and weakness for the past 1.5 weeks. Right paratracheal opacity on chest radiographs earlier today. EXAM: CT CHEST WITHOUT CONTRAST TECHNIQUE: Multidetector CT imaging of the chest was performed following the standard protocol without IV contrast. COMPARISON:  Chest radiographs obtained earlier today. FINDINGS: Cardiovascular: Dense atheromatous calcifications, including extensive coronary artery calcifications as well as aortic calcifications. Borderline enlarged heart. Dense mitral valve annulus calcifications. Tortuous aorta and innominate artery, corresponding to the right paratracheal density seen on the chest radiographs earlier today. No mass is demonstrated at that location. Mediastinum/Nodes: No enlarged lymph nodes. Multiple tiny bilateral thyroid gland nodules. The largest is on the left, measuring 4 mm in maximum diameter. Lungs/Pleura: Small  right lower lobe calcified granuloma. Minimal diffuse bilateral bullous changes. Bilateral linear atelectasis or scarring. Upper Abdomen: Dense atheromatous arterial calcifications. Severely atrophied right kidney. The left kidney is not included. Musculoskeletal: Thoracic and lower cervical spine degenerative changes. IMPRESSION: 1. The right paratracheal density seen earlier today is a tortuous innominate artery. 2. Extensive dense calcific coronary artery and aortic atherosclerosis. 3. Minimal changes  of COPD with centrilobular emphysema. 4. Sub-centimeter thyroid nodule(s) noted, too small to characterize, but most likely benign in the absence of known clinical risk factors for thyroid carcinoma. 5. Severely atrophied right kidney. The left kidney is not included or absent. Aortic Atherosclerosis (ICD10-I70.0) and Emphysema (ICD10-J43.9). Electronically Signed   By: Claudie Revering M.D.   On: 01/25/2017 23:07   US Renal  Result Date: 01/26/2017 CLINICAL DATA:  Acute kidney injury EXAM: RENAL / URINARY TRACT ULTRASOUND COMPLETE COMPARISON:  Overlapping portions of CT chest 01/25/2017 FINDINGS: Right Kidney: Length: 6.5 cm. Severe cortical thinning. Echogenic cortex. No obvious mass but the kidney is only indistinctly seen and the cortex seems to have similar echogenicity to the surrounding renal sinus and perirenal adipose tissue. No definite stones. No hydronephrosis. Left Kidney: Length: 6.3 cm. Severely atrophic and echogenic. No obvious stones or mass. Pelvic transplant kidney: Length: 10.4 cm. No hydronephrosis, calculi, or discrete mass. There is a 1.8 by 1.2 by 1.7 cm simple appearing cyst in the transplant left kidney. Bladder: Mild diffuse wall irregularity probably due to nondistention. Prostate gland measured at 4.2 by 4.0 by 4.1 cm (volume = 36 cm^3). IMPRESSION: 1. No hydronephrosis or discrete abnormality of the pelvic transplant kidney aside from a 1.8 cm in long axis simple appearing cyst. 2.  The native kidneys are severely atrophic and echogenic. 3. There is slight luminal irregularity in the urinary bladder diffusely, favoring nondistention over cystitis, correlate with urine analysis. Electronically Signed   By: Van Clines M.D.   On: 01/26/2017 11:48   Scheduled Meds: . aspirin EC  81 mg Oral Daily  . cholecalciferol  1,000 Units Oral Daily  . doxazosin  8 mg Oral BID  . mycophenolate  360 mg Oral BID  . predniSONE  10 mg Oral Q breakfast   Followed by  . [START ON 01/29/2017] predniSONE  5 mg Oral Q breakfast  . vitamin B-6  25 mg Oral Daily  . tacrolimus  2 mg Oral BID  . vitamin B-12  500 mcg Oral Daily  . Warfarin - Pharmacist Dosing Inpatient   Does not apply q1800   Continuous Infusions: . sodium chloride 75 mL/hr at 01/27/17 0842     LOS: 0 days   Kerney Elbe, DO [facility] Pager [phone]  If 7PM-7AM, please contact night-coverage [email or web address] Password [facility] 01/27/2017, 7:34 PM

## 2017-01-28 DIAGNOSIS — Z940 Kidney transplant status: Secondary | ICD-10-CM | POA: Diagnosis not present

## 2017-01-28 DIAGNOSIS — R531 Weakness: Secondary | ICD-10-CM | POA: Diagnosis not present

## 2017-01-28 DIAGNOSIS — N179 Acute kidney failure, unspecified: Secondary | ICD-10-CM | POA: Diagnosis not present

## 2017-01-28 DIAGNOSIS — I10 Essential (primary) hypertension: Secondary | ICD-10-CM | POA: Diagnosis not present

## 2017-01-28 LAB — CBC WITH DIFFERENTIAL/PLATELET
Basophils Absolute: 0 10*3/uL (ref 0.0–0.1)
Basophils Relative: 0 %
EOS PCT: 3 %
Eosinophils Absolute: 0.1 10*3/uL (ref 0.0–0.7)
HEMATOCRIT: 29 % — AB (ref 39.0–52.0)
Hemoglobin: 9 g/dL — ABNORMAL LOW (ref 13.0–17.0)
LYMPHS ABS: 1.4 10*3/uL (ref 0.7–4.0)
LYMPHS PCT: 26 %
MCH: 27.7 pg (ref 26.0–34.0)
MCHC: 31 g/dL (ref 30.0–36.0)
MCV: 89.2 fL (ref 78.0–100.0)
MONO ABS: 0.5 10*3/uL (ref 0.1–1.0)
Monocytes Relative: 9 %
Neutro Abs: 3.5 10*3/uL (ref 1.7–7.7)
Neutrophils Relative %: 63 %
PLATELETS: 107 10*3/uL — AB (ref 150–400)
RBC: 3.25 MIL/uL — AB (ref 4.22–5.81)
RDW: 14 % (ref 11.5–15.5)
WBC: 5.5 10*3/uL (ref 4.0–10.5)

## 2017-01-28 LAB — COMPREHENSIVE METABOLIC PANEL
ALT: 15 U/L — ABNORMAL LOW (ref 17–63)
AST: 18 U/L (ref 15–41)
Albumin: 3 g/dL — ABNORMAL LOW (ref 3.5–5.0)
Alkaline Phosphatase: 45 U/L (ref 38–126)
Anion gap: 10 (ref 5–15)
BILIRUBIN TOTAL: 0.5 mg/dL (ref 0.3–1.2)
BUN: 42 mg/dL — ABNORMAL HIGH (ref 6–20)
CO2: 21 mmol/L — ABNORMAL LOW (ref 22–32)
Calcium: 9.4 mg/dL (ref 8.9–10.3)
Chloride: 108 mmol/L (ref 101–111)
Creatinine, Ser: 1.75 mg/dL — ABNORMAL HIGH (ref 0.61–1.24)
GFR calc Af Amer: 43 mL/min — ABNORMAL LOW (ref >60)
GFR, EST NON AFRICAN AMERICAN: 37 mL/min — AB (ref >60)
Glucose, Bld: 85 mg/dL (ref 65–99)
POTASSIUM: 3.8 mmol/L (ref 3.5–5.1)
Sodium: 139 mmol/L (ref 135–145)
TOTAL PROTEIN: 5.3 g/dL — AB (ref 6.5–8.1)

## 2017-01-28 LAB — MAGNESIUM: Magnesium: 1.5 mg/dL — ABNORMAL LOW (ref 1.7–2.4)

## 2017-01-28 LAB — PROTIME-INR
INR: 1.99
Prothrombin Time: 22.4 seconds — ABNORMAL HIGH (ref 11.4–15.2)

## 2017-01-28 LAB — GLUCOSE, CAPILLARY: GLUCOSE-CAPILLARY: 103 mg/dL — AB (ref 65–99)

## 2017-01-28 LAB — PHOSPHORUS: PHOSPHORUS: 4.3 mg/dL (ref 2.5–4.6)

## 2017-01-28 MED ORDER — PREDNISONE 5 MG PO TABS: ORAL_TABLET | ORAL | 0 refills | Status: DC

## 2017-01-28 MED ORDER — WARFARIN SODIUM 6 MG PO TABS
6.0000 mg | ORAL_TABLET | Freq: Once | ORAL | Status: AC
  Administered 2017-01-28: 6 mg via ORAL
  Filled 2017-01-28: qty 1

## 2017-01-28 MED ORDER — WARFARIN SODIUM 1 MG PO TABS: 1.0000 mg | ORAL_TABLET | Freq: Every day | ORAL | 11 refills | Status: AC

## 2017-01-28 MED ORDER — MAGNESIUM SULFATE 2 GM/50ML IV SOLN
2.0000 g | Freq: Once | INTRAVENOUS | Status: AC
  Administered 2017-01-28: 2 g via INTRAVENOUS
  Filled 2017-01-28: qty 50

## 2017-01-28 MED ORDER — ASPIRIN 81 MG PO TBEC: 81.0000 mg | DELAYED_RELEASE_TABLET | Freq: Every day | ORAL | 0 refills | Status: DC

## 2017-01-28 NOTE — Discharge Summary (Signed)
Physician Discharge Summary  Jeffrey Perez QQV:[id number] DOB: 11-11-1945 DOA: 01/25/2017  PCP: Patient, No Pcp Per  Admit date: 01/25/2017 Discharge date: 01/28/2017  Admitted From: Home Disposition: Home with [facility] and [place]  Recommendations for Outpatient Follow-up:  1. Follow up with PCP in 1-2 weeks 2. Follow up with [facility] on Monday For INR Check 3. Repeat PT-INR within Next 3 days; C/w 7 mg of Coumdain Daily (6 mg tab + 1 mg tab) 4. Follow up with Nephrology within 1 week 5. Please obtain CMP/CBC, Mag, Phos in one week 6. Please follow up on the following pending results:  Home Health: Yes Equipment/Devices: Conservation officer, nature   Discharge Condition: Stable CODE STATUS: FULL CODE Diet recommendation: Heart Healthy Renal Diet  Brief/Interim Summary: Jeffrey Perez a 71 y.o.malewith medical history significant for end-stage renal disease status post renal transplant approximately 25 years ago, hypertension, chronic anemia, and paroxysmal atrial fibrillation on warfarin, presenting to the emergency department for evaluation of generalized weakness. Patient is here visiting from [place] state and reports that he has been generally weak for the past 2 weeks. He denies any focal numbness or weakness, denies headache, denies change in vision or hearing, and denies loss of coordination. Patient reported a mild decrease in his appetite, but has continued to eat as usual with the urging of his girlfriend. He denies vomiting or diarrhea. No abdominal pain, dysuria, or flank pain. No significant dyspnea or cough. Denies fevers or chills. He went to a walk-in clinic yesterday for evaluation of these complaints, basic blood work was obtained, and he was called back today with instructions to proceed to the emergency department for evaluation of an abnormal lab. The specific lab of concern was not specified.   Upon arrival to the ED, patient was found to be afebrile,  saturating well on room air, and with vitals otherwise stable. Chemistry panel reveals a BUN of 42 and creatinine of 1.82, up from 1.46 in 2015 (the most recent available). He remained hemodynamically stable and in no respiratory distress. He was placed  on the medical-surgical unit for ongoing evaluation and management of generalized weakness in a patient with renal transplant and immunosuppression. Patient continued to remain weak but felt a little better after Prednisone was doubled. PT Evaluated and recommended Home Health PT with [facility]. Cr was elevated so he was hydrated with Improvement. Patient's INR was also low so Coumadin was adjusted and will be discharged on 7 mg po Daily and will need INR Check on Monday. Patient deemed medically stable to D/C Home with Home Health. Patient states he will be going back to [place] within a few weeks and follow up with his Nephrologist then.   Discharge Diagnoses:  Principal Problem:   General weakness Active Problems:   Paroxysmal atrial fibrillation (HCC)   Hypertension   History of renal transplant   Normocytic anemia   Thrombocytopenia (HCC)   Renal insufficiency   Generalized weakness  1. Generalized weakness possibly 2/2 to Adrenal Insuffiencey  - Pt presented with 2 wks of progressive generalized weakness and lethargy  - No focal deficit, and no s/s of infectious process; Afebrile and no WBC - Labs appear fairly stable from our most recent available priors from 2015  - Pt appears fatigued, but not acutely ill ; ?Adrenal insuffiencey - Checked TSH 1.390, serum CK 27 , ESR 26, CRP 1.7,  - PT recommending Home Health - ?Adrenal Insufficiency so will double Prednisone Dose to 10 mg po  daily for 5 days total (3 more to go) and then go back to 5 mg po Daily  - Follow up with PCP and Nephrology   2. ESRD s/p Renal transplant, renal insufficiency  - Pt presented with generalized weakness, noted to have SCr of 1.82, up from prior value of  1.46 in 2015  - Baseline GFR not clear, but there is no hypertension or proteinuria to suggest rejection  - He was treated with 500 cc NS in ED and will be continued on IVF hydration yesterday but stopped. - BUN/Cr went from 37/1.64 -> 44/1.96 so IVF rehydration was restarted and repeat BUN/Cr was 42/1.75 - Renal U/S showed No hydronephrosis or discrete abnormality of the pelvic transplant kidney aside from a 1.8 cm in long axis simple appearing Cyst. The native kidneys are severely atrophic and echogenic There is slight luminal irregularity in the urinary bladder diffusely, favoring nondistention over cystitis, correlate with urine analysis. - Urinalysis Clear - C/w Transplant Meds; Were wrong on Admission but now corrected and patient takes Mycophenalate 360 mg po BID, Tacrolimus 2 mg po BID, and Prednisone 5 mg po Daily  - Follow up with Renal as an outpatient   3. Anemia, Thrombocytopenia - Hgb is 10.3 and platelets 125,000 on admission and now 9.0 and 107,000 respectively and likley dilutional drop - No bleeding evident, likely secondary to chronic disease, continue to supplement iron and B-vitamins  - Follow up with PCP as an outpatient   4. Paroxysmal atrial fibrillation  - In a sinus rhythm on admission  - CHADS-VASc is at least 2 (age, HTN)  - Checked INR and continue warfarin with Pharmacy to dose as INR was subtherapeutic still at 1.99 - Recommending Taking 7 mg of Coumadin and having INR Repeated on Monday 01/30/17 - Patient no longer taking Amiodarone  5. Right Paratracheal Opacity -Seen on CXR -Obtained CT Chest w/o Contrast and Right paratracheal Density was a tortuous innonomate artery   6. Hypomagnesemia -Mag Level was 1.5 -Replete with IV Mag Sulfate 2 grams -Continue to Monitor and Repeat Mag as an outpatient   Discharge Instructions  Discharge Instructions    Call MD for:  difficulty breathing, headache or visual disturbances    Complete by:  As directed     Call MD for:  extreme fatigue    Complete by:  As directed    Call MD for:  hives    Complete by:  As directed    Call MD for:  persistant dizziness or light-headedness    Complete by:  As directed    Call MD for:  persistant nausea and vomiting    Complete by:  As directed    Call MD for:  redness, tenderness, or signs of infection (pain, swelling, redness, odor or green/yellow discharge around incision site)    Complete by:  As directed    Call MD for:  severe uncontrolled pain    Complete by:  As directed    Call MD for:  temperature >100.4    Complete by:  As directed    Diet - low sodium heart healthy    Complete by:  As directed    Discharge instructions    Complete by:  As directed    Follow up with and find a PCP within 1 week and have Coumadin Check at the [facility] on Monday to measure INR. Take 7 mg of Coumadin total (6 mg + 1 mg Tablets) until INR Check. If symptoms change or worsen  please return to the ED for evaluation.   Increase activity slowly    Complete by:  As directed      Allergies as of 01/28/2017   No Known Allergies     Medication List    STOP taking these medications   aspirin 81 MG tablet Replaced by:  aspirin 81 MG EC tablet     TAKE these medications   aspirin 81 MG EC tablet Take 1 tablet (81 mg total) by mouth daily. Replaces:  aspirin 81 MG tablet   cholecalciferol 1000 units tablet Commonly known as:  VITAMIN D Take 1,000 Units by mouth daily.   COLCHICINE PO Take 0.6 mg by mouth daily as needed (gout).   doxazosin 8 MG tablet Commonly known as:  CARDURA Take 8 mg by mouth 2 (two) times daily.   MYFORTIC PO Take 360 mg by mouth 2 (two) times daily.   predniSONE 5 MG tablet Commonly known as:  DELTASONE Take 10 mg (2 tablets) po Daily x 3 more days and then go back to 5 mg po Daily What changed:  how much to take  how to take this  when to take this  additional instructions   tacrolimus 1 MG capsule Commonly known as:   PROGRAF Take 2 mg by mouth 2 (two) times daily.   VITAMIN B-12 PO Take by mouth daily.   VITAMIN B-6 PO Take by mouth daily.   warfarin 1 MG tablet Commonly known as:  COUMADIN Take 1 tablet (1 mg total) by mouth daily. What changed:  You were already taking a medication with the same name, and this prescription was added. Make sure you understand how and when to take each.   warfarin 6 MG tablet Commonly known as:  COUMADIN Take 6 mg by mouth daily. What changed:  Another medication with the same name was added. Make sure you understand how and when to take each.            Durable Medical Equipment        Start     Ordered   01/28/17 1449  For home use only DME Walker rolling  [facility])  Once    Question:  Patient needs a walker to treat with the following condition  Answer:  Generalized weakness   01/28/17 1453     Follow-up Information    [place]. Call.   Why:  at 9:00 am Monday to schedule an INR check for your coumadin.  Contact information: [address] [place] [address] [phone]         No Known Allergies  Consultations:  Discussed Case briefly with Dr. Jimmy Footman informally  Procedures/Studies: Dg Chest 2 View  Result Date: 01/25/2017 CLINICAL DATA:  Fatigue and weakness EXAM: CHEST  2 VIEW COMPARISON:  None. FINDINGS: Post sternotomy changes. Linear atelectasis or scarring at both lung bases. No acute consolidation or effusion. Mild cardiomegaly. Ectatic tortuous aorta with atherosclerosis. No pneumothorax. Right paratracheal opacity. IMPRESSION: 1. Linear scarring or atelectasis at the bases 2. Mild cardiomegaly. Borderline to slightly enlarged mediastinum. In addition there is a vague right paratracheal opacity ; CT suggested for further evaluation. Electronically Signed   By: Donavan Foil M.D.   On: 01/25/2017 20:30   Ct Chest Wo Contrast  Result Date: 01/25/2017 CLINICAL DATA:   Fatigue and weakness for the past 1.5 weeks. Right paratracheal opacity on chest radiographs earlier today. EXAM: CT CHEST WITHOUT CONTRAST TECHNIQUE: Multidetector CT imaging  of the chest was performed following the standard protocol without IV contrast. COMPARISON:  Chest radiographs obtained earlier today. FINDINGS: Cardiovascular: Dense atheromatous calcifications, including extensive coronary artery calcifications as well as aortic calcifications. Borderline enlarged heart. Dense mitral valve annulus calcifications. Tortuous aorta and innominate artery, corresponding to the right paratracheal density seen on the chest radiographs earlier today. No mass is demonstrated at that location. Mediastinum/Nodes: No enlarged lymph nodes. Multiple tiny bilateral thyroid gland nodules. The largest is on the left, measuring 4 mm in maximum diameter. Lungs/Pleura: Small right lower lobe calcified granuloma. Minimal diffuse bilateral bullous changes. Bilateral linear atelectasis or scarring. Upper Abdomen: Dense atheromatous arterial calcifications. Severely atrophied right kidney. The left kidney is not included. Musculoskeletal: Thoracic and lower cervical spine degenerative changes. IMPRESSION: 1. The right paratracheal density seen earlier today is a tortuous innominate artery. 2. Extensive dense calcific coronary artery and aortic atherosclerosis. 3. Minimal changes of COPD with centrilobular emphysema. 4. Sub-centimeter thyroid nodule(s) noted, too small to characterize, but most likely benign in the absence of known clinical risk factors for thyroid carcinoma. 5. Severely atrophied right kidney. The left kidney is not included or absent. Aortic Atherosclerosis (ICD10-I70.0) and Emphysema (ICD10-J43.9). Electronically Signed   By: Claudie Revering M.D.   On: 01/25/2017 23:07   US Renal  Result Date: 01/26/2017 CLINICAL DATA:  Acute kidney injury EXAM: RENAL / URINARY TRACT ULTRASOUND COMPLETE COMPARISON:  Overlapping  portions of CT chest 01/25/2017 FINDINGS: Right Kidney: Length: 6.5 cm. Severe cortical thinning. Echogenic cortex. No obvious mass but the kidney is only indistinctly seen and the cortex seems to have similar echogenicity to the surrounding renal sinus and perirenal adipose tissue. No definite stones. No hydronephrosis. Left Kidney: Length: 6.3 cm. Severely atrophic and echogenic. No obvious stones or mass. Pelvic transplant kidney: Length: 10.4 cm. No hydronephrosis, calculi, or discrete mass. There is a 1.8 by 1.2 by 1.7 cm simple appearing cyst in the transplant left kidney. Bladder: Mild diffuse wall irregularity probably due to nondistention. Prostate gland measured at 4.2 by 4.0 by 4.1 cm (volume = 36 cm^3). IMPRESSION: 1. No hydronephrosis or discrete abnormality of the pelvic transplant kidney aside from a 1.8 cm in long axis simple appearing cyst. 2. The native kidneys are severely atrophic and echogenic. 3. There is slight luminal irregularity in the urinary bladder diffusely, favoring nondistention over cystitis, correlate with urine analysis. Electronically Signed   By: Van Clines M.D.   On: 01/26/2017 11:48     Subjective: Seen and examined and felt slightly better but still weak. No nausea or vomiting. No Chest Pain.  Discharge Exam: Vitals:   01/28/17 0904 01/28/17 1442  BP: 136/83 124/70  Pulse: 75 75  Resp: 20 20  Temp: 97.7 F (36.5 C) 97.6 F (36.4 C)  SpO2: 97% 99%   Vitals:   01/27/17 2100 01/28/17 0408 01/28/17 0904 01/28/17 1442  BP:  (!) 149/71 136/83 124/70  Pulse:  60 75 75  Resp:  20 20 20   Temp:  97.9 F (36.6 C) 97.7 F (36.5 C) 97.6 F (36.4 C)  TempSrc:  Oral Oral Oral  SpO2:  96% 97% 99%  Weight: 67 kg (147 lb 9.6 oz)     Height:       General: Pt is alert, awake, not in acute distress Cardiovascular: RRR, S1/S2 +, no rubs, no gallops Respiratory: CTA bilaterally, no wheezing, no rhonchi Abdominal: Soft, NT, ND, bowel sounds + Extremities:  no edema, no cyanosis  The results of  significant diagnostics from this hospitalization (including imaging, microbiology, ancillary and laboratory) are listed below for reference.    Microbiology: No results found for this or any previous visit (from the past 240 hour(s)).   Labs: BNP (last 3 results)  Recent Labs  01/25/17 2007  BNP 354.6*   Basic Metabolic Panel:  Recent Labs Lab 01/25/17 2046 01/26/17 0403 01/26/17 1054 01/27/17 0221 01/27/17 1455 01/28/17 0443  NA  --  141 141 139 138 139  K  --  4.1 3.8 4.1 3.7 3.8  CL  --  107 111 108 106 108  CO2  --  23 22 22 22  21*  GLUCOSE  --  102* 91 97 147* 85  BUN  --  39* 37* 44* 40* 42*  CREATININE  --  1.75* 1.64* 1.96* 1.90* 1.75*  CALCIUM  --  10.2 10.0 9.9 10.1 9.4  MG 1.8  --   --  1.5*  --  1.5*  PHOS  --   --   --  4.5  --  4.3   Liver Function Tests:  Recent Labs Lab 01/25/17 1728 01/27/17 0221 01/28/17 0443  AST 21 18 18   ALT 16* 15* 15*  ALKPHOS 63 49 45  BILITOT 0.7 0.7 0.5  PROT 6.1* 5.6* 5.3*  ALBUMIN 3.5 3.2* 3.0*    Recent Labs Lab 01/25/17 1728 01/26/17 1054  LIPASE 55* 52*   No results for input(s): AMMONIA in the last 168 hours. CBC:  Recent Labs Lab 01/25/17 1728 01/26/17 0403 01/27/17 0221 01/28/17 0443  WBC 6.0 5.5 5.6 5.5  NEUTROABS  --   --  3.8 3.5  HGB 10.3* 9.6* 9.3* 9.0*  HCT 33.1* 30.1* 30.1* 29.0*  MCV 89.7 89.6 89.9 89.2  PLT 125* 117* 118* 107*   Cardiac Enzymes:  Recent Labs Lab 01/25/17 2341  CKTOTAL 27*   BNP: Invalid input(s): POCBNP CBG:  Recent Labs Lab 01/28/17 1229  GLUCAP 103*   D-Dimer No results for input(s): DDIMER in the last 72 hours. Hgb A1c No results for input(s): HGBA1C in the last 72 hours. Lipid Profile No results for input(s): CHOL, HDL, LDLCALC, TRIG, CHOLHDL, LDLDIRECT in the last 72 hours. Thyroid function studies  Recent Labs  01/25/17 2341  TSH 1.390   Anemia work up No results for input(s): VITAMINB12,  FOLATE, FERRITIN, TIBC, IRON, RETICCTPCT in the last 72 hours. Urinalysis    Component Value Date/Time   COLORURINE YELLOW 01/25/2017 1814   APPEARANCEUR CLEAR 01/25/2017 1814   LABSPEC 1.008 01/25/2017 1814   PHURINE 6.0 01/25/2017 1814   GLUCOSEU NEGATIVE 01/25/2017 1814   HGBUR NEGATIVE 01/25/2017 1814   BILIRUBINUR NEGATIVE 01/25/2017 1814   KETONESUR NEGATIVE 01/25/2017 1814   PROTEINUR NEGATIVE 01/25/2017 1814   UROBILINOGEN 0.2 05/10/2013 1015   NITRITE NEGATIVE 01/25/2017 1814   LEUKOCYTESUR NEGATIVE 01/25/2017 1814   Sepsis Labs Invalid input(s): PROCALCITONIN,  WBC,  LACTICIDVEN Microbiology No results found for this or any previous visit (from the past 240 hour(s)).  Time coordinating discharge: 35 minutes  SIGNED:  Kerney Elbe, DO [facility] 01/28/2017, 2:54 PM Pager [phone]  If 7PM-7AM, please contact night-coverage [email or web address] Password [email or web address]

## 2017-01-28 NOTE — Progress Notes (Signed)
Patient discharged to home with girlfriend. All discharge instructions reviewed with patient.  All scripts and changes reviewed. IV removed. All belongings in tow. Patient left unit in stable condition via wheelchair.  Sheliah Plane RN

## 2017-01-28 NOTE — Progress Notes (Signed)
ANTICOAGULATION CONSULT NOTE - Follow-Up  Pharmacy Consult for Warfarin Indication: atrial fibrillation  No Known Allergies  Patient Measurements: Height: 5\' 6"  (167.6 cm) Weight: 147 lb 9.6 oz (67 kg) IBW/kg (Calculated) : 63.8  Vital Signs: Temp: 97.7 F (36.5 C) (10/27 0904) Temp Source: Oral (10/27 0904) BP: 136/83 (10/27 0904) Pulse Rate: 75 (10/27 0904)  Labs:  Recent Labs  01/25/17 2341 01/26/17 0403  01/27/17 0221 01/27/17 1455 01/28/17 0443  HGB  --  9.6*  --  9.3*  --  9.0*  HCT  --  30.1*  --  30.1*  --  29.0*  PLT  --  117*  --  118*  --  107*  LABPROT 16.8* 17.1*  --  18.0*  --  22.4*  INR 1.37 1.41  --  1.50  --  1.99  CREATININE  --  1.75*  < > 1.96* 1.90* 1.75*  CKTOTAL 27*  --   --   --   --   --   < > = values in this interval not displayed.  Estimated Creatinine Clearance: 33.9 mL/min (A) (by C-G formula based on SCr of 1.75 mg/dL (H)).  Assessment: 41 YOM who presented on 10/24 with generalized weakness. The patient was on warfarin PTA for hx Afib - admit INR 1.37 on PTA dose of 6 mg/day. Pharmacy consulted to resume dosing this admission.   INR remains slightly subtherapeutic after increasing from 1.5 > 1.99 in last 24 hours. CBC remains stable. PTA dose: 6 mg daily    Goal of Therapy:  INR 2-3   Plan:  1. Warfarin  6 mg x 1 this evening  2. Will continue to monitor for any signs/symptoms of bleeding and will follow up with PT/INR in the a.m.   Thank you for allowing pharmacy to be a part of this patient's care.  Vincenza Hews, PharmD, BCPS 01/28/2017, 11:13 AM

## 2017-01-28 NOTE — Care Management Note (Signed)
Case Management Note  Patient Details  Name: Jeffrey Perez MRN: [id number] Date of Birth: [birth year]  Subjective/Objective:                 Spoke w patient over the phone. Discussed following up w [place] and calling to schedule apt on [place], as enetered in AVS to get INR checked. Patient agreed to [facility] but did not agree to [facility] services.    Action/Plan:  Rw to be delivered to room prior to DC. CM signing off. Expected Discharge Date:  01/28/17               Expected Discharge Plan:  Home/Self Care  In-House Referral:  NA  Discharge planning Services  CM Consult  Post Acute Care Choice:  NA Choice offered to:  NA  DME Arranged:  Walker rolling DME Agency:  NA, [place] Arranged:  NA HH Agency:  NA  Status of Service:  Completed, signed off  If discussed at [place] of Stay Meetings, dates discussed:    Additional Comments:  Carles Collet, RN 01/28/2017, 4:31 PM

## 2017-02-04 ENCOUNTER — Encounter (HOSPITAL_COMMUNITY): Payer: Self-pay | Admitting: Emergency Medicine

## 2017-02-04 ENCOUNTER — Observation Stay (HOSPITAL_COMMUNITY): Payer: Medicare Other

## 2017-02-04 ENCOUNTER — Inpatient Hospital Stay (HOSPITAL_COMMUNITY)
Admission: EM | Admit: 2017-02-04 | Discharge: 2017-02-16 | DRG: 025 | Disposition: A | Discharge status: Home Health | Payer: Medicare Other | Attending: Internal Medicine | Admitting: Internal Medicine

## 2017-02-04 ENCOUNTER — Emergency Department (HOSPITAL_COMMUNITY): Payer: Medicare Other

## 2017-02-04 DIAGNOSIS — Z515 Encounter for palliative care: Secondary | ICD-10-CM

## 2017-02-04 DIAGNOSIS — G939 Disorder of brain, unspecified: Secondary | ICD-10-CM | POA: Diagnosis not present

## 2017-02-04 DIAGNOSIS — R29810 Facial weakness: Secondary | ICD-10-CM | POA: Diagnosis not present

## 2017-02-04 DIAGNOSIS — N179 Acute kidney failure, unspecified: Secondary | ICD-10-CM | POA: Diagnosis present

## 2017-02-04 DIAGNOSIS — Z7901 Long term (current) use of anticoagulants: Secondary | ICD-10-CM

## 2017-02-04 DIAGNOSIS — Z955 Presence of coronary angioplasty implant and graft: Secondary | ICD-10-CM

## 2017-02-04 DIAGNOSIS — K5900 Constipation, unspecified: Secondary | ICD-10-CM | POA: Diagnosis present

## 2017-02-04 DIAGNOSIS — R112 Nausea with vomiting, unspecified: Secondary | ICD-10-CM

## 2017-02-04 DIAGNOSIS — I4510 Unspecified right bundle-branch block: Secondary | ICD-10-CM | POA: Diagnosis present

## 2017-02-04 DIAGNOSIS — R471 Dysarthria and anarthria: Secondary | ICD-10-CM | POA: Diagnosis present

## 2017-02-04 DIAGNOSIS — I10 Essential (primary) hypertension: Secondary | ICD-10-CM | POA: Diagnosis not present

## 2017-02-04 DIAGNOSIS — Z940 Kidney transplant status: Secondary | ICD-10-CM | POA: Diagnosis not present

## 2017-02-04 DIAGNOSIS — D631 Anemia in chronic kidney disease: Secondary | ICD-10-CM | POA: Diagnosis not present

## 2017-02-04 DIAGNOSIS — N189 Chronic kidney disease, unspecified: Secondary | ICD-10-CM | POA: Diagnosis not present

## 2017-02-04 DIAGNOSIS — D696 Thrombocytopenia, unspecified: Secondary | ICD-10-CM | POA: Diagnosis present

## 2017-02-04 DIAGNOSIS — Z66 Do not resuscitate: Secondary | ICD-10-CM

## 2017-02-04 DIAGNOSIS — D496 Neoplasm of unspecified behavior of brain: Secondary | ICD-10-CM | POA: Diagnosis present

## 2017-02-04 DIAGNOSIS — I480 Paroxysmal atrial fibrillation: Secondary | ICD-10-CM | POA: Diagnosis present

## 2017-02-04 DIAGNOSIS — Z7952 Long term (current) use of systemic steroids: Secondary | ICD-10-CM

## 2017-02-04 DIAGNOSIS — Z79899 Other long term (current) drug therapy: Secondary | ICD-10-CM

## 2017-02-04 DIAGNOSIS — G9389 Other specified disorders of brain: Secondary | ICD-10-CM | POA: Diagnosis present

## 2017-02-04 DIAGNOSIS — C711 Malignant neoplasm of frontal lobe: Principal | ICD-10-CM | POA: Diagnosis present

## 2017-02-04 DIAGNOSIS — C719 Malignant neoplasm of brain, unspecified: Secondary | ICD-10-CM

## 2017-02-04 DIAGNOSIS — D649 Anemia, unspecified: Secondary | ICD-10-CM

## 2017-02-04 DIAGNOSIS — H02402 Unspecified ptosis of left eyelid: Secondary | ICD-10-CM

## 2017-02-04 DIAGNOSIS — G936 Cerebral edema: Secondary | ICD-10-CM | POA: Diagnosis present

## 2017-02-04 DIAGNOSIS — I959 Hypotension, unspecified: Secondary | ICD-10-CM | POA: Diagnosis present

## 2017-02-04 DIAGNOSIS — M542 Cervicalgia: Secondary | ICD-10-CM

## 2017-02-04 DIAGNOSIS — H04122 Dry eye syndrome of left lacrimal gland: Secondary | ICD-10-CM | POA: Diagnosis not present

## 2017-02-04 DIAGNOSIS — I2510 Atherosclerotic heart disease of native coronary artery without angina pectoris: Secondary | ICD-10-CM | POA: Diagnosis present

## 2017-02-04 LAB — I-STAT CHEM 8, ED
BUN: 40 mg/dL — ABNORMAL HIGH (ref 6–20)
CALCIUM ION: 1.29 mmol/L (ref 1.15–1.40)
CREATININE: 1.8 mg/dL — AB (ref 0.61–1.24)
Chloride: 109 mmol/L (ref 101–111)
GLUCOSE: 119 mg/dL — AB (ref 65–99)
HCT: 33 % — ABNORMAL LOW (ref 39.0–52.0)
Hemoglobin: 11.2 g/dL — ABNORMAL LOW (ref 13.0–17.0)
Potassium: 3.8 mmol/L (ref 3.5–5.1)
Sodium: 143 mmol/L (ref 135–145)
TCO2: 25 mmol/L (ref 22–32)

## 2017-02-04 LAB — CBG MONITORING, ED: GLUCOSE-CAPILLARY: 115 mg/dL — AB (ref 65–99)

## 2017-02-04 LAB — COMPREHENSIVE METABOLIC PANEL
ALBUMIN: 3.7 g/dL (ref 3.5–5.0)
ALT: 17 U/L (ref 17–63)
ANION GAP: 8 (ref 5–15)
AST: 21 U/L (ref 15–41)
Alkaline Phosphatase: 58 U/L (ref 38–126)
BILIRUBIN TOTAL: 0.7 mg/dL (ref 0.3–1.2)
BUN: 41 mg/dL — ABNORMAL HIGH (ref 6–20)
CO2: 23 mmol/L (ref 22–32)
Calcium: 9.7 mg/dL (ref 8.9–10.3)
Chloride: 109 mmol/L (ref 101–111)
Creatinine, Ser: 1.7 mg/dL — ABNORMAL HIGH (ref 0.61–1.24)
GFR, EST AFRICAN AMERICAN: 44 mL/min — AB (ref >60)
GFR, EST NON AFRICAN AMERICAN: 38 mL/min — AB (ref >60)
Glucose, Bld: 124 mg/dL — ABNORMAL HIGH (ref 65–99)
POTASSIUM: 3.8 mmol/L (ref 3.5–5.1)
Sodium: 140 mmol/L (ref 135–145)
TOTAL PROTEIN: 6.4 g/dL — AB (ref 6.5–8.1)

## 2017-02-04 LAB — CBC
HCT: 35.7 % — ABNORMAL LOW (ref 39.0–52.0)
HEMOGLOBIN: 10.8 g/dL — AB (ref 13.0–17.0)
MCH: 28 pg (ref 26.0–34.0)
MCHC: 30.3 g/dL (ref 30.0–36.0)
MCV: 92.5 fL (ref 78.0–100.0)
Platelets: 125 10*3/uL — ABNORMAL LOW (ref 150–400)
RBC: 3.86 MIL/uL — ABNORMAL LOW (ref 4.22–5.81)
RDW: 14.9 % (ref 11.5–15.5)
WBC: 5.3 10*3/uL (ref 4.0–10.5)

## 2017-02-04 LAB — DIFFERENTIAL
Basophils Absolute: 0 10*3/uL (ref 0.0–0.1)
Basophils Relative: 1 %
EOS ABS: 0.1 10*3/uL (ref 0.0–0.7)
EOS PCT: 2 %
LYMPHS ABS: 1.3 10*3/uL (ref 0.7–4.0)
Lymphocytes Relative: 25 %
MONOS PCT: 7 %
Monocytes Absolute: 0.4 10*3/uL (ref 0.1–1.0)
NEUTROS PCT: 65 %
Neutro Abs: 3.5 10*3/uL (ref 1.7–7.7)

## 2017-02-04 LAB — PROTIME-INR
INR: 3.23
Prothrombin Time: 32.7 seconds — ABNORMAL HIGH (ref 11.4–15.2)

## 2017-02-04 LAB — APTT: aPTT: 40 seconds — ABNORMAL HIGH (ref 24–36)

## 2017-02-04 LAB — I-STAT TROPONIN, ED: TROPONIN I, POC: 0.02 ng/mL (ref 0.00–0.08)

## 2017-02-04 MED ORDER — ACETAMINOPHEN 650 MG RE SUPP: 650.0000 mg | Freq: Four times a day (QID) | RECTAL | Status: DC | PRN

## 2017-02-04 MED ORDER — DOXAZOSIN MESYLATE 8 MG PO TABS
8.0000 mg | ORAL_TABLET | Freq: Two times a day (BID) | ORAL | Status: DC
  Administered 2017-02-04 – 2017-02-12 (×15): 8 mg via ORAL
  Filled 2017-02-04 (×17): qty 1

## 2017-02-04 MED ORDER — SODIUM CHLORIDE 0.9 % IV SOLN
250.0000 mL | INTRAVENOUS | Status: DC | PRN
  Administered 2017-02-09 – 2017-02-13 (×2): 250 mL via INTRAVENOUS

## 2017-02-04 MED ORDER — SODIUM CHLORIDE 0.9 % IV BOLUS (SEPSIS)
500.0000 mL | Freq: Once | INTRAVENOUS | Status: AC
  Administered 2017-02-04: 500 mL via INTRAVENOUS

## 2017-02-04 MED ORDER — ACETAMINOPHEN 325 MG PO TABS
650.0000 mg | ORAL_TABLET | Freq: Four times a day (QID) | ORAL | Status: DC | PRN
  Administered 2017-02-11 – 2017-02-12 (×2): 650 mg via ORAL
  Filled 2017-02-04 (×2): qty 2

## 2017-02-04 MED ORDER — WARFARIN - PHARMACIST DOSING INPATIENT: Freq: Every day | Status: DC

## 2017-02-04 MED ORDER — VITAMIN D 1000 UNITS PO TABS
1000.0000 [IU] | ORAL_TABLET | Freq: Every day | ORAL | Status: DC
  Administered 2017-02-05 – 2017-02-16 (×11): 1000 [IU] via ORAL
  Filled 2017-02-04 (×11): qty 1

## 2017-02-04 MED ORDER — TACROLIMUS 1 MG PO CAPS
1.0000 mg | ORAL_CAPSULE | Freq: Two times a day (BID) | ORAL | Status: DC
  Administered 2017-02-04 – 2017-02-16 (×23): 1 mg via ORAL
  Filled 2017-02-04 (×25): qty 1

## 2017-02-04 MED ORDER — MYCOPHENOLATE SODIUM 180 MG PO TBEC
360.0000 mg | DELAYED_RELEASE_TABLET | Freq: Two times a day (BID) | ORAL | Status: DC
  Administered 2017-02-04 – 2017-02-16 (×23): 360 mg via ORAL
  Filled 2017-02-04 (×25): qty 2

## 2017-02-04 MED ORDER — PREDNISONE 5 MG PO TABS
5.0000 mg | ORAL_TABLET | Freq: Every day | ORAL | Status: DC
  Administered 2017-02-05 – 2017-02-16 (×11): 5 mg via ORAL
  Filled 2017-02-04 (×11): qty 1

## 2017-02-04 MED ORDER — SODIUM CHLORIDE 0.9% FLUSH
3.0000 mL | Freq: Two times a day (BID) | INTRAVENOUS | Status: DC
  Administered 2017-02-04 – 2017-02-14 (×10): 3 mL via INTRAVENOUS

## 2017-02-04 MED ORDER — SODIUM CHLORIDE 0.9% FLUSH: 3.0000 mL | INTRAVENOUS | Status: DC | PRN

## 2017-02-04 MED ORDER — GADOBENATE DIMEGLUMINE 529 MG/ML IV SOLN
7.0000 mL | Freq: Once | INTRAVENOUS | Status: AC
  Administered 2017-02-04: 7 mL via INTRAVENOUS

## 2017-02-04 NOTE — ED Notes (Signed)
Attempted report x1. 

## 2017-02-04 NOTE — H&P (Signed)
[facility] History and Physical  Jeffrey Perez GYF:[id number] DOB: 06/07/1945 DOA: 02/04/2017  PCP: Patient, No Pcp Per  Patient coming from: Home   Chief Complaint: Left facial droop  HPI: Jeffrey Perez is a 71 y.o. male with a medical history of renal transplant/end-stage renal disease, gout, hypertension, anemia, who presented to the emergency department with complaints of left facial droop and concern for stroke. Patient was recently admitted and discharged on 01/28/2017 for generalized weakness. Patient reports that since leaving the hospital last week, he started having some left-sided weakness of his face. Family friend present at bedside, reported that she noticed it was worse today. Patient also reported some difficulty walking and dizziness. Denies any recent illness, travel. Denies chest pain, shortness of breath, abdominal pain, nausea vomiting, diarrhea or constipation, changes in urinary or bowel habits.   ED Course: Code stroke called, CT head showed questionable mass. Neurology consulted. TRH called for admission.  Review of Systems:  All other systems reviewed and are negative.   Past Medical History:  Diagnosis Date  . Acute kidney injury ([place]) 01/25/2017   Archie Endo 01/25/2017  . Anemia   . Arthritis    'all over my bones" (01/26/2017)  . Coronary artery disease   . ESRD (end stage renal disease) on dialysis [facility]) 1992-1994   "stopped when I got the transplant"  . Gout   . History of blood transfusion    "very many times" (01/26/2017)  . Hypertension     Past Surgical History:  Procedure Laterality Date  . APPENDECTOMY  1970s  . AV FISTULA PLACEMENT Left 1992   "not working anymore" (01/26/2017)  . CATARACT EXTRACTION W/ INTRAOCULAR LENS  IMPLANT, BILATERAL Bilateral   . COLONOSCOPY  ~ 2016  . CORONARY ANGIOPLASTY WITH STENT PLACEMENT  ~ 2003  . INGUINAL HERNIA REPAIR Left 2000s  . KIDNEY TRANSPLANT Left 1994    Social History:  reports that he  quit smoking about 53 years ago. His smoking use included Cigarettes. He quit after 5.00 years of use. He has never used smokeless tobacco. He reports that he does not drink alcohol or use drugs.  No Known Allergies  Family history Negative for diabetes, coronary artery disease, hypertension.  Prior to Admission medications   Medication Sig Start Date End Date Taking? Authorizing Provider  cholecalciferol (VITAMIN D) 1000 units tablet Take 1,000 Units by mouth daily.   Yes [provider]  Cyanocobalamin (VITAMIN B-12 PO) Take by mouth daily.   Yes [provider]  doxazosin (CARDURA) 8 MG tablet Take 8 mg by mouth 2 (two) times daily.   Yes [provider]  Mycophenolate Sodium (MYFORTIC PO) Take 360 mg by mouth 2 (two) times daily.    Yes [provider]  predniSONE (DELTASONE) 5 MG tablet Take 10 mg (2 tablets) po Daily x 3 more days and then go back to 5 mg po Daily 01/28/17  Yes Sheikh, Omair Latif, DO  Pyridoxine HCl (VITAMIN B-6 PO) Take by mouth daily.   Yes [provider]  tacrolimus (PROGRAF) 1 MG capsule Take 1 mg by mouth 2 (two) times daily.    Yes [provider]  warfarin (COUMADIN) 1 MG tablet Take 1 tablet (1 mg total) by mouth daily. Patient taking differently: Take 1 mg by mouth at bedtime.  01/28/17 01/28/18 Yes Sheikh, Omair Latif, DO  warfarin (COUMADIN) 5 MG tablet Take 5 mg by mouth at bedtime.    Yes [provider]  aspirin EC 81  MG EC tablet Take 1 tablet (81 mg total) by mouth daily. Patient not taking: Reported on 02/04/2017 01/29/17   Kerney Elbe, DO    Physical Exam: Vitals:   02/04/17 1600 02/04/17 1630  BP: 129/64 117/70  Pulse: 64 (!) 59  Resp: (!) 22 12  Temp:    SpO2: 97% 100%    General: Well developed, well nourished, NAD, appears stated age  HEENT: NCAT, PERRLA, EOMI, Anicteic Sclera, mucous membranes moist. Left facial droop  Neck: Supple, no JVD, no  masses  Cardiovascular: S1 S2 auscultated, 1/6 SEM. Regular rate and rhythm.  Respiratory: Clear to auscultation bilaterally with equal chest rise  Abdomen: Soft, nontender, nondistended, + bowel sounds  Extremities: warm dry without cyanosis clubbing or edema  Neuro: AAOx3, visual field deficits with leftward gaze, cannot blink left eye . Strength 5/5 in patient's upper and lower extremities bilaterally  Skin: Without rashes exudates or nodules  Psych: Normal affect and demeanor with intact judgement and insight  Labs on Admission: I have personally reviewed following labs and imaging studies CBC:  Recent Labs Lab 02/04/17 1507 02/04/17 1526  WBC 5.3  --   NEUTROABS 3.5  --   HGB 10.8* 11.2*  HCT 35.7* 33.0*  MCV 92.5  --   PLT 125*  --    Basic Metabolic Panel:  Recent Labs Lab 02/04/17 1507 02/04/17 1526  NA 140 143  K 3.8 3.8  CL 109 109  CO2 23  --   GLUCOSE 124* 119*  BUN 41* 40*  CREATININE 1.70* 1.80*  CALCIUM 9.7  --    GFR: Estimated Creatinine Clearance: 33 mL/min (A) (by C-G formula based on SCr of 1.8 mg/dL (H)). Liver Function Tests:  Recent Labs Lab 02/04/17 1507  AST 21  ALT 17  ALKPHOS 58  BILITOT 0.7  PROT 6.4*  ALBUMIN 3.7   No results for input(s): LIPASE, AMYLASE in the last 168 hours. No results for input(s): AMMONIA in the last 168 hours. Coagulation Profile:  Recent Labs Lab 02/04/17 1507  INR 3.23   Cardiac Enzymes: No results for input(s): CKTOTAL, CKMB, CKMBINDEX, TROPONINI in the last 168 hours. BNP (last 3 results) No results for input(s): PROBNP in the last 8760 hours. HbA1C: No results for input(s): HGBA1C in the last 72 hours. CBG:  Recent Labs Lab 02/04/17 1532  GLUCAP 115*   Lipid Profile: No results for input(s): CHOL, HDL, LDLCALC, TRIG, CHOLHDL, LDLDIRECT in the last 72 hours. Thyroid Function Tests: No results for input(s): TSH, T4TOTAL, FREET4, T3FREE, THYROIDAB in the last 72 hours. Anemia  Panel: No results for input(s): VITAMINB12, FOLATE, FERRITIN, TIBC, IRON, RETICCTPCT in the last 72 hours. Urine analysis:    Component Value Date/Time   COLORURINE YELLOW 01/25/2017 1814   APPEARANCEUR CLEAR 01/25/2017 1814   LABSPEC 1.008 01/25/2017 1814   PHURINE 6.0 01/25/2017 1814   GLUCOSEU NEGATIVE 01/25/2017 1814   HGBUR NEGATIVE 01/25/2017 1814   BILIRUBINUR NEGATIVE 01/25/2017 1814   KETONESUR NEGATIVE 01/25/2017 1814   PROTEINUR NEGATIVE 01/25/2017 1814   UROBILINOGEN 0.2 05/10/2013 1015   NITRITE NEGATIVE 01/25/2017 1814   LEUKOCYTESUR NEGATIVE 01/25/2017 1814   Sepsis Labs: @LABRCNTIP (procalcitonin:4,lacticidven:4) )No results found for this or any previous visit (from the past 240 hour(s)).   Radiological Exams on Admission: Ct Head Code Stroke Wo Contrast  Result Date: 02/04/2017 CLINICAL DATA:  Code stroke. 71 year old male with left facial droop and abnormal gait. Unknown time of onset. EXAM: CT HEAD WITHOUT CONTRAST TECHNIQUE: Contiguous  axial images were obtained from the base of the skull through the vertex without intravenous contrast. COMPARISON:  None. FINDINGS: Brain: There is architectural distortion along the anterior corpus callosum, such that no normal genu or rostrum are identified. There is suggestion posterior mass effect on the right frontal horn on series 3, image 15. There is associated abnormal hypodensity in both anterior inferior frontal gyri which more resembles vasogenic edema (instead of cytotoxic edema) series 3, image 13. In the genu region there is abnormal cortical thickening (primarily in the right hemisphere) and mass like heterogeneity as seen on coronal image 24. No acute intracranial hemorrhage identified. No ventriculomegaly. No areas suggestive of cytotoxic edema identified. The body and splenium of the corpus callosum appear normal. No cortical encephalomalacia identified. Vascular: Calcified atherosclerosis at the skull base. No suspicious  intracranial vascular hyperdensity. Skull: No acute osseous abnormality identified. Sinuses/Orbits: Clear. Other: Calcified scalp vasculature. No acute orbit or scalp soft tissue findings. ASPECTS [facility] Stroke Program Early CT Score) Total score (0-10 with 10 being normal): 10. IMPRESSION: 1. Abnormal appearance of both anterior inferior frontal lobes and the anterior corpus callosum, but more resembling a tumor which has crossed midline rather than infarct. Recommend Brain MRI without and with contrast when possible. 2. No acute intracranial hemorrhage. 3. ASPECTS is 10. 4. The above was relayed via text pager to Dr. Jerelyn Charles on 02/04/2017 at 1541 hours. Electronically Signed   By: Genevie Ann M.D.   On: 02/04/2017 15:45    EKG: Independently reviewed. Sinus rhythm, rate 79, right bundle branch block, PVC  Assessment/Plan  Left facial droop/Possible brain mass -?Bells palsy -CT head: Abnormal appearance of both anterior inferior frontal lobes and anterior corpus callosum, resembling a tumor which has crossed midline rather than infarct.  -MRI with and without contrast -Neurology consulted and appreciated -Discussed with Dr. Rory Percy, will hold off on using decadron and antiepileptic medications until MRI is done  History of renal transplant/ESRD -Transplant occurred 25 years ago, no longer on dialysis -Unknown baseline creatinine, currently 1.8 -Continue monitor BMP -Continue mycophenolate, prednisone, tacrolimus  Paroxysmal atrial fibrillation -Currently in sinus rhythm -CHADSVASC 2 (based on age and HTN) -Continue Coumadin per pharmacy  Chronic mesenteric anemia/thrombocytopenia -Hemoglobin and platelets appears to be at baseline -Continue monitor CBC  Essential hypertension -Continue Cardura  DVT prophylaxis: Coumadin  Code Status: Full  Family Communication: Friend at bedside. Admission, patients condition and plan of care including tests being ordered have been discussed with the  patient and friend who indicate understanding and agree with the plan and Code Status.  Disposition Plan: Home  Consults called: Neurology   Admission status: observation   Time spent: 70 minutes  Pria Klosinski D.O. [facility] Pager [phone]  If 7PM-7AM, please contact night-coverage [email or web address] Password [employer] 02/04/2017, 5:23 PM

## 2017-02-04 NOTE — ED Notes (Signed)
Report given to Levada Dy, RN on 3W, floor aware pt is in MRI at this time.

## 2017-02-04 NOTE — ED Notes (Signed)
Patient transported to MRI 

## 2017-02-04 NOTE — Code Documentation (Signed)
71 y.o. Male with PMHx of ESRD s/p renal transplant, HTN, PAF on Coumadin, who was recently discharged from the hospital on 01/28/2017 for generalized weakness that had been ongoing for 2 weeks. Today, the patient's girlfriend noted the patient to have an increase in his left-sided facial droop and slurred speech. Per report, the patient has been experiencing mild facial droop for about 1 week. Today it is being reported that the facial droop has increased in severity, thus promoting the patient to present to [facility] ED. LKW determined to be sometime on 01/28/2017. Exact time is unknown.    On exam, patient with mild left-sided facial droop affecting the upper and lower portion of the face and a possible left hemianopsia as the patient endorsed difficulties seeing out of his left visual field. NIHSS 3. VAN (-). See EMR for NIHSS and code stroke times. Per radiology report, CT revealed an abnormal appearance of both anterior inferior frontal lobes and the anterior corpus callosum. IV tPA not given d/t the patient being on Coumadin. Not a NIR candidate due to no LVO. ED bedside handoff with ED RN Donaldson.

## 2017-02-04 NOTE — Consult Note (Signed)
Neurology Consultation  Reason for Consult: Code stroke Referring Physician: Dr. Jeanell Sparrow  CC: Left facial droop, slurred speech  History is obtained from: Patient, friend  HPI: Jeffrey Perez is a 71 y.o. male who has a past medical history of ESRD status post renal transplant, hypertension, paroxysmal atrial fibrillation on Coumadin, who was recently discharged from the hospital on 01/28/2017 for generalized weakness that had been ongoing for 2 weeks. Today, he was brought in as an acute code stroke when his girlfriend noted that he has left facial droop was worse and his speech was slurred.  He reports that his facial droop and speech has been worsening for the past at least 1 week at this time.  Today it is very obvious that he has a facial droop, but this was not very obvious when his girlfriend last saw him a few days ago.  There was another friend in the room with seen him about a week ago, when he did not have this facial droop.  He also reports inability to see her on his left visual field, which is also new but has been present for at least past 2-3 days if not the whole past week since when he has had the facial droop and slurred speech. His speech is still comprehensible.  He denied any fevers or chills preceding this but reports significant amount of weakness and possibly some weight loss as well over the past few weeks to months. Also has had headaches for the last few weeks.  LKW: 01/28/2017 unknown time tpa given?: no, less likely stroke more likely mass Premorbid modified Rankin scale (mRS): 3 ICH Score:    ROS: A 14 point ROS was performed and is negative except as noted in the HPI.  Significant for generalized fatigue, and possibly weight loss.  Denies night sweats or chills.  Denies fevers.  Denies chest pain shortness of breath or palpitations.  Denies focal weakness tingling or numbness.  Reports headache and visual symptoms Significant for headaches  Past Medical History:   Diagnosis Date  . Acute kidney injury ([place]) 01/25/2017   Archie Endo 01/25/2017  . Anemia   . Arthritis    'all over my bones" (01/26/2017)  . Coronary artery disease   . ESRD (end stage renal disease) on dialysis [facility]) 1992-1994   "stopped when I got the transplant"  . Gout   . History of blood transfusion    "very many times" (01/26/2017)  . Hypertension     No family history on file.  Social History:   reports that he quit smoking about 53 years ago. His smoking use included Cigarettes. He quit after 5.00 years of use. He has never used smokeless tobacco. He reports that he does not drink alcohol or use drugs. Medications  Current Facility-Administered Medications:  .  sodium chloride 0.9 % bolus 500 mL, 500 mL, Intravenous, Once, Pattricia Boss, MD, Last Rate: 500 mL/hr at 02/04/17 1615, 500 mL at 02/04/17 1615  Current Outpatient Prescriptions:  .  cholecalciferol (VITAMIN D) 1000 units tablet, Take 1,000 Units by mouth daily., Disp: , Rfl:  .  Cyanocobalamin (VITAMIN B-12 PO), Take by mouth daily., Disp: , Rfl:  .  doxazosin (CARDURA) 8 MG tablet, Take 8 mg by mouth 2 (two) times daily., Disp: , Rfl:  .  Mycophenolate Sodium (MYFORTIC PO), Take 360 mg by mouth 2 (two) times daily. , Disp: , Rfl:  .  predniSONE (DELTASONE) 5 MG tablet, Take 10 mg (2 tablets) po Daily x  3 more days and then go back to 5 mg po Daily, Disp: 33 tablet, Rfl: 0 .  Pyridoxine HCl (VITAMIN B-6 PO), Take by mouth daily., Disp: , Rfl:  .  tacrolimus (PROGRAF) 1 MG capsule, Take 1 mg by mouth 2 (two) times daily. , Disp: , Rfl:  .  warfarin (COUMADIN) 1 MG tablet, Take 1 tablet (1 mg total) by mouth daily. (Patient taking differently: Take 1 mg by mouth at bedtime. ), Disp: 30 tablet, Rfl: 11 .  warfarin (COUMADIN) 5 MG tablet, Take 5 mg by mouth at bedtime. , Disp: , Rfl:  .  aspirin EC 81 MG EC tablet, Take 1 tablet (81 mg total) by mouth daily. (Patient not taking: Reported on 02/04/2017), Disp: 30 tablet,  Rfl: 0  Exam: Current vital signs: BP (!) 138/52   Pulse 61   Temp (!) 97.5 F (36.4 C) (Oral)   Resp 16   SpO2 97%  Vital signs in last 24 hours: Temp:  [97.5 F (36.4 C)] 97.5 F (36.4 C) (11/03 1521) Pulse Rate:  [61-79] 61 (11/03 1540) Resp:  [16-20] 16 (11/03 1540) BP: (115-138)/(52-83) 138/52 (11/03 1539) SpO2:  [97 %-99 %] 97 % (11/03 1540) GENERAL: Awake, alert in NAD HEENT: - Normocephalic and atraumatic, dry mm, no LN++, no Thyromegally LUNGS - Clear to auscultation bilaterally with no wheezes CV - S1S2 RRR, no m/r/g, equal pulses bilaterally. ABDOMEN - Soft, nontender, nondistended with normoactive BS Ext: warm, well perfused, intact peripheral pulses,no edema  NEURO:  Mental Status: AA&Ox3  Language: speech is mildly dysarthric.  Naming, repetition, fluency, and comprehension intact. Cranial Nerves: PERRL 47mm/brisk. EOMI, visual fields examination shows possible left hemianopsia, left facial weakness and a lower motor neuron fashion with weakness of the upper face as well as the lower face, inability to close the eyelid completely and eye rolling up and trying to close the eyes.   hearing intact, tongue/uvula/soft palate midline, normal sternocleidomastoid and trapezius muscle strength. No evidence of tongue atrophy or fibrillations Motor: 5/5 all over Tone: is normal and bulk is normal Sensation- Intact to light touch bilaterally Coordination: FTN intact bilaterally, no ataxia in BLE. Gait- deferred  NIHSS -total of 3 (2-facial, 1-dysarthria)   Labs I have reviewed labs in epic and the results pertinent to this consultation are:  CBC    Component Value Date/Time   WBC 5.3 02/04/2017 1507   RBC 3.86 (L) 02/04/2017 1507   HGB 11.2 (L) 02/04/2017 1526   HCT 33.0 (L) 02/04/2017 1526   PLT 125 (L) 02/04/2017 1507   MCV 92.5 02/04/2017 1507   MCH 28.0 02/04/2017 1507   MCHC 30.3 02/04/2017 1507   RDW 14.9 02/04/2017 1507   LYMPHSABS 1.3 02/04/2017 1507    MONOABS 0.4 02/04/2017 1507   EOSABS 0.1 02/04/2017 1507   BASOSABS 0.0 02/04/2017 1507    CMP     Component Value Date/Time   NA 143 02/04/2017 1526   K 3.8 02/04/2017 1526   CL 109 02/04/2017 1526   CO2 23 02/04/2017 1507   GLUCOSE 119 (H) 02/04/2017 1526   BUN 40 (H) 02/04/2017 1526   CREATININE 1.80 (H) 02/04/2017 1526   CREATININE 1.46 (H) 05/10/2013 1015   CALCIUM 9.7 02/04/2017 1507   PROT 6.4 (L) 02/04/2017 1507   ALBUMIN 3.7 02/04/2017 1507   AST 21 02/04/2017 1507   ALT 17 02/04/2017 1507   ALKPHOS 58 02/04/2017 1507   BILITOT 0.7 02/04/2017 1507   GFRNONAA 38 (L) 02/04/2017  [place] (L) 02/04/2017 1507   Imaging I have reviewed the images obtained: CT-scan of the brain -reviewed with neuroradiologist IMPRESSION: 1. Abnormal appearance of both anterior inferior frontal lobes and the anterior corpus callosum, but more resembling a tumor which has crossed midline rather than infarct. Recommend Brain MRI without and with contrast when possible. 2. No acute intracranial hemorrhage. 3. ASPECTS is 10."   Assessment:  71 year old man with a past medical history as mentioned above with worsening left facial droop and dysarthria. CT scan suggestive of abnormal appearance of both anterior inferior frontal lobes and anterior corpus callosum, which has more semblance to a tumor than an infarct. Given his history of immune suppression, this is likely. No TPA for low stroke scale and possible brain mass.  Impression: Left facial droop, Dysarthria -with brain imaging possibly suggestive of brain mass Evaluate for stroke  Recommendations: At this time, recommend brain MRI with and without contrast I would recommend admission for observation Once the MRI is completed and we have more information, consider neurosurgery and neuro-oncology consult if MR imaging is also suggestive of a brain mass. I do not see any utility of using antiepileptics since he has not had  any seizure. Dexamethasone 4 mg every 6 hours can be used for reduction of cerebral edema.  Since the progression is not acute, I would wait for the MRI to come back prior to starting this. Maintain seizure precautions given a brain mass Management of toxic metabolic derangements per primary team as you are.  -- Amie [place], MD [facility] [phone] If 7pm to 7am, please call on call as listed on AMION.

## 2017-02-04 NOTE — Progress Notes (Signed)
ANTICOAGULATION CONSULT NOTE - Initial Consult  Pharmacy Consult for warfarin Indication: atrial fibrillation  No Known Allergies  Patient Measurements:     Vital Signs: Temp: 97.6 F (36.4 C) (11/03 1726) Temp Source: Oral (11/03 1521) BP: 136/65 (11/03 1730) Pulse Rate: 61 (11/03 1730)  Labs:  Recent Labs  02/04/17 1507 02/04/17 1526  HGB 10.8* 11.2*  HCT 35.7* 33.0*  PLT 125*  --   APTT 40*  --   LABPROT 32.7*  --   INR 3.23  --   CREATININE 1.70* 1.80*    Estimated Creatinine Clearance: 33 mL/min (A) (by C-G formula based on SCr of 1.8 mg/dL (H)).    Assessment: 1 YOM seen in ED with facial drop- has been worsening over last several days to 1 week. Recently admitted for generalized weakness.  On warfarin for AFib. Home dose was 5mg  daily. INR today elevated at 3.23. Hgb 10.8, plts 125.  Patient seen by neuro for stroke like symptoms- code stroke cancelled. To get MRI for brain mass seen on CT.  Goal of Therapy:  INR 2-3 Monitor platelets by anticoagulation protocol: Yes   Plan:  Hold warfarin with elevated INR Daily INR and CBC Follow s/s bleeding Follow neuro workup  Sapphire Tygart D. Gopal Malter, PharmD, BCPS Clinical Pharmacist  [phone] 02/04/2017 6:04 PM

## 2017-02-04 NOTE — Progress Notes (Signed)
Patient reports having had Flu vaccine this year in September 12/2016.

## 2017-02-04 NOTE — ED Provider Notes (Signed)
[facility]ERGENCY DEPARTMENT Provider Note   CSN: [id number] Arrival date & time: 02/04/17  1516     History   Chief Complaint Chief Complaint  Patient presents with  . Facial Droop    HPI Jeffrey Perez is a 71 y.o. male.  HPI 71 year old man presents today with left-sided facial droop he reports that this has been present since last week.  Family friend present with him states that it has worsened today.  He also reports difficulty walking.  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  He was recently admitted secondary to acute kidney injury and anemia.  He denies any recent head injury, fall, fever. Past Medical History:  Diagnosis Date  . Acute kidney injury ([place]) 01/25/2017   Archie Endo 01/25/2017  . Anemia   . Arthritis    'all over my bones" (01/26/2017)  . Coronary artery disease   . ESRD (end stage renal disease) on dialysis [facility]) 1992-1994   "stopped when I got the transplant"  . Gout   . History of blood transfusion    "very many times" (01/26/2017)  . Hypertension     Patient Active Problem List   Diagnosis Date Noted  . Generalized weakness 01/27/2017  . Normocytic anemia 01/25/2017  . Thrombocytopenia ([place]) 01/25/2017  . Renal insufficiency 01/25/2017  . General weakness 01/25/2017  . Hypertension 06/06/2012  . History of renal transplant 06/06/2012  . Chronic anticoagulation 06/06/2012  . Paroxysmal atrial fibrillation ([place]) 05/30/2012  . Gout 05/30/2012    Past Surgical History:  Procedure Laterality Date  . APPENDECTOMY  1970s  . AV FISTULA PLACEMENT Left 1992   "not working anymore" (01/26/2017)  . CATARACT EXTRACTION W/ INTRAOCULAR LENS  IMPLANT, BILATERAL Bilateral   . COLONOSCOPY  ~ 2016  . CORONARY ANGIOPLASTY WITH STENT PLACEMENT  ~ 2003  . INGUINAL HERNIA REPAIR Left 2000s  . KIDNEY TRANSPLANT Left 1994       Home Medications    Prior to Admission medications   Medication Sig Start Date End Date  Taking? Authorizing Provider  aspirin EC 81 MG EC tablet Take 1 tablet (81 mg total) by mouth daily. 01/29/17   Raiford Noble Latif, DO  cholecalciferol (VITAMIN D) 1000 units tablet Take 1,000 Units by mouth daily.    [provider]  COLCHICINE PO Take 0.6 mg by mouth daily as needed (gout).     [provider]  Cyanocobalamin (VITAMIN B-12 PO) Take by mouth daily.    [provider]  doxazosin (CARDURA) 8 MG tablet Take 8 mg by mouth 2 (two) times daily.    [provider]  Mycophenolate Sodium (MYFORTIC PO) Take 360 mg by mouth 2 (two) times daily.     [provider]  predniSONE (DELTASONE) 5 MG tablet Take 10 mg (2 tablets) po Daily x 3 more days and then go back to 5 mg po Daily 01/28/17   Raiford Noble Latif, DO  Pyridoxine HCl (VITAMIN B-6 PO) Take by mouth daily.    [provider]  tacrolimus (PROGRAF) 1 MG capsule Take 2 mg by mouth 2 (two) times daily.    [provider]  warfarin (COUMADIN) 1 MG tablet Take 1 tablet (1 mg total) by mouth daily. 01/28/17 01/28/18  Raiford Noble Latif, DO  warfarin (COUMADIN) 6 MG tablet Take 6 mg by mouth daily.    [provider]    Family History No family history on file.  Social History Social History  Substance Use Topics  . Smoking status: Former Smoker    Years: 5.00    Types: Cigarettes    Quit date: 1965  . Smokeless tobacco: Never Used  . Alcohol use No     Allergies   Patient has no known allergies.   Review of Systems Review of Systems  All other systems reviewed and are negative.    Physical Exam Updated Vital Signs BP 115/83 (BP Location: Right Arm)   Pulse 79   Temp (!) 97.5 F (36.4 C) (Oral)   Resp 20   SpO2 99%   Physical Exam  Constitutional: He is oriented to person, place, and time. He appears well-developed and well-nourished.  HENT:  Head: Normocephalic and atraumatic.  Right Ear: External ear normal.  Mouth/Throat:  Oropharynx is clear and moist.  Eyes: Pupils are equal, round, and reactive to light. Conjunctivae are normal.  Eyes do not gaze to left beyond midline  Neck: Normal range of motion.  Pulmonary/Chest: Effort normal and breath sounds normal.  Abdominal: Soft. Bowel sounds are normal.  Musculoskeletal: Normal range of motion. He exhibits no edema.  Neurological: He is alert and oriented to person, place, and time. He displays normal reflexes. A cranial nerve deficit is present. No sensory deficit. He exhibits normal muscle tone. Coordination normal.  Skin: Skin is warm and dry. Capillary refill takes less than 2 seconds.  Psychiatric: He has a normal mood and affect.  Nursing note and vitals reviewed.    ED Treatments / Results  Labs (all labs ordered are listed, but only abnormal results are displayed) Labs Reviewed  CBC - Abnormal; Notable for the following:       Result Value   RBC 3.86 (*)    Hemoglobin 10.8 (*)    HCT 35.7 (*)    Platelets 125 (*)    All other components within normal limits  CBG MONITORING, ED - Abnormal; Notable for the following:    Glucose-Capillary 115 (*)    All other components within normal limits  I-STAT CHEM 8, ED - Abnormal; Notable for the following:    BUN 40 (*)    Creatinine, Ser 1.80 (*)    Glucose, Bld 119 (*)    Hemoglobin 11.2 (*)    HCT 33.0 (*)    All other components within normal limits  DIFFERENTIAL  PROTIME-INR  APTT  COMPREHENSIVE METABOLIC PANEL  I-STAT TROPONIN, ED    EKG  EKG Interpretation  Date/Time:  Saturday February 04 2017 15:17:38 EDT Ventricular Rate:  79 PR Interval:  178 QRS Duration: 148 QT Interval:  426 QTC Calculation: 488 R Axis:   -21 Text Interpretation:  Sinus rhythm with frequent and consecutive Premature ventricular complexes Right bundle branch block Possible Lateral infarct , age undetermined Abnormal ECG NO STEMI Otherwise no significant change Confirmed by Addison Lank [phone]) on 02/04/2017  3:33:43 PM       Radiology Ct Head Code Stroke Wo Contrast  Result Date: 02/04/2017 CLINICAL DATA:  Code stroke. 71 year old male with left facial droop and abnormal gait. Unknown time of onset. EXAM: CT HEAD WITHOUT CONTRAST TECHNIQUE: Contiguous axial images were obtained from the base of the skull through the vertex without intravenous contrast. COMPARISON:  None. FINDINGS: Brain: There is architectural distortion along the anterior corpus callosum, such that no normal genu or rostrum are identified. There is suggestion posterior mass effect on the right frontal horn on series 3, image 15. There is associated abnormal hypodensity in both anterior inferior frontal  gyri which more resembles vasogenic edema (instead of cytotoxic edema) series 3, image 13. In the genu region there is abnormal cortical thickening (primarily in the right hemisphere) and mass like heterogeneity as seen on coronal image 24. No acute intracranial hemorrhage identified. No ventriculomegaly. No areas suggestive of cytotoxic edema identified. The body and splenium of the corpus callosum appear normal. No cortical encephalomalacia identified. Vascular: Calcified atherosclerosis at the skull base. No suspicious intracranial vascular hyperdensity. Skull: No acute osseous abnormality identified. Sinuses/Orbits: Clear. Other: Calcified scalp vasculature. No acute orbit or scalp soft tissue findings. ASPECTS [facility] Stroke Program Early CT Score) Total score (0-10 with 10 being normal): 10. IMPRESSION: 1. Abnormal appearance of both anterior inferior frontal lobes and the anterior corpus callosum, but more resembling a tumor which has crossed midline rather than infarct. Recommend Brain MRI without and with contrast when possible. 2. No acute intracranial hemorrhage. 3. ASPECTS is 10. 4. The above was relayed via text pager to Dr. Jerelyn Charles on 02/04/2017 at 1541 hours. Electronically Signed   By: Genevie Ann M.D.   On: 02/04/2017 15:45     Procedures Procedures (including critical care time)  Medications Ordered in ED Medications  sodium chloride 0.9 % bolus 500 mL (not administered)     Initial Impression / Assessment and Plan / ED Course  I have reviewed the triage vital signs and the nursing notes.  Pertinent labs & imaging results that were available during my care of the patient were reviewed by me and considered in my medical decision making (see chart for details).     Seen as code stroke on initial evaluation and airway clear.  Patient seen in coordination with stroke team.  Dr. Malen Gauze saw and evaluated.  He does not feel this is an acute stroke.  Patient with isolated left facial droop initially thought to possibly represent Bell's palsy.  However, given abnormality on head CT this could represent Ms. from stroke or mass.  Given the concern for his abnormalities on head CT he will have an MRI.  The plan is to admit him for further evaluation and possible consultation regarding the prior brain mass. Discharge 1 week ago after admission for weakness, acute kidney injury, and anemia.  Today his kidney function appears stable and his anemia appears stable.  No prior head CTs or head imaging are available.  Final Clinical Impressions(s) / ED Diagnoses   Final diagnoses:  Facial droop  AKI (acute kidney injury) ([place])  Anemia, unspecified type    New Prescriptions New Prescriptions   No medications on file     Pattricia Boss, MD 02/04/17 1719

## 2017-02-04 NOTE — ED Triage Notes (Signed)
Pt and family report left sided facial droop that started 1 week ago, pt noticed that facial droop progressed starting at 1330 today. Pt A&O x 4, no other neuro deficits.

## 2017-02-05 ENCOUNTER — Encounter (HOSPITAL_COMMUNITY): Payer: Self-pay

## 2017-02-05 ENCOUNTER — Other Ambulatory Visit: Payer: Self-pay

## 2017-02-05 DIAGNOSIS — R29810 Facial weakness: Secondary | ICD-10-CM | POA: Diagnosis not present

## 2017-02-05 DIAGNOSIS — G939 Disorder of brain, unspecified: Secondary | ICD-10-CM | POA: Diagnosis not present

## 2017-02-05 DIAGNOSIS — N179 Acute kidney failure, unspecified: Secondary | ICD-10-CM

## 2017-02-05 DIAGNOSIS — D649 Anemia, unspecified: Secondary | ICD-10-CM | POA: Diagnosis not present

## 2017-02-05 LAB — BASIC METABOLIC PANEL
ANION GAP: 8 (ref 5–15)
BUN: 36 mg/dL — ABNORMAL HIGH (ref 6–20)
CO2: 24 mmol/L (ref 22–32)
Calcium: 9.2 mg/dL (ref 8.9–10.3)
Chloride: 108 mmol/L (ref 101–111)
Creatinine, Ser: 1.69 mg/dL — ABNORMAL HIGH (ref 0.61–1.24)
GFR calc Af Amer: 45 mL/min — ABNORMAL LOW (ref >60)
GFR calc non Af Amer: 38 mL/min — ABNORMAL LOW (ref >60)
GLUCOSE: 85 mg/dL (ref 65–99)
POTASSIUM: 4.4 mmol/L (ref 3.5–5.1)
Sodium: 140 mmol/L (ref 135–145)

## 2017-02-05 LAB — PROTIME-INR
INR: 3.14
Prothrombin Time: 32 seconds — ABNORMAL HIGH (ref 11.4–15.2)

## 2017-02-05 LAB — CBC
HEMATOCRIT: 32.1 % — AB (ref 39.0–52.0)
Hemoglobin: 10 g/dL — ABNORMAL LOW (ref 13.0–17.0)
MCH: 28.4 pg (ref 26.0–34.0)
MCHC: 31.2 g/dL (ref 30.0–36.0)
MCV: 91.2 fL (ref 78.0–100.0)
Platelets: 112 10*3/uL — ABNORMAL LOW (ref 150–400)
RBC: 3.52 MIL/uL — AB (ref 4.22–5.81)
RDW: 14.5 % (ref 11.5–15.5)
WBC: 5.2 10*3/uL (ref 4.0–10.5)

## 2017-02-05 MED ORDER — PNEUMOCOCCAL VAC POLYVALENT 25 MCG/0.5ML IJ INJ
0.5000 mL | INJECTION | INTRAMUSCULAR | Status: DC
  Filled 2017-02-05: qty 0.5

## 2017-02-05 NOTE — Progress Notes (Signed)
Neurology Progress Note   S:// No acute overnight complaints   O:// Current vital signs: BP 129/77   Pulse 65   Temp (!) 97.3 F (36.3 C) (Oral)   Resp 16   Wt 65.1 kg (143 lb 9.6 oz)   SpO2 97%   BMI 23.18 kg/m  Vital signs in last 24 hours: Temp:  [97.3 F (36.3 C)-98.1 F (36.7 C)] 97.3 F (36.3 C) (11/04 9379) Pulse Rate:  [58-79] 65 (11/04 0304) Resp:  [11-22] 16 (11/04 0304) BP: (115-151)/(52-109) 129/77 (11/04 0843) SpO2:  [97 %-100 %] 97 % (11/04 0643) Weight:  [65.1 kg (143 lb 9.6 oz)] 65.1 kg (143 lb 9.6 oz) (11/03 1949) No change in exam GENERAL: Awake, alert in NAD HEENT: - Normocephalic and atraumatic, dry mm, no LN++, no Thyromegally LUNGS - Clear to auscultation bilaterally with no wheezes CV - S1S2 RRR, no m/r/g, equal pulses bilaterally. ABDOMEN - Soft, nontender, nondistended with normoactive BS Ext: warm, well perfused, intact peripheral pulses,no edema  NEURO:  Mental Status: AA&Ox3  Language: speech is mildly dysarthric.  Naming, repetition, fluency, and comprehension intact. Cranial Nerves: PERRL 64mm/brisk. EOMI, visual fields examination shows possible left hemianopsia, left facial weakness and a lower motor neuron fashion with weakness of the upper face as well as the lower face, inability to close the eyelid completely and eye rolling up and trying to close the eyes.   hearing intact, tongue/uvula/soft palate midline, normal sternocleidomastoid and trapezius muscle strength. No evidence of tongue atrophy or fibrillations Motor: 5/5 all over Tone: is normal and bulk is normal Sensation- Intact to light touch bilaterally Coordination: FTN intact bilaterally, no ataxia in BLE. Gait- deferred  Medications  Current Facility-Administered Medications:  .  0.9 %  sodium chloride infusion, 250 mL, Intravenous, PRN, Cristal Ford, DO .  acetaminophen (TYLENOL) tablet 650 mg, 650 mg, Oral, Q6H PRN **OR** acetaminophen (TYLENOL) suppository 650 mg,  650 mg, Rectal, Q6H PRN, Cristal Ford, DO .  cholecalciferol (VITAMIN D) tablet 1,000 Units, 1,000 Units, Oral, Daily, Cristal Ford, DO, 1,000 Units at 02/05/17 0847 .  doxazosin (CARDURA) tablet 8 mg, 8 mg, Oral, BID, Mikhail, [place], DO, 8 mg at 02/05/17 0844 .  mycophenolate (MYFORTIC) EC tablet 360 mg, 360 mg, Oral, BID, Cristal Ford, DO, 360 mg at 02/05/17 0847 .  [START ON 02/06/2017] pneumococcal 23 valent vaccine (PNU-IMMUNE) injection 0.5 mL, 0.5 mL, Intramuscular, Tomorrow-1000, Mikhail, [place], DO .  predniSONE (DELTASONE) tablet 5 mg, 5 mg, Oral, Q breakfast, Mikhail, [place], DO, 5 mg at 02/05/17 0843 .  sodium chloride flush (NS) 0.9 % injection 3 mL, 3 mL, Intravenous, Q12H, Mikhail, [place], DO, 3 mL at 02/04/17 2228 .  sodium chloride flush (NS) 0.9 % injection 3 mL, 3 mL, Intravenous, PRN, Cristal Ford, DO .  tacrolimus (PROGRAF) capsule 1 mg, 1 mg, Oral, BID, Mikhail, [place], DO, 1 mg at 02/05/17 0846 .  Warfarin - Pharmacist Dosing Inpatient, , Does not apply, q1800, [place], Almeta Monas, RPH Labs CBC    Component Value Date/Time   WBC 5.2 02/05/2017 0401   RBC 3.52 (L) 02/05/2017 0401   HGB 10.0 (L) 02/05/2017 0401   HCT 32.1 (L) 02/05/2017 0401   PLT 112 (L) 02/05/2017 0401   MCV 91.2 02/05/2017 0401   MCH 28.4 02/05/2017 0401   MCHC 31.2 02/05/2017 0401   RDW 14.5 02/05/2017 0401   LYMPHSABS 1.3 02/04/2017 1507   MONOABS 0.4 02/04/2017 1507   EOSABS 0.1 02/04/2017 1507   BASOSABS 0.0 02/04/2017  1507    CMP     Component Value Date/Time   NA 140 02/05/2017 0401   K 4.4 02/05/2017 0401   CL 108 02/05/2017 0401   CO2 24 02/05/2017 0401   GLUCOSE 85 02/05/2017 0401   BUN 36 (H) 02/05/2017 0401   CREATININE 1.69 (H) 02/05/2017 0401   CREATININE 1.46 (H) 05/10/2013 1015   CALCIUM 9.2 02/05/2017 0401   PROT 6.4 (L) 02/04/2017 1507   ALBUMIN 3.7 02/04/2017 1507   AST 21 02/04/2017 1507   ALT 17 02/04/2017 1507   ALKPHOS 58 02/04/2017 1507    BILITOT 0.7 02/04/2017 1507   GFRNONAA 38 (L) 02/05/2017 0401   GFRAA 45 (L) 02/05/2017 0401    glycosylated hemoglobin  Lipid Panel  No results found for: CHOL, TRIG, HDL, CHOLHDL, VLDL, LDLCALC, LDLDIRECT   Imaging I have reviewed images in epic and the results pertinent to this consultation are:  MRI brain - per Dr Nori Riis read "IMPRESSION: 1. Heterogeneously enhancing mass lesion in the anterior inferior frontal lobes crosses midline. The tumor measures 5.3 by 3.6 x 4.0 cm. This lesion may be extra-axial. 2. Similar enhancing lesions with restricted diffusion are noted in the fourth ventricle and involving the right side of the inferior cerebellar vermis. 3. 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 Metastatic disease is considered less likely. The diffusion-weighted images do not show the typical level of restricted diffusion seen with lymphoma. Lumbar puncture may be useful for diagnosis given the likelihood of CSF tumor spread. MRI of the lumbar spine without and with contrast could also be utilized to evaluate for distal tumor spread. 4. Diffuse subcortical white matter changes bilaterally likely reflect the sequela of chronic microvascular ischemia."  Assessment: 71 year old man with past medical history as above with worsening left facial droop and dysarthria. 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 There is also enhancing lesion in the fourth ventricle. On exam, he has left 7th nerve palsy and a lower motor neuron type fashion which could probably be from the CSF spread.  Recommendations: Per NSGY needs high volume LP. He is anticoagulated as well has a mass. I would recommend coordinating the LP under IR guidance after coagulopathy is corrected. From a neurological  standpoint, I do not have any new recs. I would recommend still hold off on using steroids till a diagnosis is made because if this is a lymphoma, steroids can lead to rapid resolution and it could be missed when CSF sent for testing. I discussed the imaging findings in detail with the patient and his girlfriend at the bedside.  I answered all their questions. Also provided them with the name of neuron oncologist-Dr. Mickeal Skinner, who has been made aware of this patient. Neurology will sign off for now.   Please recall with questions.    Amie Portland, MD [facility] [phone] If 7pm to 7am, please call on call as listed on AMION.

## 2017-02-05 NOTE — Progress Notes (Signed)
PROGRESS NOTE    Jeffrey Perez  ONG:[id number] DOB: 1945/08/03 DOA: 02/04/2017 PCP: System, Pcp Not In   Chief Complaint  Patient presents with  . Facial Droop    Brief Narrative:  HPI on 02/04/2017 Jeffrey Perez is a 71 y.o. male with a medical history of renal transplant/end-stage renal disease, gout, hypertension, anemia, who presented to the emergency department with complaints of left facial droop and concern for stroke. Patient was recently admitted and discharged on 01/28/2017 for generalized weakness. Patient reports that since leaving the hospital last week, he started having some left-sided weakness of his face. Family friend present at bedside, reported that she noticed it was worse today. Patient also reported some difficulty walking and dizziness. Denies any recent illness, travel. Denies chest pain, shortness of breath, abdominal pain, nausea vomiting, diarrhea or constipation, changes in urinary or bowel habits.   Assessment & Plan   Left facial droop/Brain lesion -?Bells palsy -CT head: Abnormal appearance of both anterior inferior frontal lobes and anterior corpus callosum, resembling a tumor which has crossed midline rather than infarct.  -MRI with and without contrast: Heterogeneously enhancing mass lesion in the anterior inferior frontal lobes crosses midline, measures 5.3 x 3.6 x 4.0 cm -Neurology consulted and appreciated -Neurosurgery consulted and appreciated, recommended large volume lumbar puncture -Will consult interventional radiology  History of renal transplant/CKD -Transplant occurred 25 years ago, no longer on dialysis -Unknown baseline creatinine, currently 1.69 -Continue monitor BMP -Continue mycophenolate, prednisone, tacrolimus  Paroxysmal atrial fibrillation -Currently in sinus rhythm -CHADSVASC 2 (based on age and HTN) -Will hold coumadin, however INR currently supratherapeutic -INR currently 3.14 -Heparin ordered for when INR  <2  Chronic mesenteric anemia/thrombocytopenia -Hemoglobin and platelets appears to be at baseline -Continue monitor CBC  Essential hypertension -Continue Cardura  DVT Prophylaxis  Coumadin  Code Status: Full  Family Communication: None at bedside  Disposition Plan: Observation  Consultants Neurology Neurosurgery Interventional radiology  Procedures  None  Antibiotics   Anti-infectives (From admission, onward)   None      Subjective:   Jeffrey Perez seen and examined today.  Continues to have left facial droop. Denies pain, chest pain, shortness of breath, abdominal pain, nausea, vomiting, diarrhea, constipation.   Objective:   Vitals:   02/05/17 0643 02/05/17 0843 02/05/17 0936 02/05/17 1345  BP: 124/82 129/77 97/68 107/67  Pulse:   64 79  Resp:   16 16  Temp: (!) 97.3 F (36.3 C)   97.7 F (36.5 C)  TempSrc: Oral   Oral  SpO2: 97%  97% 98%  Weight:        Intake/Output Summary (Last 24 hours) at 02/05/2017 1438 Last data filed at 02/05/2017 1345 Gross per 24 hour  Intake 803 ml  Output 1000 ml  Net -197 ml   Filed Weights   02/04/17 1949  Weight: 65.1 kg (143 lb 9.6 oz)   Exam  General: Well developed, well nourished, NAD, appears stated age  38: NCAT, mucous membranes moist. Left facial droop  Cardiovascular: S1 S2 auscultated, +SEM, RRR  Respiratory: Clear to auscultation bilaterally with equal chest rise  Abdomen: Soft, nontender, nondistended, + bowel sounds  Extremities: warm dry without cyanosis clubbing or edema  Neuro: AAOx3, left hemianopsia, left sided facial weakness, otherwise nonfocal  Psych: Normal affect and demeanor with intact judgement and insight   Data Reviewed: I have personally reviewed following labs and imaging studies  CBC: Recent Labs  Lab 02/04/17 1507 02/04/17 1526 02/05/17 0401  WBC 5.3  --  5.2  NEUTROABS 3.5  --   --   HGB 10.8* 11.2* 10.0*  HCT 35.7* 33.0* 32.1*  MCV 92.5  --  91.2  PLT  125*  --  644*   Basic Metabolic Panel: Recent Labs  Lab 02/04/17 1507 02/04/17 1526 02/05/17 0401  NA 140 143 140  K 3.8 3.8 4.4  CL 109 109 108  CO2 23  --  24  GLUCOSE 124* 119* 85  BUN 41* 40* 36*  CREATININE 1.70* 1.80* 1.69*  CALCIUM 9.7  --  9.2   GFR: Estimated Creatinine Clearance: 35.1 mL/min (A) (by C-G formula based on SCr of 1.69 mg/dL (H)). Liver Function Tests: Recent Labs  Lab 02/04/17 1507  AST 21  ALT 17  ALKPHOS 58  BILITOT 0.7  PROT 6.4*  ALBUMIN 3.7   No results for input(s): LIPASE, AMYLASE in the last 168 hours. No results for input(s): AMMONIA in the last 168 hours. Coagulation Profile: Recent Labs  Lab 02/04/17 1507 02/05/17 0401  INR 3.23 3.14   Cardiac Enzymes: No results for input(s): CKTOTAL, CKMB, CKMBINDEX, TROPONINI in the last 168 hours. BNP (last 3 results) No results for input(s): PROBNP in the last 8760 hours. HbA1C: No results for input(s): HGBA1C in the last 72 hours. CBG: Recent Labs  Lab 02/04/17 1532  GLUCAP 115*   Lipid Profile: No results for input(s): CHOL, HDL, LDLCALC, TRIG, CHOLHDL, LDLDIRECT in the last 72 hours. Thyroid Function Tests: No results for input(s): TSH, T4TOTAL, FREET4, T3FREE, THYROIDAB in the last 72 hours. Anemia Panel: No results for input(s): VITAMINB12, FOLATE, FERRITIN, TIBC, IRON, RETICCTPCT in the last 72 hours. Urine analysis:    Component Value Date/Time   COLORURINE YELLOW 01/25/2017 1814   APPEARANCEUR CLEAR 01/25/2017 1814   LABSPEC 1.008 01/25/2017 1814   PHURINE 6.0 01/25/2017 1814   GLUCOSEU NEGATIVE 01/25/2017 1814   HGBUR NEGATIVE 01/25/2017 1814   BILIRUBINUR NEGATIVE 01/25/2017 1814   KETONESUR NEGATIVE 01/25/2017 1814   PROTEINUR NEGATIVE 01/25/2017 1814   UROBILINOGEN 0.2 05/10/2013 1015   NITRITE NEGATIVE 01/25/2017 1814   LEUKOCYTESUR NEGATIVE 01/25/2017 1814   Sepsis Labs: @LABRCNTIP (procalcitonin:4,lacticidven:4)  )No results found for this or any  previous visit (from the past 240 hour(s)).    Radiology Studies: Mr Jeri Cos And Wo Contrast  Result Date: 02/04/2017 CLINICAL DATA:  Focal neuro deficit for greater than 6 hours. Abnormal CT of the head. Left facial droop and abnormal gait. Unknown time of onset. EXAM: MRI HEAD WITHOUT AND WITH CONTRAST TECHNIQUE: Multiplanar, multiecho pulse sequences of the brain and surrounding structures were obtained without and with intravenous contrast. CONTRAST:  <See Chart> MULTIHANCE GADOBENATE DIMEGLUMINE 529 MG/ML IV SOLN COMPARISON:  CT head without contrast from the same day. FINDINGS: Brain: Enhancing mass lesion centered in the inferior right frontal lobe across seen midline. There is involvement of the anterior cingulate gyrus as well as the rostrum any anterior genu of the corpus callosum. Anterior cerebral arteries extend through the lesion. The is slightly hyperintense on T2 weighted images with extensive heterogeneity. There is diffuse enhancement. The lesion measures 5.3 x 4.0 x 3.6 cm. The lesion involves the rostrum any anterior genu of the corpus callosum. The lesion appears to be centered inferior to the ventricles but extends superiorly in the frontal lobes, right greater than left. Surrounding vasogenic edema is confirmed. While there is some suggestion of CSF cleft along the superior aspect of the tumor, the lesion is inseparable from the corpus callosum on the postcontrast  images. Mild restricted diffusion is present within the lesion. A 0.5 x 0.7 x 1.2 cm lesion is present along the posterior aspect brainstem and ventral surface of the fourth ventricle. There is involvement of the right inferior vermis extending posteriorly. No other discrete foci of enhancement are present. There is no leptomeningeal enhancement. Moderate bilateral subcortical T2 signal changes are present bilaterally. 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 Vascular: Flow is present in the major  intracranial arteries. Skull and upper cervical spine: The skullbase is within normal limits. The craniocervical junction is normal. There is fusion across the C3-4 disc space. Marked degenerative changes are present at C4-5 and C5-6. Marrow signal is otherwise normal for age. Sinuses/Orbits: Mild mucosal thickening is present in the inferior maxillary sinuses and scattered throughout the ethmoid air cells. No significant fluid levels are present. Mastoid air cells are clear. Bilateral lens replacements are present. The globes and orbits are otherwise within normal limits. IMPRESSION: 1. Heterogeneously enhancing mass lesion in the anterior inferior frontal lobes crosses midline. The tumor measures 5.3 by 3.6 x 4.0 cm. This lesion may be extra-axial. 2. Similar enhancing lesions with restricted diffusion are noted in the fourth ventricle and involving the right side of the inferior cerebellar vermis. 3. 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 Metastatic disease is considered less likely. The diffusion-weighted images do not show the typical level of restricted diffusion seen with lymphoma. Lumbar puncture may be useful for diagnosis given the likelihood of CSF tumor spread. MRI of the lumbar spine without and with contrast could also be utilized to evaluate for distal tumor spread. 4. Diffuse subcortical white matter changes bilaterally likely reflect the sequela of chronic microvascular ischemia. These results were called by telephone at the time of interpretation on 02/04/2017 at 7:16 pm to Dr. Rory Percy, who verbally acknowledged these results. Electronically Signed   By: San Morelle M.D.   On: 02/04/2017 19:17   Ct Head Code Stroke Wo Contrast  Result Date: 02/04/2017 CLINICAL DATA:  Code stroke. 71 year old male with left facial droop and abnormal gait. Unknown time of onset. EXAM: CT HEAD WITHOUT CONTRAST TECHNIQUE: Contiguous  axial images were obtained from the base of the skull through the vertex without intravenous contrast. COMPARISON:  None. FINDINGS: Brain: There is architectural distortion along the anterior corpus callosum, such that no normal genu or rostrum are identified. There is suggestion posterior mass effect on the right frontal horn on series 3, image 15. There is associated abnormal hypodensity in both anterior inferior frontal gyri which more resembles vasogenic edema (instead of cytotoxic edema) series 3, image 13. In the genu region there is abnormal cortical thickening (primarily in the right hemisphere) and mass like heterogeneity as seen on coronal image 24. No acute intracranial hemorrhage identified. No ventriculomegaly. No areas suggestive of cytotoxic edema identified. The body and splenium of the corpus callosum appear normal. No cortical encephalomalacia identified. Vascular: Calcified atherosclerosis at the skull base. No suspicious intracranial vascular hyperdensity. Skull: No acute osseous abnormality identified. Sinuses/Orbits: Clear. Other: Calcified scalp vasculature. No acute orbit or scalp soft tissue findings. ASPECTS [facility] Stroke Program Early CT Score) Total score (0-10 with 10 being normal): 10. IMPRESSION: 1. Abnormal appearance of both anterior inferior frontal lobes and the anterior corpus callosum, but more resembling a tumor which has crossed midline rather than infarct. Recommend Brain MRI without and with contrast when possible. 2. No acute intracranial  hemorrhage. 3. ASPECTS is 10. 4. The above was relayed via text pager to Dr. Jerelyn Charles on 02/04/2017 at 1541 hours. Electronically Signed   By: Genevie Ann M.D.   On: 02/04/2017 15:45     Scheduled Meds: . cholecalciferol  1,000 Units Oral Daily  . doxazosin  8 mg Oral BID  . mycophenolate  360 mg Oral BID  . [START ON 02/06/2017] pneumococcal 23 valent vaccine  0.5 mL Intramuscular Tomorrow-1000  . predniSONE  5 mg Oral Q breakfast  .  sodium chloride flush  3 mL Intravenous Q12H  . tacrolimus  1 mg Oral BID  . Warfarin - Pharmacist Dosing Inpatient   Does not apply q1800   Continuous Infusions: . sodium chloride       LOS: 0 days   Time Spent in minutes   30 minutes  Keonda Dow D.O. on 02/05/2017 at 2:38 PM  Between 7am to 7pm - Pager - [phone]  After 7pm go to [email or web address] - password [email or web address]  And look for the night coverage person covering for me after hours  [facility] Office  [phone]

## 2017-02-05 NOTE — Consult Note (Signed)
Chief Complaint   Chief Complaint  Patient presents with  . Facial Droop    HPI   HPI: Jeffrey Perez is a 71 y.o. male who presented to ER yesterday due to left facial droop. He was recently admitted and discharged on 01/28/17 for generalized weakness, but since leaving the hospital has been having left sided facial droop. He has also had some issues with gait instability, blurry vision (Left worse than right) and dizziness. Feels well otherwise. No history of cancer. Does have a history of ESRD s/p renal transplant. No memory issues, recent falls, seizures.  Patient Active Problem List   Diagnosis Date Noted  . Facial droop 02/04/2017  . Generalized weakness 01/27/2017  . Normocytic anemia 01/25/2017  . Thrombocytopenia ([place]) 01/25/2017  . Renal insufficiency 01/25/2017  . General weakness 01/25/2017  . Hypertension 06/06/2012  . History of renal transplant 06/06/2012  . Chronic anticoagulation 06/06/2012  . Paroxysmal atrial fibrillation ([place]) 05/30/2012  . Gout 05/30/2012    PMH: Past Medical History:  Diagnosis Date  . Acute kidney injury ([place]) 01/25/2017   Archie Endo 01/25/2017  . Anemia   . Arthritis    'all over my bones" (01/26/2017)  . Coronary artery disease   . ESRD (end stage renal disease) on dialysis [facility]) 1992-1994   "stopped when I got the transplant"  . Gout   . History of blood transfusion    "very many times" (01/26/2017)  . Hypertension     PSH: Past Surgical History:  Procedure Laterality Date  . APPENDECTOMY  1970s  . AV FISTULA PLACEMENT Left 1992   "not working anymore" (01/26/2017)  . CATARACT EXTRACTION W/ INTRAOCULAR LENS  IMPLANT, BILATERAL Bilateral   . COLONOSCOPY  ~ 2016  . CORONARY ANGIOPLASTY WITH STENT PLACEMENT  ~ 2003  . INGUINAL HERNIA REPAIR Left 2000s  . KIDNEY TRANSPLANT Left 1994    Medications Prior to Admission  Medication Sig Dispense Refill Last Dose  . cholecalciferol (VITAMIN D) 1000 units tablet Take 1,000 Units  by mouth daily.   02/04/2017 at Unknown time  . Cyanocobalamin (VITAMIN B-12 PO) Take by mouth daily.   02/04/2017 at Unknown time  . doxazosin (CARDURA) 8 MG tablet Take 8 mg by mouth 2 (two) times daily.   02/04/2017 at Unknown time  . Mycophenolate Sodium (MYFORTIC PO) Take 360 mg by mouth 2 (two) times daily.    02/04/2017 at Unknown time  . predniSONE (DELTASONE) 5 MG tablet Take 10 mg (2 tablets) po Daily x 3 more days and then go back to 5 mg po Daily 33 tablet 0 02/04/2017 at Unknown time  . Pyridoxine HCl (VITAMIN B-6 PO) Take by mouth daily.   02/04/2017 at Unknown time  . tacrolimus (PROGRAF) 1 MG capsule Take 1 mg by mouth 2 (two) times daily.    02/04/2017 at Unknown time  . warfarin (COUMADIN) 1 MG tablet Take 1 tablet (1 mg total) by mouth daily. (Patient taking differently: Take 1 mg by mouth at bedtime. ) 30 tablet 11 02/03/2017 at Unknown time  . warfarin (COUMADIN) 5 MG tablet Take 5 mg by mouth at bedtime.    02/03/2017 at Unknown time    SH: Social History   Tobacco Use  . Smoking status: Former Smoker    Years: 5.00    Types: Cigarettes    Last attempt to quit: 1965    Years since quitting: 53.8  . Smokeless tobacco: Never Used  Substance Use Topics  . Alcohol use: No  .  Drug use: No    MEDS: Prior to Admission medications   Medication Sig Start Date End Date Taking? Authorizing Provider  cholecalciferol (VITAMIN D) 1000 units tablet Take 1,000 Units by mouth daily.   Yes [provider]  Cyanocobalamin (VITAMIN B-12 PO) Take by mouth daily.   Yes [provider]  doxazosin (CARDURA) 8 MG tablet Take 8 mg by mouth 2 (two) times daily.   Yes [provider]  Mycophenolate Sodium (MYFORTIC PO) Take 360 mg by mouth 2 (two) times daily.    Yes [provider]  predniSONE (DELTASONE) 5 MG tablet Take 10 mg (2 tablets) po Daily x 3 more days and then go back to 5 mg po Daily 01/28/17  Yes Sheikh, Omair Latif, DO  Pyridoxine HCl (VITAMIN B-6  PO) Take by mouth daily.   Yes [provider]  tacrolimus (PROGRAF) 1 MG capsule Take 1 mg by mouth 2 (two) times daily.    Yes [provider]  warfarin (COUMADIN) 1 MG tablet Take 1 tablet (1 mg total) by mouth daily. Patient taking differently: Take 1 mg by mouth at bedtime.  01/28/17 01/28/18 Yes Sheikh, Omair Latif, DO  warfarin (COUMADIN) 5 MG tablet Take 5 mg by mouth at bedtime.    Yes [provider]    ALLERGY: No Known Allergies  Social History   Tobacco Use  . Smoking status: Former Smoker    Years: 5.00    Types: Cigarettes    Last attempt to quit: 1965    Years since quitting: 53.8  . Smokeless tobacco: Never Used  Substance Use Topics  . Alcohol use: No     No family history on file.   ROS   Review of Systems  Constitutional: Negative for chills and fever.  HENT: Negative.   Eyes: Positive for blurred vision and double vision. Negative for photophobia.  Respiratory: Negative.   Cardiovascular: Negative.   Gastrointestinal: Negative for abdominal pain, constipation, diarrhea, nausea and vomiting.  Musculoskeletal: Negative.   Neurological: Positive for dizziness, focal weakness (facial droop L) and weakness. Negative for tingling, tremors, sensory change, speech change, seizures, loss of consciousness and headaches.    Exam   Vitals:   02/05/17 0304 02/05/17 0643  BP: 127/81 124/82  Pulse: 65   Resp: 16   Temp:  (!) 97.3 F (36.3 C)  SpO2: 98% 97%   General appearance: WDWN, NAD, resting comfortably Eyes: PERRL Cardiovascular: Regular rate and rhythm without murmurs, rubs, gallops. No edema or variciosities. Distal pulses normal. Pulmonary: Clear to auscultation Musculoskeletal:     Muscle tone upper extremities: Normal    Muscle tone lower extremities: Normal    Motor exam: Upper Extremities Deltoid Bicep Tricep Grip  Right 5/5 5/5 5/5 5/5  Left 5/5 5/5 5/5 5/5   Lower Extremity IP Quad PF DF EHL  Right 5/5 5/5  5/5 5/5 5/5  Left 5/5 5/5 5/5 5/5 5/5   Awake, alert, oriented Memory and concentration grossly intact Obvious left facial droop  Results - Imaging/Labs   Results for orders placed or performed during the hospital encounter of 02/04/17 (from the past 48 hour(s))  Protime-INR     Status: Abnormal   Collection Time: 02/04/17  3:07 PM  Result Value Ref Range   Prothrombin Time 32.7 (H) 11.4 - 15.2 seconds   INR 3.23   APTT     Status: Abnormal   Collection Time: 02/04/17  3:07 PM  Result Value Ref Range  aPTT 40 (H) 24 - 36 seconds    Comment:        IF BASELINE aPTT IS ELEVATED, SUGGEST PATIENT RISK ASSESSMENT BE USED TO DETERMINE APPROPRIATE ANTICOAGULANT THERAPY.   CBC     Status: Abnormal   Collection Time: 02/04/17  3:07 PM  Result Value Ref Range   WBC 5.3 4.0 - 10.5 K/uL   RBC 3.86 (L) 4.22 - 5.81 MIL/uL   Hemoglobin 10.8 (L) 13.0 - 17.0 g/dL   HCT 35.7 (L) 39.0 - 52.0 %   MCV 92.5 78.0 - 100.0 fL   MCH 28.0 26.0 - 34.0 pg   MCHC 30.3 30.0 - 36.0 g/dL   RDW 14.9 11.5 - 15.5 %   Platelets 125 (L) 150 - 400 K/uL  Differential     Status: None   Collection Time: 02/04/17  3:07 PM  Result Value Ref Range   Neutrophils Relative % 65 %   Neutro Abs 3.5 1.7 - 7.7 K/uL   Lymphocytes Relative 25 %   Lymphs Abs 1.3 0.7 - 4.0 K/uL   Monocytes Relative 7 %   Monocytes Absolute 0.4 0.1 - 1.0 K/uL   Eosinophils Relative 2 %   Eosinophils Absolute 0.1 0.0 - 0.7 K/uL   Basophils Relative 1 %   Basophils Absolute 0.0 0.0 - 0.1 K/uL  Comprehensive metabolic panel     Status: Abnormal   Collection Time: 02/04/17  3:07 PM  Result Value Ref Range   Sodium 140 135 - 145 mmol/L   Potassium 3.8 3.5 - 5.1 mmol/L   Chloride 109 101 - 111 mmol/L   CO2 23 22 - 32 mmol/L   Glucose, Bld 124 (H) 65 - 99 mg/dL   BUN 41 (H) 6 - 20 mg/dL   Creatinine, Ser 1.70 (H) 0.61 - 1.24 mg/dL   Calcium 9.7 8.9 - 10.3 mg/dL   Total Protein 6.4 (L) 6.5 - 8.1 g/dL   Albumin 3.7 3.5 - 5.0 g/dL    AST 21 15 - 41 U/L   ALT 17 17 - 63 U/L   Alkaline Phosphatase 58 38 - 126 U/L   Total Bilirubin 0.7 0.3 - 1.2 mg/dL   GFR calc non Af Amer 38 (L) >60 mL/min   GFR calc Af Amer 44 (L) >60 mL/min    Comment: (NOTE) The eGFR has been calculated using the CKD EPI equation. This calculation has not been validated in all clinical situations. eGFR's persistently <60 mL/min signify possible Chronic Kidney Disease.    Anion gap 8 5 - 15  I-Stat Chem 8, ED     Status: Abnormal   Collection Time: 02/04/17  3:26 PM  Result Value Ref Range   Sodium 143 135 - 145 mmol/L   Potassium 3.8 3.5 - 5.1 mmol/L   Chloride 109 101 - 111 mmol/L   BUN 40 (H) 6 - 20 mg/dL   Creatinine, Ser 1.80 (H) 0.61 - 1.24 mg/dL   Glucose, Bld 119 (H) 65 - 99 mg/dL   Calcium, Ion 1.29 1.15 - 1.40 mmol/L   TCO2 25 22 - 32 mmol/L   Hemoglobin 11.2 (L) 13.0 - 17.0 g/dL   HCT 33.0 (L) 39.0 - 52.0 %  CBG monitoring, ED     Status: Abnormal   Collection Time: 02/04/17  3:32 PM  Result Value Ref Range   Glucose-Capillary 115 (H) 65 - 99 mg/dL   Comment 1 Notify RN    Comment 2 Document in Chart   I-stat troponin,  ED     Status: None   Collection Time: 02/04/17  3:35 PM  Result Value Ref Range   Troponin i, poc 0.02 0.00 - 0.08 ng/mL   Comment 3            Comment: Due to the release kinetics of cTnI, a negative result within the first hours of the onset of symptoms does not rule out myocardial infarction with certainty. If myocardial infarction is still suspected, repeat the test at appropriate intervals.   Protime-INR     Status: Abnormal   Collection Time: 02/05/17  4:01 AM  Result Value Ref Range   Prothrombin Time 32.0 (H) 11.4 - 15.2 seconds   INR 3.14   CBC     Status: Abnormal   Collection Time: 02/05/17  4:01 AM  Result Value Ref Range   WBC 5.2 4.0 - 10.5 K/uL   RBC 3.52 (L) 4.22 - 5.81 MIL/uL   Hemoglobin 10.0 (L) 13.0 - 17.0 g/dL   HCT 32.1 (L) 39.0 - 52.0 %   MCV 91.2 78.0 - 100.0 fL   MCH  28.4 26.0 - 34.0 pg   MCHC 31.2 30.0 - 36.0 g/dL   RDW 14.5 11.5 - 15.5 %   Platelets 112 (L) 150 - 400 K/uL    Comment: PLATELET COUNT CONFIRMED BY SMEAR  Basic metabolic panel     Status: Abnormal   Collection Time: 02/05/17  4:01 AM  Result Value Ref Range   Sodium 140 135 - 145 mmol/L   Potassium 4.4 3.5 - 5.1 mmol/L   Chloride 108 101 - 111 mmol/L   CO2 24 22 - 32 mmol/L   Glucose, Bld 85 65 - 99 mg/dL   BUN 36 (H) 6 - 20 mg/dL   Creatinine, Ser 1.69 (H) 0.61 - 1.24 mg/dL   Calcium 9.2 8.9 - 10.3 mg/dL   GFR calc non Af Amer 38 (L) >60 mL/min   GFR calc Af Amer 45 (L) >60 mL/min    Comment: (NOTE) The eGFR has been calculated using the CKD EPI equation. This calculation has not been validated in all clinical situations. eGFR's persistently <60 mL/min signify possible Chronic Kidney Disease.    Anion gap 8 5 - 15    Mr Brain W And Wo Contrast  Result Date: 02/04/2017 CLINICAL DATA:  Focal neuro deficit for greater than 6 hours. Abnormal CT of the head. Left facial droop and abnormal gait. Unknown time of onset. EXAM: MRI HEAD WITHOUT AND WITH CONTRAST TECHNIQUE: Multiplanar, multiecho pulse sequences of the brain and surrounding structures were obtained without and with intravenous contrast. CONTRAST:  <See Chart> MULTIHANCE GADOBENATE DIMEGLUMINE 529 MG/ML IV SOLN COMPARISON:  CT head without contrast from the same day. FINDINGS: Brain: Enhancing mass lesion centered in the inferior right frontal lobe across seen midline. There is involvement of the anterior cingulate gyrus as well as the rostrum any anterior genu of the corpus callosum. Anterior cerebral arteries extend through the lesion. The is slightly hyperintense on T2 weighted images with extensive heterogeneity. There is diffuse enhancement. The lesion measures 5.3 x 4.0 x 3.6 cm. The lesion involves the rostrum any anterior genu of the corpus callosum. The lesion appears to be centered inferior to the ventricles but extends  superiorly in the frontal lobes, right greater than left. Surrounding vasogenic edema is confirmed. While there is some suggestion of CSF cleft along the superior aspect of the tumor, the lesion is inseparable from the corpus callosum on the postcontrast  images. Mild restricted diffusion is present within the lesion. A 0.5 x 0.7 x 1.2 cm lesion is present along the posterior aspect brainstem and ventral surface of the fourth ventricle. There is involvement of the right inferior vermis extending posteriorly. No other discrete foci of enhancement are present. There is no leptomeningeal enhancement. Moderate bilateral subcortical T2 signal changes are present bilaterally. 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 Vascular: Flow is present in the major intracranial arteries. Skull and upper cervical spine: The skullbase is within normal limits. The craniocervical junction is normal. There is fusion across the C3-4 disc space. Marked degenerative changes are present at C4-5 and C5-6. Marrow signal is otherwise normal for age. Sinuses/Orbits: Mild mucosal thickening is present in the inferior maxillary sinuses and scattered throughout the ethmoid air cells. No significant fluid levels are present. Mastoid air cells are clear. Bilateral lens replacements are present. The globes and orbits are otherwise within normal limits. IMPRESSION: 1. Heterogeneously enhancing mass lesion in the anterior inferior frontal lobes crosses midline. The tumor measures 5.3 by 3.6 x 4.0 cm. This lesion may be extra-axial. 2. Similar enhancing lesions with restricted diffusion are noted in the fourth ventricle and involving the right side of the inferior cerebellar vermis. 3. 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 Metastatic disease is considered less likely. The diffusion-weighted images do not show the typical level of restricted diffusion seen with  lymphoma. Lumbar puncture may be useful for diagnosis given the likelihood of CSF tumor spread. MRI of the lumbar spine without and with contrast could also be utilized to evaluate for distal tumor spread. 4. Diffuse subcortical white matter changes bilaterally likely reflect the sequela of chronic microvascular ischemia. These results were called by telephone at the time of interpretation on 02/04/2017 at 7:16 pm to Dr. Rory Percy, who verbally acknowledged these results. Electronically Signed   By: San Morelle M.D.   On: 02/04/2017 19:17   Ct Head Code Stroke Wo Contrast  Result Date: 02/04/2017 CLINICAL DATA:  Code stroke. 71 year old male with left facial droop and abnormal gait. Unknown time of onset. EXAM: CT HEAD WITHOUT CONTRAST TECHNIQUE: Contiguous axial images were obtained from the base of the skull through the vertex without intravenous contrast. COMPARISON:  None. FINDINGS: Brain: There is architectural distortion along the anterior corpus callosum, such that no normal genu or rostrum are identified. There is suggestion posterior mass effect on the right frontal horn on series 3, image 15. There is associated abnormal hypodensity in both anterior inferior frontal gyri which more resembles vasogenic edema (instead of cytotoxic edema) series 3, image 13. In the genu region there is abnormal cortical thickening (primarily in the right hemisphere) and mass like heterogeneity as seen on coronal image 24. No acute intracranial hemorrhage identified. No ventriculomegaly. No areas suggestive of cytotoxic edema identified. The body and splenium of the corpus callosum appear normal. No cortical encephalomalacia identified. Vascular: Calcified atherosclerosis at the skull base. No suspicious intracranial vascular hyperdensity. Skull: No acute osseous abnormality identified. Sinuses/Orbits: Clear. Other: Calcified scalp vasculature. No acute orbit or scalp soft tissue findings. ASPECTS [facility] Stroke  Program Early CT Score) Total score (0-10 with 10 being normal): 10. IMPRESSION: 1. Abnormal appearance of both anterior inferior frontal lobes and the anterior corpus callosum, but more resembling a tumor which has crossed midline rather than infarct. Recommend Brain MRI without and with contrast when possible. 2. No acute intracranial  hemorrhage. 3. ASPECTS is 10. 4. The above was relayed via text pager to Dr. Jerelyn Charles on 02/04/2017 at 1541 hours. Electronically Signed   By: Genevie Ann M.D.   On: 02/04/2017 15:45    Impression/Plan   71 y.o. male with extra-axial enhancing mass in the anterior inferior frontal lobe. Similar enhancing lesions seen in fourth ventricle and involved right side of inferior cerebellar vermis. Obvious cranial nerve deficits. Case reviewed with BJD who has also reviewed the imaging.

## 2017-02-05 NOTE — Progress Notes (Addendum)
ADDENDUM- 2nd shift follow-up  To start heparin when INR <2. INR currently 3.14.  Will discontinue warfarin per pharmacy order- please reconsult when appropriate to restart. Will continue checking daily INR (at minimum) for appropriateness to start heparin.  Shawntel Farnworth D. Lillymae Duet, PharmD, BCPS Clinical Pharmacist  [phone] 02/05/2017 5:11 PM   ANTICOAGULATION CONSULT NOTE - Initial Consult  Pharmacy Consult for warfarin/heparin Indication: atrial fibrillation  No Known Allergies  Patient Measurements: Weight: 143 lb 9.6 oz (65.1 kg)   Vital Signs: Temp: 97.3 F (36.3 C) (11/04 0643) Temp Source: Oral (11/04 0643) BP: 124/82 (11/04 0643) Pulse Rate: 65 (11/04 0304)  Labs: Recent Labs    02/04/17 1507 02/04/17 1526 02/05/17 0401  HGB 10.8* 11.2* 10.0*  HCT 35.7* 33.0* 32.1*  PLT 125*  --  112*  APTT 40*  --   --   LABPROT 32.7*  --  32.0*  INR 3.23  --  3.14  CREATININE 1.70* 1.80* 1.69*    Estimated Creatinine Clearance: 35.1 mL/min (A) (by C-G formula based on SCr of 1.69 mg/dL (H)).    Assessment: 55 YOM seen in ED with facial drop- has been worsening over last several days to 1 week. Recently admitted for generalized weakness.  On warfarin for AFib. Home dose was 5mg  daily. INR today elevated at 3.14. Hgb 10, plts 112 (stable). Warfarin held 11/3.   Patient seen by neuro for stroke like symptoms- code stroke cancelled. To get MRI for brain mass seen on CT.  Goal of Therapy:  INR 2-3 Monitor platelets by anticoagulation protocol: Yes   Plan:  Continue to hold warfarin with elevated INR Start heparin once INR < 2 Daily INR and CBC Follow s/s bleeding Follow neuro workup  Angus Seller, PharmD Pharmacy Resident [phone] 02/05/2017 8:31 AM

## 2017-02-06 ENCOUNTER — Other Ambulatory Visit: Payer: Self-pay

## 2017-02-06 ENCOUNTER — Encounter (HOSPITAL_COMMUNITY): Payer: Self-pay | Admitting: *Deleted

## 2017-02-06 ENCOUNTER — Other Ambulatory Visit: Payer: Self-pay | Admitting: Neurological Surgery

## 2017-02-06 ENCOUNTER — Ambulatory Visit: Payer: Medicare Other | Admitting: Family Medicine

## 2017-02-06 DIAGNOSIS — I480 Paroxysmal atrial fibrillation: Secondary | ICD-10-CM | POA: Diagnosis present

## 2017-02-06 DIAGNOSIS — H02402 Unspecified ptosis of left eyelid: Secondary | ICD-10-CM | POA: Diagnosis not present

## 2017-02-06 DIAGNOSIS — Z66 Do not resuscitate: Secondary | ICD-10-CM | POA: Diagnosis present

## 2017-02-06 DIAGNOSIS — R29810 Facial weakness: Secondary | ICD-10-CM | POA: Diagnosis present

## 2017-02-06 DIAGNOSIS — Z955 Presence of coronary angioplasty implant and graft: Secondary | ICD-10-CM | POA: Diagnosis not present

## 2017-02-06 DIAGNOSIS — H04122 Dry eye syndrome of left lacrimal gland: Secondary | ICD-10-CM

## 2017-02-06 DIAGNOSIS — I4891 Unspecified atrial fibrillation: Secondary | ICD-10-CM | POA: Diagnosis not present

## 2017-02-06 DIAGNOSIS — A5217 General paresis: Secondary | ICD-10-CM | POA: Diagnosis not present

## 2017-02-06 DIAGNOSIS — C719 Malignant neoplasm of brain, unspecified: Secondary | ICD-10-CM | POA: Diagnosis not present

## 2017-02-06 DIAGNOSIS — Z515 Encounter for palliative care: Secondary | ICD-10-CM | POA: Diagnosis not present

## 2017-02-06 DIAGNOSIS — Z79899 Other long term (current) drug therapy: Secondary | ICD-10-CM | POA: Diagnosis not present

## 2017-02-06 DIAGNOSIS — I2510 Atherosclerotic heart disease of native coronary artery without angina pectoris: Secondary | ICD-10-CM | POA: Diagnosis present

## 2017-02-06 DIAGNOSIS — R471 Dysarthria and anarthria: Secondary | ICD-10-CM | POA: Diagnosis present

## 2017-02-06 DIAGNOSIS — I10 Essential (primary) hypertension: Secondary | ICD-10-CM | POA: Diagnosis present

## 2017-02-06 DIAGNOSIS — D696 Thrombocytopenia, unspecified: Secondary | ICD-10-CM | POA: Diagnosis present

## 2017-02-06 DIAGNOSIS — R112 Nausea with vomiting, unspecified: Secondary | ICD-10-CM | POA: Diagnosis not present

## 2017-02-06 DIAGNOSIS — I959 Hypotension, unspecified: Secondary | ICD-10-CM | POA: Diagnosis present

## 2017-02-06 DIAGNOSIS — Z940 Kidney transplant status: Secondary | ICD-10-CM

## 2017-02-06 DIAGNOSIS — K5900 Constipation, unspecified: Secondary | ICD-10-CM | POA: Diagnosis present

## 2017-02-06 DIAGNOSIS — Z7952 Long term (current) use of systemic steroids: Secondary | ICD-10-CM | POA: Diagnosis not present

## 2017-02-06 DIAGNOSIS — G936 Cerebral edema: Secondary | ICD-10-CM | POA: Diagnosis present

## 2017-02-06 DIAGNOSIS — G939 Disorder of brain, unspecified: Secondary | ICD-10-CM | POA: Diagnosis not present

## 2017-02-06 DIAGNOSIS — D649 Anemia, unspecified: Secondary | ICD-10-CM | POA: Diagnosis not present

## 2017-02-06 DIAGNOSIS — I4510 Unspecified right bundle-branch block: Secondary | ICD-10-CM | POA: Diagnosis present

## 2017-02-06 DIAGNOSIS — D496 Neoplasm of unspecified behavior of brain: Secondary | ICD-10-CM | POA: Diagnosis not present

## 2017-02-06 DIAGNOSIS — N189 Chronic kidney disease, unspecified: Secondary | ICD-10-CM | POA: Diagnosis not present

## 2017-02-06 DIAGNOSIS — N179 Acute kidney failure, unspecified: Secondary | ICD-10-CM | POA: Diagnosis present

## 2017-02-06 DIAGNOSIS — C711 Malignant neoplasm of frontal lobe: Secondary | ICD-10-CM | POA: Diagnosis present

## 2017-02-06 DIAGNOSIS — Z7901 Long term (current) use of anticoagulants: Secondary | ICD-10-CM | POA: Diagnosis not present

## 2017-02-06 LAB — LIPID PANEL
Cholesterol: 196 mg/dL (ref 0–200)
HDL: 20 mg/dL — ABNORMAL LOW (ref >40)
LDL CALC: 139 mg/dL — AB (ref 0–99)
TRIGLYCERIDES: 185 mg/dL — AB (ref <150)
Total CHOL/HDL Ratio: 9.8 RATIO
VLDL: 37 mg/dL (ref 0–40)

## 2017-02-06 LAB — BASIC METABOLIC PANEL
ANION GAP: 8 (ref 5–15)
BUN: 35 mg/dL — ABNORMAL HIGH (ref 6–20)
CO2: 24 mmol/L (ref 22–32)
Calcium: 8.8 mg/dL — ABNORMAL LOW (ref 8.9–10.3)
Chloride: 106 mmol/L (ref 101–111)
Creatinine, Ser: 1.7 mg/dL — ABNORMAL HIGH (ref 0.61–1.24)
GFR calc Af Amer: 44 mL/min — ABNORMAL LOW (ref >60)
GFR, EST NON AFRICAN AMERICAN: 38 mL/min — AB (ref >60)
GLUCOSE: 89 mg/dL (ref 65–99)
POTASSIUM: 4.1 mmol/L (ref 3.5–5.1)
Sodium: 138 mmol/L (ref 135–145)

## 2017-02-06 LAB — PROTIME-INR
INR: 2.29
PROTHROMBIN TIME: 25 s — AB (ref 11.4–15.2)

## 2017-02-06 LAB — CBC
HEMATOCRIT: 32.6 % — AB (ref 39.0–52.0)
Hemoglobin: 9.9 g/dL — ABNORMAL LOW (ref 13.0–17.0)
MCH: 27.7 pg (ref 26.0–34.0)
MCHC: 30.4 g/dL (ref 30.0–36.0)
MCV: 91.3 fL (ref 78.0–100.0)
Platelets: 113 10*3/uL — ABNORMAL LOW (ref 150–400)
RBC: 3.57 MIL/uL — ABNORMAL LOW (ref 4.22–5.81)
RDW: 14.3 % (ref 11.5–15.5)
WBC: 4.9 10*3/uL (ref 4.0–10.5)

## 2017-02-06 MED ORDER — ONDANSETRON HCL 4 MG/2ML IJ SOLN
4.0000 mg | Freq: Once | INTRAMUSCULAR | Status: AC
  Administered 2017-02-06: 4 mg via INTRAVENOUS
  Filled 2017-02-06: qty 2

## 2017-02-06 MED ORDER — NAPHAZOLINE-GLYCERIN 0.012-0.2 % OP SOLN
1.0000 [drp] | Freq: Four times a day (QID) | OPHTHALMIC | Status: DC | PRN
  Administered 2017-02-07: 2 [drp] via OPHTHALMIC
  Administered 2017-02-07 – 2017-02-16 (×3): 1 [drp] via OPHTHALMIC
  Filled 2017-02-06 (×2): qty 15

## 2017-02-06 NOTE — Progress Notes (Signed)
Brain tumor conference recommended stereotactic biopsy; no need for LP now Patient is awake, alert, oriented Follows commands throughout Will plan stereotactic biopsy later this week, probably Thursday

## 2017-02-06 NOTE — Progress Notes (Signed)
ANTICOAGULATION CONSULT NOTE  Pharmacy Consult for warfarin Indication: atrial fibrillation  No Known Allergies  Patient Measurements: Weight: 143 lb 9.6 oz (65.1 kg)   Vital Signs: Temp: 98.6 F (37 C) (11/05 0704) Temp Source: Oral (11/05 0704) BP: 126/64 (11/05 0704) Pulse Rate: 89 (11/05 0704)  Labs: Recent Labs    02/04/17 1507 02/04/17 1526 02/05/17 0401 02/06/17 0250  HGB 10.8* 11.2* 10.0* 9.9*  HCT 35.7* 33.0* 32.1* 32.6*  PLT 125*  --  112* 113*  APTT 40*  --   --   --   LABPROT 32.7*  --  32.0* 25.0*  INR 3.23  --  3.14 2.29  CREATININE 1.70* 1.80* 1.69* 1.70*      Assessment: 71 year old male seen in ED with facial drop- has been worsening over last several days to 1 week. Stroke ruled out. Imaging shows mass in frontal lobe and lesions in 4th ventricle.  INR 2.2 today. Planning to start heparin when INR falls less than 2. Planning for patient to receieve large volume LP.  On warfarin for AFib. Home dose was 5mg  daily.  Hgb 10.8, plts 125.   Goal of Therapy:  INR 2-3 Monitor platelets by anticoagulation protocol: Yes   Plan:  -Hold heparin until INR < 2 -Daily INR     Jeffrey Perez, PharmD, BCPS Clinical Pharmacist 02/06/2017 9:15 AM

## 2017-02-06 NOTE — Care Management Note (Signed)
Case Management Note  Patient Details  Name: Jeffrey Perez MRN: [id number] Date of Birth: 02-04-1946  Subjective/Objective:     Pt admitted with brain mass. He is from home.                Action/Plan: No PT/OT ordered. MD please place order for PT/OT if needed. CM following for d/c needs, physician orders.   Expected Discharge Date:  02/14/17               Expected Discharge Plan:  Home/Self Care  In-House Referral:     Discharge planning Services     Post Acute Care Choice:    Choice offered to:     DME Arranged:    DME Agency:     HH Arranged:    HH Agency:     Status of Service:  In process, will continue to follow  If discussed at Long Length of Stay Meetings, dates discussed:    Additional Comments:  Pollie Friar, RN 02/06/2017, 10:40 AM

## 2017-02-06 NOTE — Progress Notes (Signed)
PROGRESS NOTE    Jeffrey Perez  KDX:[id number] DOB: May 19, 1945 DOA: 02/04/2017 PCP: System, Pcp Not In   Chief Complaint  Patient presents with  . Facial Droop    Brief Narrative:  HPI on 02/04/2017 Jeffrey Perez is a 71 y.o. male with a medical history of renal transplant/end-stage renal disease, gout, hypertension, anemia, who presented to the emergency department with complaints of left facial droop and concern for stroke. Patient was recently admitted and discharged on 01/28/2017 for generalized weakness. Patient reports that since leaving the hospital last week, he started having some left-sided weakness of his face. Family friend present at bedside, reported that she noticed it was worse today. Patient also reported some difficulty walking and dizziness. Denies any recent illness, travel. Denies chest pain, shortness of breath, abdominal pain, nausea vomiting, diarrhea or constipation, changes in urinary or bowel habits.   Assessment & Plan   Left facial droop/Brain lesion -?Bells palsy -CT head: Abnormal appearance of both anterior inferior frontal lobes and anterior corpus callosum, resembling a tumor which has crossed midline rather than infarct.  -MRI with and without contrast: Heterogeneously enhancing mass lesion in the anterior inferior frontal lobes crosses midline, measures 5.3 x 3.6 x 4.0 cm -Neurology consulted and appreciated -Neurosurgery consulted and appreciated, recommended large volume lumbar puncture with cytology and flow cytometry, however had tumor board today and now recommending sterotactic biopsy which will probably occur on 08/09/2016  -Interventional radiology consulted and appreciated, however no longer needed lumbar puncture -will order patient eye drops as he complains that his left eye is burning (as he cannot blink)  History of renal transplant/CKD -Transplant occurred 25 years ago, no longer on dialysis -Unknown baseline creatinine, currently  1.70 -Continue monitor BMP -Continue mycophenolate, prednisone, tacrolimus  Paroxysmal atrial fibrillation -Currently in sinus rhythm -CHADSVASC 2 (based on age and HTN) -Will hold coumadin, however INR currently supratherapeutic -INR currently 2.29 -Heparin ordered for when INR <2 -Discussed reversal with neurology, did not recommend as this could make patient more thrombotic given this mass, allow for INR to trend downward  Chronic mesenteric anemia/thrombocytopenia -Hemoglobin and platelets appears to be at baseline -Continue monitor CBC  Essential hypertension -Continue Cardura  DVT Prophylaxis  Coumadin  Code Status: Full  Family Communication: None at bedside  Disposition Plan: Observation. Pending biopsy later this week  Consultants Neurology Neurosurgery Interventional radiology  Procedures  None  Antibiotics   Anti-infectives (From admission, onward)   None      Subjective:   Jeffrey Perez seen and examined today.  Continues to have left facial droop and complains of left eye dryness/burning. Denies chest pain, shortness of breath, abdominal pain, nausea, vomiting, diarrhea, constipation.   Objective:   Vitals:   02/05/17 2352 02/06/17 0659 02/06/17 0704 02/06/17 0945  BP: 133/79 117/67 126/64 (!) 100/54  Pulse: 85 72 89 77  Resp: 16 18 18 18   Temp: 98.2 F (36.8 C)  98.6 F (37 C) 98.6 F (37 C)  TempSrc: Oral  Oral Oral  SpO2: 95% 96% 94% 98%  Weight:        Intake/Output Summary (Last 24 hours) at 02/06/2017 1037 Last data filed at 02/06/2017 0545 Gross per 24 hour  Intake 540 ml  Output 450 ml  Net 90 ml   Filed Weights   02/04/17 1949  Weight: 65.1 kg (143 lb 9.6 oz)   Exam  General: Well developed, well nourished, NAD, appears stated age  60: NCAT, mucous membranes moist. Left facial droop  Cardiovascular:  S1 S2 auscultated, RRR, +SEM  Respiratory: Clear to auscultation bilaterally with equal chest rise  Abdomen:  Soft, nontender, nondistended, + bowel sounds  Extremities: warm dry without cyanosis clubbing or edema  Neuro: AAOx3, left hemianopsia and facial weakness, otherwise nonfocal  Psych: Normal affect and demeanor with intact judgement and insight  Data Reviewed: I have personally reviewed following labs and imaging studies  CBC: Recent Labs  Lab 02/04/17 1507 02/04/17 1526 02/05/17 0401 02/06/17 0250  WBC 5.3  --  5.2 4.9  NEUTROABS 3.5  --   --   --   HGB 10.8* 11.2* 10.0* 9.9*  HCT 35.7* 33.0* 32.1* 32.6*  MCV 92.5  --  91.2 91.3  PLT 125*  --  112* 269*   Basic Metabolic Panel: Recent Labs  Lab 02/04/17 1507 02/04/17 1526 02/05/17 0401 02/06/17 0250  NA 140 143 140 138  K 3.8 3.8 4.4 4.1  CL 109 109 108 106  CO2 23  --  24 24  GLUCOSE 124* 119* 85 89  BUN 41* 40* 36* 35*  CREATININE 1.70* 1.80* 1.69* 1.70*  CALCIUM 9.7  --  9.2 8.8*   GFR: Estimated Creatinine Clearance: 34.9 mL/min (A) (by C-G formula based on SCr of 1.7 mg/dL (H)). Liver Function Tests: Recent Labs  Lab 02/04/17 1507  AST 21  ALT 17  ALKPHOS 58  BILITOT 0.7  PROT 6.4*  ALBUMIN 3.7   No results for input(s): LIPASE, AMYLASE in the last 168 hours. No results for input(s): AMMONIA in the last 168 hours. Coagulation Profile: Recent Labs  Lab 02/04/17 1507 02/05/17 0401 02/06/17 0250  INR 3.23 3.14 2.29   Cardiac Enzymes: No results for input(s): CKTOTAL, CKMB, CKMBINDEX, TROPONINI in the last 168 hours. BNP (last 3 results) No results for input(s): PROBNP in the last 8760 hours. HbA1C: No results for input(s): HGBA1C in the last 72 hours. CBG: Recent Labs  Lab 02/04/17 1532  GLUCAP 115*   Lipid Profile: Recent Labs    02/06/17 0250  CHOL 196  HDL 20*  LDLCALC 139*  TRIG 185*  CHOLHDL 9.8   Thyroid Function Tests: No results for input(s): TSH, T4TOTAL, FREET4, T3FREE, THYROIDAB in the last 72 hours. Anemia Panel: No results for input(s): VITAMINB12, FOLATE,  FERRITIN, TIBC, IRON, RETICCTPCT in the last 72 hours. Urine analysis:    Component Value Date/Time   COLORURINE YELLOW 01/25/2017 1814   APPEARANCEUR CLEAR 01/25/2017 1814   LABSPEC 1.008 01/25/2017 1814   PHURINE 6.0 01/25/2017 1814   GLUCOSEU NEGATIVE 01/25/2017 1814   HGBUR NEGATIVE 01/25/2017 1814   BILIRUBINUR NEGATIVE 01/25/2017 1814   KETONESUR NEGATIVE 01/25/2017 1814   PROTEINUR NEGATIVE 01/25/2017 1814   UROBILINOGEN 0.2 05/10/2013 1015   NITRITE NEGATIVE 01/25/2017 1814   LEUKOCYTESUR NEGATIVE 01/25/2017 1814   Sepsis Labs: @LABRCNTIP (procalcitonin:4,lacticidven:4)  )No results found for this or any previous visit (from the past 240 hour(s)).    Radiology Studies: Mr Jeri Cos And Wo Contrast  Result Date: 02/04/2017 CLINICAL DATA:  Focal neuro deficit for greater than 6 hours. Abnormal CT of the head. Left facial droop and abnormal gait. Unknown time of onset. EXAM: MRI HEAD WITHOUT AND WITH CONTRAST TECHNIQUE: Multiplanar, multiecho pulse sequences of the brain and surrounding structures were obtained without and with intravenous contrast. CONTRAST:  <See Chart> MULTIHANCE GADOBENATE DIMEGLUMINE 529 MG/ML IV SOLN COMPARISON:  CT head without contrast from the same day. FINDINGS: Brain: Enhancing mass lesion centered in the inferior right frontal lobe across seen midline.  There is involvement of the anterior cingulate gyrus as well as the rostrum any anterior genu of the corpus callosum. Anterior cerebral arteries extend through the lesion. The is slightly hyperintense on T2 weighted images with extensive heterogeneity. There is diffuse enhancement. The lesion measures 5.3 x 4.0 x 3.6 cm. The lesion involves the rostrum any anterior genu of the corpus callosum. The lesion appears to be centered inferior to the ventricles but extends superiorly in the frontal lobes, right greater than left. Surrounding vasogenic edema is confirmed. While there is some suggestion of CSF cleft along  the superior aspect of the tumor, the lesion is inseparable from the corpus callosum on the postcontrast images. Mild restricted diffusion is present within the lesion. A 0.5 x 0.7 x 1.2 cm lesion is present along the posterior aspect brainstem and ventral surface of the fourth ventricle. There is involvement of the right inferior vermis extending posteriorly. No other discrete foci of enhancement are present. There is no leptomeningeal enhancement. Moderate bilateral subcortical T2 signal changes are present bilaterally. 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 Vascular: Flow is present in the major intracranial arteries. Skull and upper cervical spine: The skullbase is within normal limits. The craniocervical junction is normal. There is fusion across the C3-4 disc space. Marked degenerative changes are present at C4-5 and C5-6. Marrow signal is otherwise normal for age. Sinuses/Orbits: Mild mucosal thickening is present in the inferior maxillary sinuses and scattered throughout the ethmoid air cells. No significant fluid levels are present. Mastoid air cells are clear. Bilateral lens replacements are present. The globes and orbits are otherwise within normal limits. IMPRESSION: 1. Heterogeneously enhancing mass lesion in the anterior inferior frontal lobes crosses midline. The tumor measures 5.3 by 3.6 x 4.0 cm. This lesion may be extra-axial. 2. Similar enhancing lesions with restricted diffusion are noted in the fourth ventricle and involving the right side of the inferior cerebellar vermis. 3. 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 Metastatic disease is considered less likely. The diffusion-weighted images do not show the typical level of restricted diffusion seen with lymphoma. Lumbar puncture may be useful for diagnosis given the likelihood of CSF tumor spread. MRI of the lumbar spine without and with contrast  could also be utilized to evaluate for distal tumor spread. 4. Diffuse subcortical white matter changes bilaterally likely reflect the sequela of chronic microvascular ischemia. These results were called by telephone at the time of interpretation on 02/04/2017 at 7:16 pm to Dr. Rory Percy, who verbally acknowledged these results. Electronically Signed   By: San Morelle M.D.   On: 02/04/2017 19:17   Ct Head Code Stroke Wo Contrast  Result Date: 02/04/2017 CLINICAL DATA:  Code stroke. 71 year old male with left facial droop and abnormal gait. Unknown time of onset. EXAM: CT HEAD WITHOUT CONTRAST TECHNIQUE: Contiguous axial images were obtained from the base of the skull through the vertex without intravenous contrast. COMPARISON:  None. FINDINGS: Brain: There is architectural distortion along the anterior corpus callosum, such that no normal genu or rostrum are identified. There is suggestion posterior mass effect on the right frontal horn on series 3, image 15. There is associated abnormal hypodensity in both anterior inferior frontal gyri which more resembles vasogenic edema (instead of cytotoxic edema) series 3, image 13. In the genu region there is abnormal cortical thickening (primarily in the right hemisphere) and mass like heterogeneity as seen on coronal image 24.  No acute intracranial hemorrhage identified. No ventriculomegaly. No areas suggestive of cytotoxic edema identified. The body and splenium of the corpus callosum appear normal. No cortical encephalomalacia identified. Vascular: Calcified atherosclerosis at the skull base. No suspicious intracranial vascular hyperdensity. Skull: No acute osseous abnormality identified. Sinuses/Orbits: Clear. Other: Calcified scalp vasculature. No acute orbit or scalp soft tissue findings. ASPECTS [facility] Stroke Program Early CT Score) Total score (0-10 with 10 being normal): 10. IMPRESSION: 1. Abnormal appearance of both anterior inferior frontal lobes and the  anterior corpus callosum, but more resembling a tumor which has crossed midline rather than infarct. Recommend Brain MRI without and with contrast when possible. 2. No acute intracranial hemorrhage. 3. ASPECTS is 10. 4. The above was relayed via text pager to Dr. Jerelyn Charles on 02/04/2017 at 1541 hours. Electronically Signed   By: Genevie Ann M.D.   On: 02/04/2017 15:45     Scheduled Meds: . cholecalciferol  1,000 Units Oral Daily  . doxazosin  8 mg Oral BID  . mycophenolate  360 mg Oral BID  . pneumococcal 23 valent vaccine  0.5 mL Intramuscular Tomorrow-1000  . predniSONE  5 mg Oral Q breakfast  . sodium chloride flush  3 mL Intravenous Q12H  . tacrolimus  1 mg Oral BID   Continuous Infusions: . sodium chloride       LOS: 0 days   Time Spent in minutes   30 minutes  Jeffrey Perez D.O. on 02/06/2017 at 10:37 AM  Between 7am to 7pm - Pager - [phone]  After 7pm go to [email or web address] - password [email or web address]  And look for the night coverage person covering for me after hours  [facility] Office  [phone]

## 2017-02-07 ENCOUNTER — Inpatient Hospital Stay (HOSPITAL_COMMUNITY): Payer: Medicare Other

## 2017-02-07 ENCOUNTER — Encounter (HOSPITAL_COMMUNITY): Payer: Self-pay | Admitting: General Practice

## 2017-02-07 LAB — CBC
HCT: 30.8 % — ABNORMAL LOW (ref 39.0–52.0)
Hemoglobin: 9.6 g/dL — ABNORMAL LOW (ref 13.0–17.0)
MCH: 28.5 pg (ref 26.0–34.0)
MCHC: 31.2 g/dL (ref 30.0–36.0)
MCV: 91.4 fL (ref 78.0–100.0)
PLATELETS: 99 10*3/uL — AB (ref 150–400)
RBC: 3.37 MIL/uL — AB (ref 4.22–5.81)
RDW: 14.6 % (ref 11.5–15.5)
WBC: 4.3 10*3/uL (ref 4.0–10.5)

## 2017-02-07 LAB — PROTIME-INR
INR: 1.99
PROTHROMBIN TIME: 22.4 s — AB (ref 11.4–15.2)

## 2017-02-07 MED ORDER — HEPARIN (PORCINE) IN NACL 100-0.45 UNIT/ML-% IJ SOLN
1100.0000 [IU]/h | INTRAMUSCULAR | Status: DC
  Administered 2017-02-07: 1000 [IU]/h via INTRAVENOUS
  Administered 2017-02-08: 1100 [IU]/h via INTRAVENOUS
  Filled 2017-02-07 (×2): qty 250

## 2017-02-07 NOTE — Progress Notes (Signed)
Neurosurgery Progress Note  No issues overnight. No concerns this am  EXAM:  BP 117/61 (BP Location: Right Arm)   Pulse 97   Temp 97.7 F (36.5 C) (Oral)   Resp 20   Ht 5\' 6"  (1.676 m)   Wt 66 kg (145 lb 8.1 oz)   SpO2 99%   BMI 23.48 kg/m   Awake, alert, oriented  Left facial droop MAEW  PLAN Stable Proceed with biospy Thursday Ordered CT head with brain lab protocol in prep for biospy

## 2017-02-07 NOTE — Progress Notes (Signed)
PROGRESS NOTE    Mete Purdum  BPZ:[id number] DOB: [birth year] DOA: 02/04/2017 PCP: System, Pcp Not In   Chief Complaint  Patient presents with  . Facial Droop    Brief Narrative:  HPI on 02/04/2017 Ercell Razon is a 71 y.o. male with a medical history of renal transplant/end-stage renal disease, gout, hypertension, anemia, who presented to the emergency department with complaints of left facial droop and concern for stroke. Patient was recently admitted and discharged on 01/28/2017 for generalized weakness. Patient reports that since leaving the hospital last week, he started having some left-sided weakness of his face. Family friend present at bedside, reported that she noticed it was worse today. Patient also reported some difficulty walking and dizziness. Denies any recent illness, travel. Denies chest pain, shortness of breath, abdominal pain, nausea vomiting, diarrhea or constipation, changes in urinary or bowel habits.   Assessment & Plan   Left facial droop/Brain lesion -?Bells palsy -CT head: Abnormal appearance of both anterior inferior frontal lobes and anterior corpus callosum, resembling a tumor which has crossed midline rather than infarct.  -MRI with and without contrast: Heterogeneously enhancing mass lesion in the anterior inferior frontal lobes crosses midline, measures 5.3 x 3.6 x 4.0 cm -Neurology consulted and appreciated -Neurosurgery consulted and appreciated, recommended large volume lumbar puncture with cytology and flow cytometry, however had tumor board today and now recommending sterotactic biopsy which will probably occur on 08/09/2016  -Interventional radiology consulted and appreciated, however no longer needed lumbar puncture -Continue eye drops as he complains that his left eye is burning (as he cannot blink) -neurosurgery ordered CT head with brain lab protocol  History of renal transplant/CKD -Transplant occurred 25 years ago, no longer on  dialysis -Unknown baseline creatinine, currently 1.70 -Continue monitor BMP -Continue mycophenolate, prednisone, tacrolimus  Paroxysmal atrial fibrillation -Currently in sinus rhythm -CHADSVASC 2 (based on age and HTN) -Coumadin held, INR now 1.99 -Heparin ordered for when INR <2 -Discussed reversal with neurology, did not recommend as this could make patient more thrombotic given this mass, allow for INR to trend downward  Chronic mesenteric anemia/thrombocytopenia -Hemoglobin and platelets appears to be at baseline -Continue monitor CBC  Essential hypertension -Continue Cardura  DVT Prophylaxis  Coumadin/heparin  Code Status: Full  Family Communication: None at bedside  Disposition Plan: Admitted. Pending biopsy later this week  Consultants Neurology Neurosurgery Interventional radiology  Procedures  None  Antibiotics   Anti-infectives (From admission, onward)   None      Subjective:   Notnamed Martine seen and examined today.  Continues to have left facial droop and complains of eye burning. Denies chest pain, shortness of breath, abdominal pain, N/V/D/C.    Objective:   Vitals:   02/06/17 2101 02/07/17 0036 02/07/17 0526 02/07/17 0953  BP: 118/78 120/64 117/72 117/61  Pulse: 67 66 100 97  Resp: 20 20 20 20   Temp: 98 F (36.7 C) 98.3 F (36.8 C) 98.3 F (36.8 C) 97.7 F (36.5 C)  TempSrc: Oral Oral Oral Oral  SpO2: 97% 98% 93% 99%  Weight:      Height:        Intake/Output Summary (Last 24 hours) at 02/07/2017 1334 Last data filed at 02/07/2017 0900 Gross per 24 hour  Intake 243 ml  Output -  Net 243 ml   Filed Weights   02/04/17 1949 02/06/17 1344  Weight: 65.1 kg (143 lb 9.6 oz) 66 kg (145 lb 8.1 oz)   Exam (no change from exam on 02/06/2017)  General:  Well developed, well nourished, NAD, appears stated age  39: NCAT, mucous membranes moist. Left facial droop  Cardiovascular: S1 S2 auscultated, RRR, +SEM  Respiratory: Clear to  auscultation bilaterally with equal chest rise  Abdomen: Soft, nontender, nondistended, + bowel sounds  Extremities: warm dry without cyanosis clubbing or edema  Neuro: AAOx3, left hemianopsia and facial weakness, otherwise nonfocal  Psych: Normal affect and demeanor with intact judgement and insight  Data Reviewed: I have personally reviewed following labs and imaging studies  CBC: Recent Labs  Lab 02/04/17 1507 02/04/17 1526 02/05/17 0401 02/06/17 0250 02/07/17 0534  WBC 5.3  --  5.2 4.9 4.3  NEUTROABS 3.5  --   --   --   --   HGB 10.8* 11.2* 10.0* 9.9* 9.6*  HCT 35.7* 33.0* 32.1* 32.6* 30.8*  MCV 92.5  --  91.2 91.3 91.4  PLT 125*  --  112* 113* 99*   Basic Metabolic Panel: Recent Labs  Lab 02/04/17 1507 02/04/17 1526 02/05/17 0401 02/06/17 0250  NA 140 143 140 138  K 3.8 3.8 4.4 4.1  CL 109 109 108 106  CO2 23  --  24 24  GLUCOSE 124* 119* 85 89  BUN 41* 40* 36* 35*  CREATININE 1.70* 1.80* 1.69* 1.70*  CALCIUM 9.7  --  9.2 8.8*   GFR: Estimated Creatinine Clearance: 34.9 mL/min (A) (by C-G formula based on SCr of 1.7 mg/dL (H)). Liver Function Tests: Recent Labs  Lab 02/04/17 1507  AST 21  ALT 17  ALKPHOS 58  BILITOT 0.7  PROT 6.4*  ALBUMIN 3.7   No results for input(s): LIPASE, AMYLASE in the last 168 hours. No results for input(s): AMMONIA in the last 168 hours. Coagulation Profile: Recent Labs  Lab 02/04/17 1507 02/05/17 0401 02/06/17 0250 02/07/17 0534  INR 3.23 3.14 2.29 1.99   Cardiac Enzymes: No results for input(s): CKTOTAL, CKMB, CKMBINDEX, TROPONINI in the last 168 hours. BNP (last 3 results) No results for input(s): PROBNP in the last 8760 hours. HbA1C: No results for input(s): HGBA1C in the last 72 hours. CBG: Recent Labs  Lab 02/04/17 1532  GLUCAP 115*   Lipid Profile: Recent Labs    02/06/17 0250  CHOL 196  HDL 20*  LDLCALC 139*  TRIG 185*  CHOLHDL 9.8   Thyroid Function Tests: No results for input(s): TSH,  T4TOTAL, FREET4, T3FREE, THYROIDAB in the last 72 hours. Anemia Panel: No results for input(s): VITAMINB12, FOLATE, FERRITIN, TIBC, IRON, RETICCTPCT in the last 72 hours. Urine analysis:    Component Value Date/Time   COLORURINE YELLOW 01/25/2017 1814   APPEARANCEUR CLEAR 01/25/2017 1814   LABSPEC 1.008 01/25/2017 1814   PHURINE 6.0 01/25/2017 1814   GLUCOSEU NEGATIVE 01/25/2017 1814   HGBUR NEGATIVE 01/25/2017 1814   BILIRUBINUR NEGATIVE 01/25/2017 1814   KETONESUR NEGATIVE 01/25/2017 1814   PROTEINUR NEGATIVE 01/25/2017 1814   UROBILINOGEN 0.2 05/10/2013 1015   NITRITE NEGATIVE 01/25/2017 1814   LEUKOCYTESUR NEGATIVE 01/25/2017 1814   Sepsis Labs: @LABRCNTIP (procalcitonin:4,lacticidven:4)  )No results found for this or any previous visit (from the past 240 hour(s)).    Radiology Studies: No results found.   Scheduled Meds: . cholecalciferol  1,000 Units Oral Daily  . doxazosin  8 mg Oral BID  . mycophenolate  360 mg Oral BID  . pneumococcal 23 valent vaccine  0.5 mL Intramuscular Tomorrow-1000  . predniSONE  5 mg Oral Q breakfast  . sodium chloride flush  3 mL Intravenous Q12H  . tacrolimus  1  mg Oral BID   Continuous Infusions: . sodium chloride    . heparin       LOS: 1 day   Time Spent in minutes   30 minutes  Donae Kueker D.O. on 02/07/2017 at 1:34 PM  Between 7am to 7pm - Pager - [phone]  After 7pm go to [email or web address] - password [email or web address]  And look for the night coverage person covering for me after hours  [facility] Office  [phone]

## 2017-02-07 NOTE — Progress Notes (Signed)
[employer] for warfarin Indication: atrial fibrillation  No Known Allergies  Patient Measurements: Height: 5\' 6"  (167.6 cm) Weight: 145 lb 8.1 oz (66 kg) IBW/kg (Calculated) : 63.8  Heparin dosing weight: 66 kg   Vital Signs: Temp: 97.7 F (36.5 C) (11/06 0953) Temp Source: Oral (11/06 0953) BP: 117/61 (11/06 0953) Pulse Rate: 97 (11/06 0953)  Labs: Recent Labs    02/04/17 1507 02/04/17 1526 02/05/17 0401 02/06/17 0250 02/07/17 0534  HGB 10.8* 11.2* 10.0* 9.9* 9.6*  HCT 35.7* 33.0* 32.1* 32.6* 30.8*  PLT 125*  --  112* 113* 99*  APTT 40*  --   --   --   --   LABPROT 32.7*  --  32.0* 25.0* 22.4*  INR 3.23  --  3.14 2.29 1.99  CREATININE 1.70* 1.80* 1.69* 1.70*  --     Assessment: 71 year old male seen in ED with facial drop- has been worsening over last several days to 1 week. Stroke ruled out. Imaging shows mass in frontal lobe and lesions in 4th ventricle.  INR 1.99. Given INR is nearly 2, will delay start of heparin gtt until this evening. Bridging patient to heparin gtt in anticipation of biopsy.  On warfarin for AFib. Home dose was 5mg  daily.  Hgb 10.8, plts 125.  Goal of Therapy:  INR 2-3 Monitor platelets by anticoagulation protocol: Yes   Plan:  -Heparin 1000 units/hr -Daily HL, CBC -Heparin drip to start later this evening, first level with AM labs    Hughes Better, PharmD, BCPS Clinical Pharmacist 02/07/2017 11:09 AM

## 2017-02-08 LAB — BASIC METABOLIC PANEL
Anion gap: 8 (ref 5–15)
BUN: 41 mg/dL — AB (ref 6–20)
CALCIUM: 8.8 mg/dL — AB (ref 8.9–10.3)
CHLORIDE: 109 mmol/L (ref 101–111)
CO2: 21 mmol/L — AB (ref 22–32)
CREATININE: 1.79 mg/dL — AB (ref 0.61–1.24)
GFR calc non Af Amer: 36 mL/min — ABNORMAL LOW (ref >60)
GFR, EST AFRICAN AMERICAN: 42 mL/min — AB (ref >60)
GLUCOSE: 86 mg/dL (ref 65–99)
Potassium: 3.7 mmol/L (ref 3.5–5.1)
Sodium: 138 mmol/L (ref 135–145)

## 2017-02-08 LAB — HEPARIN LEVEL (UNFRACTIONATED)
Heparin Unfractionated: 0.22 IU/mL — ABNORMAL LOW (ref 0.30–0.70)
Heparin Unfractionated: 0.42 IU/mL (ref 0.30–0.70)

## 2017-02-08 LAB — CBC
HCT: 31.3 % — ABNORMAL LOW (ref 39.0–52.0)
Hemoglobin: 9.8 g/dL — ABNORMAL LOW (ref 13.0–17.0)
MCH: 28.3 pg (ref 26.0–34.0)
MCHC: 31.3 g/dL (ref 30.0–36.0)
MCV: 90.5 fL (ref 78.0–100.0)
PLATELETS: 101 10*3/uL — AB (ref 150–400)
RBC: 3.46 MIL/uL — AB (ref 4.22–5.81)
RDW: 14.5 % (ref 11.5–15.5)
WBC: 4.6 10*3/uL (ref 4.0–10.5)

## 2017-02-08 LAB — PROTIME-INR
INR: 1.65
PROTHROMBIN TIME: 19.4 s — AB (ref 11.4–15.2)

## 2017-02-08 MED ORDER — CHLORHEXIDINE GLUCONATE CLOTH 2 % EX PADS
6.0000 | MEDICATED_PAD | Freq: Once | CUTANEOUS | Status: AC
  Administered 2017-02-08: 6 via TOPICAL

## 2017-02-08 MED ORDER — CEFAZOLIN SODIUM-DEXTROSE 2-4 GM/100ML-% IV SOLN
2.0000 g | INTRAVENOUS | Status: AC
  Administered 2017-02-09: 2 g via INTRAVENOUS
  Filled 2017-02-08: qty 100

## 2017-02-08 MED ORDER — CEFAZOLIN SODIUM-DEXTROSE 2-4 GM/100ML-% IV SOLN: 2.0000 g | INTRAVENOUS | Status: DC

## 2017-02-08 MED ORDER — VITAMIN K1 10 MG/ML IJ SOLN
2.5000 mg | Freq: Once | INTRAVENOUS | Status: AC
  Administered 2017-02-08: 2.5 mg via INTRAVENOUS
  Filled 2017-02-08: qty 0.25

## 2017-02-08 MED ORDER — HEPARIN (PORCINE) IN NACL 100-0.45 UNIT/ML-% IJ SOLN: 1100.0000 [IU]/h | INTRAMUSCULAR | Status: AC

## 2017-02-08 MED ORDER — DEXTROSE 5 % IV SOLN: 10.0000 mg | Freq: Once | INTRAVENOUS | Status: DC

## 2017-02-08 NOTE — Progress Notes (Signed)
Per MD, discontinue heparin and NPO after midnight.

## 2017-02-08 NOTE — Progress Notes (Signed)
PROGRESS NOTE    Jeffrey Perez  ZOX:[id number] DOB: [birth year] DOA: 02/04/2017 PCP: System, Pcp Not In   Chief Complaint  Patient presents with  . Facial Droop    Brief Narrative:  HPI on 02/04/2017 Jeffrey Perez is a 71 y.o. male with a medical history of renal transplant/end-stage renal disease, gout, hypertension, anemia, who presented to the emergency department with complaints of left facial droop and concern for stroke. Patient was recently admitted and discharged on 01/28/2017 for generalized weakness. Patient reports that since leaving the hospital last week, he started having some left-sided weakness of his face. Family friend present at bedside, reported that she noticed it was worse today. Patient also reported some difficulty walking and dizziness. Denies any recent illness, travel. Denies chest pain, shortness of breath, abdominal pain, nausea vomiting, diarrhea or constipation, changes in urinary or bowel habits.   Assessment & Plan   Left facial droop/Brain lesion -?Bells palsy -CT head: Abnormal appearance of both anterior inferior frontal lobes and anterior corpus callosum, resembling a tumor which has crossed midline rather than infarct.  -MRI with and without contrast: Heterogeneously enhancing mass lesion in the anterior inferior frontal lobes crosses midline, measures 5.3 x 3.6 x 4.0 cm -Neurology consulted and appreciated -Neurosurgery consulted and appreciated, recommended large volume lumbar puncture with cytology and flow cytometry, however had tumor board today and now recommending sterotactic biopsy which will probably occur on 08/09/2016  -Interventional radiology consulted and appreciated, however no longer needed lumbar puncture -Continue eye drops as he complains that his left eye is burning (as he cannot blink) -neurosurgery will perform a stereotactic brain biopsy tomorrow  History of renal transplant/CKD -Transplant occurred 25 years ago, no longer on  dialysis -Unknown baseline creatinine, currently 1.70 -Continue monitor BMP -Continue mycophenolate, prednisone, tacrolimus  Paroxysmal atrial fibrillation -Currently in sinus rhythm -CHADSVASC 2 (based on age and HTN) -Coumadin held, INR now 1.99 -Heparin ordered for when INR <2 -Discussed reversal with neurology, did not recommend as this could make patient more thrombotic given this mass, allow for INR to trend downward Patient is already on heparin with therapeutic heparin level today, therefore will give vitamin K to ensure safe surgery tomorrow.  Heparin will be held after midnight as well.  Chronic mesenteric anemia/thrombocytopenia -Hemoglobin and platelets appears to be at baseline -Continue monitor CBC  Essential hypertension -Continue Cardura  DVT Prophylaxis on heparin right now, SCD after surgery until cleared by neurosurgery.  Code Status: Full  Family Communication: None at bedside  Disposition Plan: Admitted. Pending biopsy later this week  Consultants Neurology Neurosurgery Interventional radiology  Procedures  None  Antibiotics   Anti-infectives (From admission, onward)   None      Subjective:   Jeffrey Perez seen and examined today.  Denies any acute complaint.  No nausea no vomiting.  No active pain.  Objective:   Vitals:   02/08/17 0504 02/08/17 1008 02/08/17 1336 02/08/17 1749  BP: 115/64 99/63 139/73 126/65  Pulse: 84 75 (!) 55 70  Resp: 18 18 18 18   Temp: 98.3 F (36.8 C) 98.2 F (36.8 C) 97.6 F (36.4 C) 98.2 F (36.8 C)  TempSrc: Oral Oral Oral Oral  SpO2: 100% 97% 100% 100%  Weight:      Height:        Intake/Output Summary (Last 24 hours) at 02/08/2017 1834 Last data filed at 02/08/2017 1800 Gross per 24 hour  Intake 991.75 ml  Output 1100 ml  Net -108.25 ml   [employer]  02/04/17 1949 02/06/17 1344  Weight: 65.1 kg (143 lb 9.6 oz) 66 kg (145 lb 8.1 oz)   Exam (no change from exam on 02/06/2017)  General:  Well developed, well nourished, NAD, appears stated age  20: NCAT, mucous membranes moist. Left facial droop  Cardiovascular: S1 S2 auscultated, RRR, +SEM  Respiratory: Clear to auscultation bilaterally with equal chest rise  Abdomen: Soft, nontender, nondistended, + bowel sounds  Extremities: warm dry without cyanosis clubbing or edema  Neuro: AAOx3, left hemianopsia and facial weakness, otherwise nonfocal  Psych: Normal affect and demeanor with intact judgement and insight  Data Reviewed: I have personally reviewed following labs and imaging studies  CBC: Recent Labs  Lab 02/04/17 1507 02/04/17 1526 02/05/17 0401 02/06/17 0250 02/07/17 0534 02/08/17 0444  WBC 5.3  --  5.2 4.9 4.3 4.6  NEUTROABS 3.5  --   --   --   --   --   HGB 10.8* 11.2* 10.0* 9.9* 9.6* 9.8*  HCT 35.7* 33.0* 32.1* 32.6* 30.8* 31.3*  MCV 92.5  --  91.2 91.3 91.4 90.5  PLT 125*  --  112* 113* 99* 086*   Basic Metabolic Panel: Recent Labs  Lab 02/04/17 1507 02/04/17 1526 02/05/17 0401 02/06/17 0250 02/08/17 0444  NA 140 143 140 138 138  K 3.8 3.8 4.4 4.1 3.7  CL 109 109 108 106 109  CO2 23  --  24 24 21*  GLUCOSE 124* 119* 85 89 86  BUN 41* 40* 36* 35* 41*  CREATININE 1.70* 1.80* 1.69* 1.70* 1.79*  CALCIUM 9.7  --  9.2 8.8* 8.8*   GFR: Estimated Creatinine Clearance: 33.2 mL/min (A) (by C-G formula based on SCr of 1.79 mg/dL (H)). Liver Function Tests: Recent Labs  Lab 02/04/17 1507  AST 21  ALT 17  ALKPHOS 58  BILITOT 0.7  PROT 6.4*  ALBUMIN 3.7   No results for input(s): LIPASE, AMYLASE in the last 168 hours. No results for input(s): AMMONIA in the last 168 hours. Coagulation Profile: Recent Labs  Lab 02/04/17 1507 02/05/17 0401 02/06/17 0250 02/07/17 0534 02/08/17 0444  INR 3.23 3.14 2.29 1.99 1.65   Cardiac Enzymes: No results for input(s): CKTOTAL, CKMB, CKMBINDEX, TROPONINI in the last 168 hours. BNP (last 3 results) No results for input(s): PROBNP in the last  8760 hours. HbA1C: No results for input(s): HGBA1C in the last 72 hours. CBG: Recent Labs  Lab 02/04/17 1532  GLUCAP 115*   Lipid Profile: Recent Labs    02/06/17 0250  CHOL 196  HDL 20*  LDLCALC 139*  TRIG 185*  CHOLHDL 9.8   Thyroid Function Tests: No results for input(s): TSH, T4TOTAL, FREET4, T3FREE, THYROIDAB in the last 72 hours. Anemia Panel: No results for input(s): VITAMINB12, FOLATE, FERRITIN, TIBC, IRON, RETICCTPCT in the last 72 hours. Urine analysis:    Component Value Date/Time   COLORURINE YELLOW 01/25/2017 1814   APPEARANCEUR CLEAR 01/25/2017 1814   LABSPEC 1.008 01/25/2017 1814   PHURINE 6.0 01/25/2017 1814   GLUCOSEU NEGATIVE 01/25/2017 1814   HGBUR NEGATIVE 01/25/2017 1814   BILIRUBINUR NEGATIVE 01/25/2017 1814   KETONESUR NEGATIVE 01/25/2017 1814   PROTEINUR NEGATIVE 01/25/2017 1814   UROBILINOGEN 0.2 05/10/2013 1015   NITRITE NEGATIVE 01/25/2017 1814   LEUKOCYTESUR NEGATIVE 01/25/2017 1814   Sepsis Labs: @LABRCNTIP (procalcitonin:4,lacticidven:4)  )No results found for this or any previous visit (from the past 240 hour(s)).    Radiology Studies: Ct Head Wo Contrast  Result Date: 02/07/2017 CLINICAL DATA:  71 y/o  M; brain mass. EXAM: CT HEAD WITHOUT CONTRAST TECHNIQUE: Contiguous axial images were obtained from the base of the skull through the vertex without intravenous contrast. COMPARISON:  02/04/2017 CT head and MRI head. FINDINGS: Brain: Small masses extending into the lower fourth ventricle along posterior brainstem and right anterior vermis (series 3, image 52). Mass involving medial frontal lobes crossing splenium of corpus callosum surrounding edema. Mass effect effaces frontal horns of lateral ventricles. No acute intracranial hemorrhage or herniation. Vascular: No hyperdense vessel or unexpected calcification. Skull: Normal. Negative for fracture or focal lesion. Sinuses/Orbits: No acute finding. Other: None. IMPRESSION: 1. Large mass  involving medial frontal lobes and genu of corpus callosum best delineated on postcontrast MRI brain. Mass effect with effacement of frontal horns of lateral ventricles. 2. Two additional small masses within the margins of lower fourth ventricle. 3. No acute intracranial abnormality. Electronically Signed   By: Kristine Garbe M.D.   On: 02/07/2017 14:33     Scheduled Meds: . cholecalciferol  1,000 Units Oral Daily  . doxazosin  8 mg Oral BID  . mycophenolate  360 mg Oral BID  . pneumococcal 23 valent vaccine  0.5 mL Intramuscular Tomorrow-1000  . predniSONE  5 mg Oral Q breakfast  . sodium chloride flush  3 mL Intravenous Q12H  . tacrolimus  1 mg Oral BID   Continuous Infusions: . sodium chloride    . heparin 1,100 Units/hr (02/08/17 1656)     LOS: 2 days   Time Spent in minutes   30 minutes Author:  Berle Mull, MD [facility] Pager: [phone] 02/08/2017 6:36 PM     After 7pm go to [email or web address] - password [email or web address]  And look for the night coverage person covering for me after hours  [facility] Office  [phone]

## 2017-02-08 NOTE — Progress Notes (Signed)
[place] for heparin Indication: atrial fibrillation  No Known Allergies  Patient Measurements: Height: 5\' 6"  (167.6 cm) Weight: 145 lb 8.1 oz (66 kg) IBW/kg (Calculated) : 63.8  Heparin dosing weight: 66 kg   Vital Signs: Temp: 97.6 F (36.4 C) (11/07 1336) Temp Source: Oral (11/07 1336) BP: 139/73 (11/07 1336) Pulse Rate: 55 (11/07 1336)  Labs: Recent Labs    02/06/17 0250 02/07/17 0534 02/08/17 0444 02/08/17 1256  HGB 9.9* 9.6* 9.8*  --   HCT 32.6* 30.8* 31.3*  --   PLT 113* 99* 101*  --   LABPROT 25.0* 22.4* 19.4*  --   INR 2.29 1.99 1.65  --   HEPARINUNFRC  --   --  0.22* 0.42  CREATININE 1.70*  --  1.79*  --     Assessment: 71 year old male seen in ED with facial drop- has been worsening over last several days to 1 week. Stroke ruled out. Imaging shows mass in frontal lobe and lesions in 4th ventricle.   Warfarin for Afib is on hold for biopsy tomorrow; heparin drip continues. Heparin level is therapeutic at 0.42 on 1100 units/hr.  Goal of Therapy:  Heparin level 0.3-0.7 units/ml Monitor platelets by anticoagulation protocol: Yes   Plan:  -Continue heparin drip at 1100 units/hr -Confirmatory heparin level at 20:00 -Daily heparin level, CBC -Monitor for s/sx of bleeding   Renold Genta, PharmD, BCPS Clinical Pharmacist Phone for today - [place] - [phone] 02/08/2017 2:32 PM

## 2017-02-08 NOTE — Progress Notes (Signed)
ANTICOAGULATION CONSULT NOTE - Follow Up Consult  Pharmacy Consult for heparin Indication: atrial fibrillation  Labs: Recent Labs    02/06/17 0250 02/07/17 0534 02/08/17 0444  HGB 9.9* 9.6* 9.8*  HCT 32.6* 30.8* 31.3*  PLT 113* 99* 101*  LABPROT 25.0* 22.4* 19.4*  INR 2.29 1.99 1.65  HEPARINUNFRC  --   --  0.22*  CREATININE 1.70*  --   --     Assessment: 71yo male subtherapeutic on heparin with initial dosing while Coumadin on hold.  Goal of Therapy:  Heparin level 0.3-0.7 units/ml   Plan:  Will increase heparin gtt by 10% to 1100 units/hr and check level in [place], PharmD, BCPS  02/08/2017,5:51 AM

## 2017-02-08 NOTE — Progress Notes (Signed)
Pt seen and examined. No issues overnight.  EXAM: Temp:  [97.6 F (36.4 C)-98.3 F (36.8 C)] 97.6 F (36.4 C) (11/07 1336) Pulse Rate:  [55-88] 55 (11/07 1336) Resp:  [18-20] 18 (11/07 1336) BP: (99-139)/(63-77) 139/73 (11/07 1336) SpO2:  [97 %-100 %] 100 % (11/07 1336) Intake/Output      11/06 0701 - 11/07 0700 11/07 0701 - 11/08 0700   P.O. 1080 120   I.V. (mL/kg) 89.8 (1.4)    Total Intake(mL/kg) 1169.8 (17.7) 120 (1.8)   Urine (mL/kg/hr) 850 (0.5) 250 (0.4)   Total Output 850 250   Net +319.8 -130        Urine Occurrence 5 x     Awake and alert Neuro exam unchanged  Stable Brain biopsy tomorrow Check INR in AM Vitamin K today

## 2017-02-09 ENCOUNTER — Inpatient Hospital Stay (HOSPITAL_COMMUNITY): Admission: RE | Admit: 2017-02-09 | Payer: Medicare Other | Source: Ambulatory Visit | Admitting: Neurological Surgery

## 2017-02-09 ENCOUNTER — Inpatient Hospital Stay (HOSPITAL_COMMUNITY): Payer: Medicare Other | Admitting: Anesthesiology

## 2017-02-09 ENCOUNTER — Encounter (HOSPITAL_COMMUNITY): Payer: Self-pay | Admitting: Certified Registered Nurse Anesthetist

## 2017-02-09 ENCOUNTER — Encounter (HOSPITAL_COMMUNITY)
Admission: EM | Disposition: A | Discharge status: Home Health | Payer: Self-pay | Source: Home / Self Care | Attending: Internal Medicine

## 2017-02-09 DIAGNOSIS — G9389 Other specified disorders of brain: Secondary | ICD-10-CM | POA: Diagnosis present

## 2017-02-09 DIAGNOSIS — A5217 General paresis: Secondary | ICD-10-CM

## 2017-02-09 DIAGNOSIS — D496 Neoplasm of unspecified behavior of brain: Secondary | ICD-10-CM

## 2017-02-09 DIAGNOSIS — I4891 Unspecified atrial fibrillation: Secondary | ICD-10-CM

## 2017-02-09 DIAGNOSIS — G939 Disorder of brain, unspecified: Secondary | ICD-10-CM

## 2017-02-09 HISTORY — PX: APPLICATION OF CRANIAL NAVIGATION: SHX6578

## 2017-02-09 HISTORY — PX: PR DURAL GRAFT REPAIR,SPINE DEFECT: 63710

## 2017-02-09 LAB — TYPE AND SCREEN
ABO/RH(D): A POS
Antibody Screen: NEGATIVE

## 2017-02-09 LAB — MAGNESIUM: Magnesium: 1.9 mg/dL (ref 1.7–2.4)

## 2017-02-09 LAB — GLUCOSE, CAPILLARY
Glucose-Capillary: 118 mg/dL — ABNORMAL HIGH (ref 65–99)
Glucose-Capillary: 222 mg/dL — ABNORMAL HIGH (ref 65–99)

## 2017-02-09 LAB — CBC
HCT: 32.5 % — ABNORMAL LOW (ref 39.0–52.0)
Hemoglobin: 10.2 g/dL — ABNORMAL LOW (ref 13.0–17.0)
MCH: 28.3 pg (ref 26.0–34.0)
MCHC: 31.4 g/dL (ref 30.0–36.0)
MCV: 90.3 fL (ref 78.0–100.0)
PLATELETS: 100 10*3/uL — AB (ref 150–400)
RBC: 3.6 MIL/uL — ABNORMAL LOW (ref 4.22–5.81)
RDW: 14.4 % (ref 11.5–15.5)
WBC: 4.4 10*3/uL (ref 4.0–10.5)

## 2017-02-09 LAB — BASIC METABOLIC PANEL
Anion gap: 5 (ref 5–15)
BUN: 37 mg/dL — AB (ref 6–20)
CALCIUM: 8.8 mg/dL — AB (ref 8.9–10.3)
CO2: 23 mmol/L (ref 22–32)
CREATININE: 1.69 mg/dL — AB (ref 0.61–1.24)
Chloride: 110 mmol/L (ref 101–111)
GFR calc Af Amer: 45 mL/min — ABNORMAL LOW (ref >60)
GFR, EST NON AFRICAN AMERICAN: 38 mL/min — AB (ref >60)
GLUCOSE: 100 mg/dL — AB (ref 65–99)
POTASSIUM: 3.9 mmol/L (ref 3.5–5.1)
SODIUM: 138 mmol/L (ref 135–145)

## 2017-02-09 LAB — ABO/RH: ABO/RH(D): A POS

## 2017-02-09 LAB — SURGICAL PCR SCREEN
MRSA, PCR: NEGATIVE
Staphylococcus aureus: NEGATIVE

## 2017-02-09 LAB — PROTIME-INR
INR: 1.42
Prothrombin Time: 17.2 seconds — ABNORMAL HIGH (ref 11.4–15.2)

## 2017-02-09 SURGERY — FRAMELESS STEREOTACTIC BIOPSY
Anesthesia: General | Laterality: Right

## 2017-02-09 MED ORDER — BACITRACIN ZINC 500 UNIT/GM EX OINT
TOPICAL_OINTMENT | CUTANEOUS | Status: DC | PRN
  Administered 2017-02-09: 1 via TOPICAL

## 2017-02-09 MED ORDER — EPHEDRINE SULFATE-NACL 50-0.9 MG/10ML-% IV SOSY
PREFILLED_SYRINGE | INTRAVENOUS | Status: DC | PRN
  Administered 2017-02-09: 5 mg via INTRAVENOUS

## 2017-02-09 MED ORDER — MIDAZOLAM HCL 5 MG/5ML IJ SOLN
INTRAMUSCULAR | Status: DC | PRN
  Administered 2017-02-09: 0.5 mg via INTRAVENOUS

## 2017-02-09 MED ORDER — HEPARIN (PORCINE) IN NACL 100-0.45 UNIT/ML-% IJ SOLN
1100.0000 [IU]/h | INTRAMUSCULAR | Status: DC
  Filled 2017-02-09: qty 250

## 2017-02-09 MED ORDER — FENTANYL CITRATE (PF) 250 MCG/5ML IJ SOLN
INTRAMUSCULAR | Status: AC
  Filled 2017-02-09: qty 5

## 2017-02-09 MED ORDER — BUPIVACAINE-EPINEPHRINE (PF) 0.5% -1:200000 IJ SOLN
INTRAMUSCULAR | Status: DC | PRN
  Administered 2017-02-09: 3 mL via PERINEURAL

## 2017-02-09 MED ORDER — FENTANYL CITRATE (PF) 100 MCG/2ML IJ SOLN
INTRAMUSCULAR | Status: DC | PRN
  Administered 2017-02-09: 100 ug via INTRAVENOUS
  Administered 2017-02-09: 50 ug via INTRAVENOUS

## 2017-02-09 MED ORDER — FENTANYL CITRATE (PF) 100 MCG/2ML IJ SOLN: 25.0000 ug | INTRAMUSCULAR | Status: DC | PRN

## 2017-02-09 MED ORDER — BUPIVACAINE-EPINEPHRINE (PF) 0.5% -1:200000 IJ SOLN
INTRAMUSCULAR | Status: AC
  Filled 2017-02-09: qty 30

## 2017-02-09 MED ORDER — THROMBIN (RECOMBINANT) 5000 UNITS EX SOLR
CUTANEOUS | Status: DC | PRN
  Administered 2017-02-09: 5000 [IU] via TOPICAL

## 2017-02-09 MED ORDER — HYDRALAZINE HCL 20 MG/ML IJ SOLN
5.0000 mg | INTRAMUSCULAR | Status: DC | PRN
  Administered 2017-02-12: 5 mg via INTRAVENOUS
  Filled 2017-02-09: qty 1

## 2017-02-09 MED ORDER — HYDROCODONE-ACETAMINOPHEN 5-325 MG PO TABS
1.0000 | ORAL_TABLET | ORAL | Status: DC | PRN
  Administered 2017-02-09 – 2017-02-13 (×3): 1 via ORAL
  Filled 2017-02-09 (×3): qty 1

## 2017-02-09 MED ORDER — SODIUM CHLORIDE 0.9 % IV SOLN
INTRAVENOUS | Status: DC | PRN
  Administered 2017-02-09 (×3): via INTRAVENOUS

## 2017-02-09 MED ORDER — SENNA 8.6 MG PO TABS
1.0000 | ORAL_TABLET | Freq: Two times a day (BID) | ORAL | Status: DC
  Administered 2017-02-09 – 2017-02-10 (×3): 8.6 mg via ORAL
  Filled 2017-02-09 (×3): qty 1

## 2017-02-09 MED ORDER — HEMOSTATIC AGENTS (NO CHARGE) OPTIME
TOPICAL | Status: DC | PRN
  Administered 2017-02-09: 1 via TOPICAL

## 2017-02-09 MED ORDER — 0.9 % SODIUM CHLORIDE (POUR BTL) OPTIME
TOPICAL | Status: DC | PRN
  Administered 2017-02-09: 1000 mL

## 2017-02-09 MED ORDER — PHENYLEPHRINE HCL 10 MG/ML IJ SOLN
INTRAMUSCULAR | Status: DC | PRN
  Administered 2017-02-09: 25 ug/min via INTRAVENOUS

## 2017-02-09 MED ORDER — THROMBIN (RECOMBINANT) 5000 UNITS EX SOLR
CUTANEOUS | Status: AC
  Filled 2017-02-09: qty 5000

## 2017-02-09 MED ORDER — SODIUM CHLORIDE 0.9 % IV SOLN
INTRAVENOUS | Status: DC
  Administered 2017-02-09 – 2017-02-11 (×4): via INTRAVENOUS

## 2017-02-09 MED ORDER — MIDAZOLAM HCL 2 MG/2ML IJ SOLN
INTRAMUSCULAR | Status: AC
  Filled 2017-02-09: qty 2

## 2017-02-09 MED ORDER — FLEET ENEMA 7-19 GM/118ML RE ENEM: 1.0000 | ENEMA | Freq: Once | RECTAL | Status: DC | PRN

## 2017-02-09 MED ORDER — ONDANSETRON HCL 4 MG PO TABS
4.0000 mg | ORAL_TABLET | ORAL | Status: DC | PRN
  Administered 2017-02-11 – 2017-02-15 (×2): 4 mg via ORAL
  Filled 2017-02-09 (×2): qty 1

## 2017-02-09 MED ORDER — ONDANSETRON HCL 4 MG/2ML IJ SOLN
4.0000 mg | INTRAMUSCULAR | Status: DC | PRN
  Administered 2017-02-09 – 2017-02-14 (×6): 4 mg via INTRAVENOUS
  Filled 2017-02-09 (×6): qty 2

## 2017-02-09 MED ORDER — ROCURONIUM BROMIDE 100 MG/10ML IV SOLN
INTRAVENOUS | Status: DC | PRN
  Administered 2017-02-09: 50 mg via INTRAVENOUS

## 2017-02-09 MED ORDER — PROMETHAZINE HCL 25 MG PO TABS
12.5000 mg | ORAL_TABLET | ORAL | Status: DC | PRN
  Administered 2017-02-12: 25 mg via ORAL
  Filled 2017-02-09: qty 1

## 2017-02-09 MED ORDER — LIDOCAINE-EPINEPHRINE 2 %-1:100000 IJ SOLN
INTRAMUSCULAR | Status: DC | PRN
  Administered 2017-02-09: 3 mL via INTRADERMAL

## 2017-02-09 MED ORDER — CEFAZOLIN SODIUM-DEXTROSE 2-4 GM/100ML-% IV SOLN
2.0000 g | Freq: Three times a day (TID) | INTRAVENOUS | Status: AC
  Administered 2017-02-09 – 2017-02-10 (×2): 2 g via INTRAVENOUS
  Filled 2017-02-09 (×2): qty 100

## 2017-02-09 MED ORDER — BISACODYL 5 MG PO TBEC
5.0000 mg | DELAYED_RELEASE_TABLET | Freq: Every day | ORAL | Status: DC | PRN
  Administered 2017-02-14: 5 mg via ORAL
  Filled 2017-02-09: qty 1

## 2017-02-09 MED ORDER — DEXAMETHASONE SODIUM PHOSPHATE 10 MG/ML IJ SOLN
INTRAMUSCULAR | Status: DC | PRN
  Administered 2017-02-09: 10 mg via INTRAVENOUS

## 2017-02-09 MED ORDER — BACITRACIN ZINC 500 UNIT/GM EX OINT
TOPICAL_OINTMENT | CUTANEOUS | Status: AC
  Filled 2017-02-09: qty 28.35

## 2017-02-09 MED ORDER — LIDOCAINE HCL (CARDIAC) 20 MG/ML IV SOLN
INTRAVENOUS | Status: DC | PRN
  Administered 2017-02-09: 100 mg via INTRAVENOUS

## 2017-02-09 MED ORDER — SUGAMMADEX SODIUM 200 MG/2ML IV SOLN
INTRAVENOUS | Status: DC | PRN
  Administered 2017-02-09: 132 mg via INTRAVENOUS

## 2017-02-09 MED ORDER — ONDANSETRON HCL 4 MG/2ML IJ SOLN
INTRAMUSCULAR | Status: DC | PRN
  Administered 2017-02-09: 4 mg via INTRAVENOUS

## 2017-02-09 MED ORDER — LIDOCAINE-EPINEPHRINE 2 %-1:100000 IJ SOLN
INTRAMUSCULAR | Status: AC
  Filled 2017-02-09: qty 1

## 2017-02-09 MED ORDER — DOCUSATE SODIUM 100 MG PO CAPS
100.0000 mg | ORAL_CAPSULE | Freq: Two times a day (BID) | ORAL | Status: DC
  Administered 2017-02-09 – 2017-02-16 (×15): 100 mg via ORAL
  Filled 2017-02-09 (×16): qty 1

## 2017-02-09 MED ORDER — PANTOPRAZOLE SODIUM 40 MG IV SOLR
40.0000 mg | Freq: Every day | INTRAVENOUS | Status: DC
  Administered 2017-02-09: 40 mg via INTRAVENOUS
  Filled 2017-02-09: qty 40

## 2017-02-09 MED ORDER — PROPOFOL 10 MG/ML IV BOLUS
INTRAVENOUS | Status: DC | PRN
  Administered 2017-02-09: 130 mg via INTRAVENOUS

## 2017-02-09 MED ORDER — ONDANSETRON HCL 4 MG/2ML IJ SOLN: 4.0000 mg | Freq: Once | INTRAMUSCULAR | Status: DC | PRN

## 2017-02-09 MED ORDER — NALOXONE HCL 0.4 MG/ML IJ SOLN: 0.0800 mg | INTRAMUSCULAR | Status: DC | PRN

## 2017-02-09 SURGICAL SUPPLY — 67 items
BANDAGE ADH SHEER 1  50/CT (GAUZE/BANDAGES/DRESSINGS) IMPLANT
BLADE CLIPPER SURG (BLADE) IMPLANT
BLADE SURG 10 STRL SS (BLADE) ×4 IMPLANT
BLADE SURG 11 STRL SS (BLADE) ×4 IMPLANT
BUR ACORN 6.0 (BURR) ×3 IMPLANT
BUR ACORN 6.0MM (BURR) ×1
CABLE BIPOLOR RESECTION CORD (MISCELLANEOUS) IMPLANT
CANISTER SUCT 3000ML PPV (MISCELLANEOUS) ×4 IMPLANT
CARTRIDGE OIL MAESTRO DRILL (MISCELLANEOUS) ×4 IMPLANT
CHLORAPREP W/TINT 26ML (MISCELLANEOUS) IMPLANT
COVER MAYO STAND STRL (DRAPES) IMPLANT
DECANTER SPIKE VIAL GLASS SM (MISCELLANEOUS) ×4 IMPLANT
DIFFUSER DRILL AIR PNEUMATIC (MISCELLANEOUS) ×8 IMPLANT
DRAPE INCISE IOBAN 66X45 STRL (DRAPES) IMPLANT
DRAPE POUCH INSTRU U-SHP 10X18 (DRAPES) ×4 IMPLANT
DRAPE SHEET LG 3/4 BI-LAMINATE (DRAPES) ×8 IMPLANT
DRSG TELFA 3X8 NADH (GAUZE/BANDAGES/DRESSINGS) ×4 IMPLANT
ELECT CAUTERY BLADE 6.4 (BLADE) ×4 IMPLANT
ELECT COATED BLADE 2.86 ST (ELECTRODE) IMPLANT
ELECT REM PT RETURN 9FT ADLT (ELECTROSURGICAL) ×4
ELECTRODE REM PT RTRN 9FT ADLT (ELECTROSURGICAL) ×2 IMPLANT
GAUZE SPONGE 4X4 16PLY XRAY LF (GAUZE/BANDAGES/DRESSINGS) ×4 IMPLANT
GLOVE BIO SURGEON STRL SZ7 (GLOVE) IMPLANT
GLOVE BIOGEL PI IND STRL 7.0 (GLOVE) ×2 IMPLANT
GLOVE BIOGEL PI IND STRL 7.5 (GLOVE) ×2 IMPLANT
GLOVE BIOGEL PI INDICATOR 7.0 (GLOVE) ×2
GLOVE BIOGEL PI INDICATOR 7.5 (GLOVE) ×2
GLOVE EXAM NITRILE LRG STRL (GLOVE) IMPLANT
GLOVE EXAM NITRILE XL STR (GLOVE) IMPLANT
GLOVE EXAM NITRILE XS STR PU (GLOVE) IMPLANT
GLOVE SS BIOGEL STRL SZ 7.5 (GLOVE) ×6 IMPLANT
GLOVE SUPERSENSE BIOGEL SZ 7.5 (GLOVE) ×6
GOWN STRL REUS W/ TWL LRG LVL3 (GOWN DISPOSABLE) ×4 IMPLANT
GOWN STRL REUS W/ TWL XL LVL3 (GOWN DISPOSABLE) IMPLANT
GOWN STRL REUS W/TWL 2XL LVL3 (GOWN DISPOSABLE) IMPLANT
GOWN STRL REUS W/TWL LRG LVL3 (GOWN DISPOSABLE) ×4
GOWN STRL REUS W/TWL XL LVL3 (GOWN DISPOSABLE)
HEMOSTAT POWDER SURGIFOAM 1G (HEMOSTASIS) ×4 IMPLANT
KIT BASIN OR (CUSTOM PROCEDURE TRAY) ×4 IMPLANT
KIT ROOM TURNOVER OR (KITS) ×4 IMPLANT
MARKER SPHERE PSV REFLC 13MM (MARKER) ×12 IMPLANT
NEEDLE BIOPSY DISPOS 1.8X150MM (NEEDLE) ×4 IMPLANT
NEEDLE HYPO 21X1.5 SAFETY (NEEDLE) ×4 IMPLANT
NEEDLE HYPO 25X1 1.5 SAFETY (NEEDLE) ×4 IMPLANT
NS IRRIG 1000ML POUR BTL (IV SOLUTION) ×4 IMPLANT
OIL CARTRIDGE MAESTRO DRILL (MISCELLANEOUS) ×8
PACK CRANIOTOMY (CUSTOM PROCEDURE TRAY) ×4 IMPLANT
PACK EENT II TURBAN DRAPE (CUSTOM PROCEDURE TRAY) IMPLANT
PAD ARMBOARD 7.5X6 YLW CONV (MISCELLANEOUS) ×12 IMPLANT
PATTIES SURGICAL .5 X.5 (GAUZE/BANDAGES/DRESSINGS) IMPLANT
PENCIL BUTTON HOLSTER BLD 10FT (ELECTRODE) IMPLANT
SPONGE SURGIFOAM ABS GEL SZ50 (HEMOSTASIS) ×4 IMPLANT
STAPLER SKIN PROX WIDE 3.9 (STAPLE) IMPLANT
STAPLER VISISTAT 35W (STAPLE) ×4 IMPLANT
SUT BONE WAX W31G (SUTURE) IMPLANT
SUT VIC AB 2-0 CP2 18 (SUTURE) ×4 IMPLANT
SUT VIC AB 2-0 CT1 18 (SUTURE) ×4 IMPLANT
SUT VICRYL RAPIDE 3/0 (SUTURE) ×4 IMPLANT
SYR 30ML LL (SYRINGE) ×8 IMPLANT
SYR 5ML LUER SLIP (SYRINGE) IMPLANT
SYR BULB 3OZ (MISCELLANEOUS) ×4 IMPLANT
SYR CONTROL 10ML LL (SYRINGE) IMPLANT
TOWEL GREEN STERILE (TOWEL DISPOSABLE) ×4 IMPLANT
TOWEL GREEN STERILE FF (TOWEL DISPOSABLE) ×4 IMPLANT
TUBE CONNECTING 12'X1/4 (SUCTIONS) ×1
TUBE CONNECTING 12X1/4 (SUCTIONS) ×3 IMPLANT
WATER STERILE IRR 1000ML POUR (IV SOLUTION) ×4 IMPLANT

## 2017-02-09 NOTE — Anesthesia Preprocedure Evaluation (Addendum)
Anesthesia Evaluation  Patient identified by MRN, date of birth, ID band Patient awake    Reviewed: Allergy & Precautions, NPO status , Patient's Chart, lab work & pertinent test results  History of Anesthesia Complications Negative for: history of anesthetic complications  Airway Mallampati: II  TM Distance: >3 FB Neck ROM: Full    Dental  (+) Dental Advisory Given   Pulmonary former smoker,    Pulmonary exam normal breath sounds clear to auscultation       Cardiovascular hypertension, + CAD  Normal cardiovascular exam+ dysrhythmias Atrial Fibrillation  Rhythm:Regular Rate:Normal  EKG - SB with RBBB   Neuro/Psych Facial droop  Brain MRI - Heterogeneously enhancing mass lesion in the anterior inferior frontal lobes crosses midline. The tumor measures 5.3 by 3.6 x 4.0cm. This lesion may be extra-axial. 2. Similar enhancing lesions with restricted diffusion are noted in the fourth ventricle and involving the right side of the inferior cerebellar vermis. 3. 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 Metastatic disease is considered less likely. The diffusion-weighted images do not show the typical level of restricted diffusion seen with lymphoma. Lumbar puncture may be useful for diagnosis given the likelihood of CSF tumor spread. MRI of the lumbar spine without and with contrast could also be utilized to evaluate for distal tumor spread. 4. Diffuse subcortical white matter changes bilaterally likely reflect the sequela of chronic microvascular ischemia. negative psych ROS   GI/Hepatic negative GI ROS, Neg liver ROS,   Endo/Other  negative endocrine ROS  Renal/GU ARFRenal diseaseS/p renal transplant  negative genitourinary   Musculoskeletal  (+) Arthritis ,   Abdominal   Peds  Hematology  (+) anemia , Thrombocytopenia   Anesthesia Other Findings   Reproductive/Obstetrics                           Anesthesia Physical Anesthesia Plan  ASA: III  Anesthesia Plan: General   Post-op Pain Management:    Induction: Intravenous  PONV Risk Score and Plan: 3 and Treatment may vary due to age or medical condition, Dexamethasone and Ondansetron  Airway Management Planned: Oral ETT  Additional Equipment: Arterial line  Intra-op Plan:   Post-operative Plan: Extubation in OR  Informed Consent: I have reviewed the patients History and Physical, chart, labs and discussed the procedure including the risks, benefits and alternatives for the proposed anesthesia with the patient or authorized representative who has indicated his/her understanding and acceptance.   Dental advisory given  Plan Discussed with: CRNA and Anesthesiologist  Anesthesia Plan Comments: (2 large bore IV's. If unable to obtain, will place central line)       Anesthesia Quick Evaluation

## 2017-02-09 NOTE — Anesthesia Procedure Notes (Signed)
Procedure Name: Intubation Date/Time: 02/09/2017 11:59 AM Performed by: Lowella Dell, CRNA Pre-anesthesia Checklist: Patient identified, Emergency Drugs available, Suction available and Patient being monitored Patient Re-evaluated:Patient Re-evaluated prior to induction Oxygen Delivery Method: Circle System Utilized Preoxygenation: Pre-oxygenation with 100% oxygen Induction Type: IV induction Ventilation: Mask ventilation without difficulty Laryngoscope Size: Mac and 4 Grade View: Grade I Tube type: Oral Tube size: 7.5 mm Number of attempts: 1 Airway Equipment and Method: Stylet Placement Confirmation: ETT inserted through vocal cords under direct vision,  positive ETCO2 and breath sounds checked- equal and bilateral Secured at: 21 cm Tube secured with: Tape Dental Injury: Teeth and Oropharynx as per pre-operative assessment

## 2017-02-09 NOTE — Progress Notes (Signed)
ANTICOAGULATION CONSULT NOTE  Pharmacy Consult for heparin Indication: atrial fibrillation  No Known Allergies  Patient Measurements: Height: 5\' 6"  (167.6 cm) Weight: 150 lb 5.7 oz (68.2 kg) IBW/kg (Calculated) : 63.8  Heparin dosing weight: 66 kg   Vital Signs: Temp: 97.8 F (36.6 C) (11/08 1500) Temp Source: Oral (11/08 1500) BP: 131/71 (11/08 1600) Pulse Rate: 62 (11/08 1600)  Labs: Recent Labs    02/07/17 0534 02/08/17 0444 02/08/17 1256 02/08/17 2342  HGB 9.6* 9.8*  --  10.2*  HCT 30.8* 31.3*  --  32.5*  PLT 99* 101*  --  100*  LABPROT 22.4* 19.4*  --  17.2*  INR 1.99 1.65  --  1.42  HEPARINUNFRC  --  0.22* 0.42  --   CREATININE  --  1.79*  --  1.69*    Assessment: 71 year old male seen in ED with facial drop- has been worsening over last several days to 1 week. Stroke ruled out. Imaging shows mass in frontal lobe and lesions in 4th ventricle.   Warfarin for Afib is on hold. Patient was transitioned to heparin infusion which was stopped for the biopsy today. Spoke with Dr. Posey Pronto and will resume heparin drip 24 hrs post procedure per Dr. Hewitt Shorts post-op note.   Goal of Therapy:  Heparin level 0.3-0.7 units/ml Monitor platelets by anticoagulation protocol: Yes   Plan:  Resume heparin drip at 1100 units/hr on 11/9 at 14:00 Daily heparin level and CBC Monitor for s/sx of bleeding F/U resuming warfarin   Renold Genta, PharmD, BCPS Clinical Pharmacist Phone for today - [place] - [phone] 02/09/2017 4:12 PM

## 2017-02-09 NOTE — Consult Note (Signed)
[facility] Consult Note  Patient Care Team: System, Pcp Not In as PCP - General  CHIEF COMPLAINTS/PURPOSE OF CONSULTATION:  Facial Droop, Brain Mass  HISTORY OF PRESENTING ILLNESS:  Jeffrey Perez 71 y.o. male presented after noticing drooping/weakening of the left side of his face.  He had been admitted ~2 weeks ago for AKI and generalized weakness, but this deficit was not noted at that time.  Subsequent CNS imaging demonstrated large enhancing mass in the mesial frontal lobe, extending into both hemispheres, as well as a separate nodule of enhancement in the posterior fossa.  He underwent diagnostic stereotactic biopsy this morning with Dr. 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  He tolerated surgery well without any complication.  He does complain today of difficulty with vision on the left, although this complaint also preceded surgery.    Of note, he is a renal transplant patient (tx was 25 years prior) on chronic immunosuppression.  He also has diagnosis of Afib currently on coumadin.    MEDICAL HISTORY:  Past Medical History:  Diagnosis Date  . Acute kidney injury ([place]) 01/25/2017   Archie Endo 01/25/2017  . Anemia   . Arthritis    'all over my bones" (01/26/2017)  . Coronary artery disease   . ESRD (end stage renal disease) on dialysis [facility]) 1992-1994   "stopped when I got the transplant"  . Gout   . History of blood transfusion    "very many times" (01/26/2017)  . Hypertension     SURGICAL HISTORY: Past Surgical History:  Procedure Laterality Date  . APPENDECTOMY  1970s  . AV FISTULA PLACEMENT Left 1992   "not working anymore" (01/26/2017)  . CATARACT EXTRACTION W/ INTRAOCULAR LENS  IMPLANT, BILATERAL Bilateral   . COLONOSCOPY  ~ 2016  . CORONARY ANGIOPLASTY WITH STENT PLACEMENT  ~ 2003  . INGUINAL HERNIA REPAIR Left 2000s  . KIDNEY TRANSPLANT Left 1994    SOCIAL HISTORY: Social History   Socioeconomic History  . Marital  status: Divorced    Spouse name: Not on file  . Number of children: Not on file  . Years of education: Not on file  . Highest education level: Not on file  Social Needs  . Financial resource strain: Somewhat hard  . Food insecurity - worry: Patient refused  . Food insecurity - inability: Patient refused  . Transportation needs - medical: Patient refused  . Transportation needs - non-medical: Patient refused  Occupational History  . Not on file  Tobacco Use  . Smoking status: Former Smoker    Years: 5.00    Types: Cigarettes    Last attempt to quit: 1965    Years since quitting: 53.8  . Smokeless tobacco: Never Used  Substance and Sexual Activity  . Alcohol use: No  . Drug use: No  . Sexual activity: No  Other Topics Concern  . Not on file  Social History Narrative  . Not on file    FAMILY HISTORY: History reviewed. No pertinent family history.  ALLERGIES:  has No Known Allergies.  MEDICATIONS:  Current Facility-Administered Medications  Medication Dose Route Frequency Provider Last Rate Last Dose  . 0.9 %  sodium chloride infusion  250 mL Intravenous PRN Cristal Ford, DO 10 mL/hr at 02/09/17 0439 250 mL at 02/09/17 0439  . 0.9 %  sodium chloride infusion   Intravenous Continuous Ditty, Kevan Ny, MD 100 mL/hr at 02/09/17 1500    . acetaminophen (TYLENOL) tablet 650 mg  650 mg  Oral Q6H PRN Cristal Ford, DO       Or  . acetaminophen (TYLENOL) suppository 650 mg  650 mg Rectal Q6H PRN Cristal Ford, DO      . bisacodyl (DULCOLAX) EC tablet 5 mg  5 mg Oral Daily PRN Ditty, Kevan Ny, MD      . ceFAZolin (ANCEF) IVPB 2g/100 mL premix  2 g Intravenous Q8H Ditty, Kevan Ny, MD      . cholecalciferol (VITAMIN D) tablet 1,000 Units  1,000 Units Oral Daily Cristal Ford, DO   1,000 Units at 02/08/17 0910  . docusate sodium (COLACE) capsule 100 mg  100 mg Oral BID Ditty, Kevan Ny, MD      . doxazosin (CARDURA) tablet 8 mg  8 mg Oral BID Cristal Ford, DO   8 mg at 02/08/17 2219  . hydrALAZINE (APRESOLINE) injection 5-10 mg  5-10 mg Intravenous Q1H PRN Ditty, Kevan Ny, MD      . HYDROcodone-acetaminophen (NORCO/VICODIN) 5-325 MG per tablet 1 tablet  1 tablet Oral Q4H PRN Ditty, Kevan Ny, MD      . mycophenolate (MYFORTIC) EC tablet 360 mg  360 mg Oral BID Cristal Ford, DO   360 mg at 02/08/17 2218  . naloxone [facility]) injection 0.08 mg  0.08 mg Intravenous PRN Ditty, Kevan Ny, MD      . naphazoline-glycerin (CLEAR EYES) ophth solution 1-2 drop  1-2 drop Left Eye QID PRN Cristal Ford, DO   1 drop at 02/07/17 2114  . ondansetron (ZOFRAN) tablet 4 mg  4 mg Oral Q4H PRN Ditty, Kevan Ny, MD       Or  . ondansetron [facility]) injection 4 mg  4 mg Intravenous Q4H PRN Ditty, Kevan Ny, MD      . pantoprazole (PROTONIX) injection 40 mg  40 mg Intravenous QHS Ditty, Kevan Ny, MD      . pneumococcal 23 valent vaccine (PNU-IMMUNE) injection 0.5 mL  0.5 mL Intramuscular Tomorrow-1000 Cristal Ford, DO   Stopped at 02/07/17 0031  . predniSONE (DELTASONE) tablet 5 mg  5 mg Oral Q breakfast Cristal Ford, DO   5 mg at 02/08/17 9935  . promethazine (PHENERGAN) tablet 12.5-25 mg  12.5-25 mg Oral Q4H PRN Ditty, Kevan Ny, MD      . senna [facility]) tablet 8.6 mg  1 tablet Oral BID Ditty, Kevan Ny, MD      . sodium chloride flush (NS) 0.9 % injection 3 mL  3 mL Intravenous Q12H Mikhail, Worth, DO   3 mL at 02/08/17 0911  . sodium chloride flush (NS) 0.9 % injection 3 mL  3 mL Intravenous PRN Cristal Ford, DO      . sodium phosphate (FLEET) 7-19 GM/118ML enema 1 enema  1 enema Rectal Once PRN Ditty, Kevan Ny, MD      . tacrolimus (PROGRAF) capsule 1 mg  1 mg Oral BID Cristal Ford, DO   1 mg at 02/08/17 2218    REVIEW OF SYSTEMS:   Constitutional: Denies fevers, chills or abnormal weight loss Eyes: Denies blurriness of vision Ears, nose, mouth, throat, and face: Denies mucositis or  sore throat Respiratory: Denies cough, dyspnea or wheezes Cardiovascular: Denies palpitation, chest discomfort or lower extremity swelling Gastrointestinal:  Denies nausea, constipation, diarrhea GU: Denies dysuria or incontinence Skin: Denies abnormal skin rashes Neurological: Per HPI Musculoskeletal: Denies joint pain, back or neck discomfort. No decrease in ROM Behavioral/Psych: Denies anxiety, disturbance in thought content, and mood instability   PHYSICAL EXAMINATION: Vitals:  02/09/17 1415 02/09/17 1500  BP: 107/65 94/78  Pulse: 74 66  Resp: 16 18  Temp:  97.8 F (36.6 C)  SpO2: 97% 98%   KPS: 60. General: Alert, cooperative, pleasant, Head: Craniotomy scar noted, dry and intact. EENT: No conjunctival injection or scleral icterus. Oral mucosa moist Lungs: Resp effort normal Cardiac: Regular rate and rhythm Abdomen: Soft, non-distended abdomen Skin: No rashes cyanosis or petechiae. Extremities: Gout noted on extremities esp elbow.  Residual soft tissue distortion from prior dialysis.  NEUROLOGIC EXAM: Mental Status: Awake, alert, attentive to examiner. Oriented to self and environment. Language is fluent with intact comprehension.  Limited insight, elements of apathy, poor insight into gravity of disease.  Cranial Nerves: Visual acuity is grossly normal. Visual fields are full. Noted dense left gaze paresis with course end-gaze nystagmus on right gaze. Left lower motor neuron facial paresis.  Otherwise pupils reactive, palate symmetric.  Motor: Tone and bulk are normal. Power is full in both arms and legs. Reflexes are symmetric, no pathologic reflexes present. Intact finger to nose bilaterally Sensory: Intact to light touch and temperature Gait: Deferred  LABORATORY DATA:  I have reviewed the data as listed Lab Results  Component Value Date   WBC 4.4 02/08/2017   HGB 10.2 (L) 02/08/2017   HCT 32.5 (L) 02/08/2017   MCV 90.3 02/08/2017   PLT 100 (L) 02/08/2017    Recent Labs    01/27/17 0221  01/28/17 0443 02/04/17 1507  02/06/17 0250 02/08/17 0444 02/08/17 2342  NA 139   < > 139 140   < > 138 138 138  K 4.1   < > 3.8 3.8   < > 4.1 3.7 3.9  CL 108   < > 108 109   < > 106 109 110  CO2 22   < > 21* 23   < > 24 21* 23  GLUCOSE 97   < > 85 124*   < > 89 86 100*  BUN 44*   < > 42* 41*   < > 35* 41* 37*  CREATININE 1.96*   < > 1.75* 1.70*   < > 1.70* 1.79* 1.69*  CALCIUM 9.9   < > 9.4 9.7   < > 8.8* 8.8* 8.8*  GFRNONAA 32*   < > 37* 38*   < > 38* 36* 38*  GFRAA 37*   < > 43* 44*   < > 44* 42* 45*  PROT 5.6*  --  5.3* 6.4*  --   --   --   --   ALBUMIN 3.2*  --  3.0* 3.7  --   --   --   --   AST 18  --  18 21  --   --   --   --   ALT 15*  --  15* 17  --   --   --   --   ALKPHOS 49  --  45 58  --   --   --   --   BILITOT 0.7  --  0.5 0.7  --   --   --   --    < > = values in this interval not displayed.    RADIOGRAPHIC STUDIES:  Dg Chest 2 View  Result Date: 01/25/2017 CLINICAL DATA:  Fatigue and weakness EXAM: CHEST  2 VIEW COMPARISON:  None. FINDINGS: Post sternotomy changes. Linear atelectasis or scarring at both lung bases. No acute consolidation or effusion. Mild cardiomegaly. Ectatic tortuous aorta with  atherosclerosis. No pneumothorax. Right paratracheal opacity. IMPRESSION: 1. Linear scarring or atelectasis at the bases 2. Mild cardiomegaly. Borderline to slightly enlarged mediastinum. In addition there is a vague right paratracheal opacity ; CT suggested for further evaluation. Electronically Signed   By: Donavan Foil M.D.   On: 01/25/2017 20:30   Ct Head Wo Contrast  Result Date: 02/07/2017 CLINICAL DATA:  71 y/o  M; brain mass. EXAM: CT HEAD WITHOUT CONTRAST TECHNIQUE: Contiguous axial images were obtained from the base of the skull through the vertex without intravenous contrast. COMPARISON:  02/04/2017 CT head and MRI head. FINDINGS: Brain: Small masses extending into the lower fourth ventricle along posterior brainstem and right  anterior vermis (series 3, image 52). Mass involving medial frontal lobes crossing splenium of corpus callosum surrounding edema. Mass effect effaces frontal horns of lateral ventricles. No acute intracranial hemorrhage or herniation. Vascular: No hyperdense vessel or unexpected calcification. Skull: Normal. Negative for fracture or focal lesion. Sinuses/Orbits: No acute finding. Other: None. IMPRESSION: 1. Large mass involving medial frontal lobes and genu of corpus callosum best delineated on postcontrast MRI brain. Mass effect with effacement of frontal horns of lateral ventricles. 2. Two additional small masses within the margins of lower fourth ventricle. 3. No acute intracranial abnormality. Electronically Signed   By: Kristine Garbe M.D.   On: 02/07/2017 14:33   Ct Chest Wo Contrast  Result Date: 01/25/2017 CLINICAL DATA:  Fatigue and weakness for the past 1.5 weeks. Right paratracheal opacity on chest radiographs earlier today. EXAM: CT CHEST WITHOUT CONTRAST TECHNIQUE: Multidetector CT imaging of the chest was performed following the standard protocol without IV contrast. COMPARISON:  Chest radiographs obtained earlier today. FINDINGS: Cardiovascular: Dense atheromatous calcifications, including extensive coronary artery calcifications as well as aortic calcifications. Borderline enlarged heart. Dense mitral valve annulus calcifications. Tortuous aorta and innominate artery, corresponding to the right paratracheal density seen on the chest radiographs earlier today. No mass is demonstrated at that location. Mediastinum/Nodes: No enlarged lymph nodes. Multiple tiny bilateral thyroid gland nodules. The largest is on the left, measuring 4 mm in maximum diameter. Lungs/Pleura: Small right lower lobe calcified granuloma. Minimal diffuse bilateral bullous changes. Bilateral linear atelectasis or scarring. Upper Abdomen: Dense atheromatous arterial calcifications. Severely atrophied right kidney.  The left kidney is not included. Musculoskeletal: Thoracic and lower cervical spine degenerative changes. IMPRESSION: 1. The right paratracheal density seen earlier today is a tortuous innominate artery. 2. Extensive dense calcific coronary artery and aortic atherosclerosis. 3. Minimal changes of COPD with centrilobular emphysema. 4. Sub-centimeter thyroid nodule(s) noted, too small to characterize, but most likely benign in the absence of known clinical risk factors for thyroid carcinoma. 5. Severely atrophied right kidney. The left kidney is not included or absent. Aortic Atherosclerosis (ICD10-I70.0) and Emphysema (ICD10-J43.9). Electronically Signed   By: Claudie Revering M.D.   On: 01/25/2017 23:07   Mr Jeri Cos And Wo Contrast  Result Date: 02/04/2017 CLINICAL DATA:  Focal neuro deficit for greater than 6 hours. Abnormal CT of the head. Left facial droop and abnormal gait. Unknown time of onset. EXAM: MRI HEAD WITHOUT AND WITH CONTRAST TECHNIQUE: Multiplanar, multiecho pulse sequences of the brain and surrounding structures were obtained without and with intravenous contrast. CONTRAST:  <See Chart> MULTIHANCE GADOBENATE DIMEGLUMINE 529 MG/ML IV SOLN COMPARISON:  CT head without contrast from the same day. FINDINGS: Brain: Enhancing mass lesion centered in the inferior right frontal lobe across seen midline. There is involvement of the anterior cingulate gyrus as well as the  rostrum any anterior genu of the corpus callosum. Anterior cerebral arteries extend through the lesion. The is slightly hyperintense on T2 weighted images with extensive heterogeneity. There is diffuse enhancement. The lesion measures 5.3 x 4.0 x 3.6 cm. The lesion involves the rostrum any anterior genu of the corpus callosum. The lesion appears to be centered inferior to the ventricles but extends superiorly in the frontal lobes, right greater than left. Surrounding vasogenic edema is confirmed. While there is some suggestion of CSF cleft  along the superior aspect of the tumor, the lesion is inseparable from the corpus callosum on the postcontrast images. Mild restricted diffusion is present within the lesion. A 0.5 x 0.7 x 1.2 cm lesion is present along the posterior aspect brainstem and ventral surface of the fourth ventricle. There is involvement of the right inferior vermis extending posteriorly. No other discrete foci of enhancement are present. There is no leptomeningeal enhancement. Moderate bilateral subcortical T2 signal changes are present bilaterally. 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 Vascular: Flow is present in the major intracranial arteries. Skull and upper cervical spine: The skullbase is within normal limits. The craniocervical junction is normal. There is fusion across the C3-4 disc space. Marked degenerative changes are present at C4-5 and C5-6. Marrow signal is otherwise normal for age. Sinuses/Orbits: Mild mucosal thickening is present in the inferior maxillary sinuses and scattered throughout the ethmoid air cells. No significant fluid levels are present. Mastoid air cells are clear. Bilateral lens replacements are present. The globes and orbits are otherwise within normal limits. IMPRESSION: 1. Heterogeneously enhancing mass lesion in the anterior inferior frontal lobes crosses midline. The tumor measures 5.3 by 3.6 x 4.0 cm. This lesion may be extra-axial. 2. Similar enhancing lesions with restricted diffusion are noted in the fourth ventricle and involving the right side of the inferior cerebellar vermis. 3. 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 Metastatic disease is considered less likely. The diffusion-weighted images do not show the typical level of restricted diffusion seen with lymphoma. Lumbar puncture may be useful for diagnosis given the likelihood of CSF tumor spread. MRI of the lumbar spine without and with  contrast could also be utilized to evaluate for distal tumor spread. 4. Diffuse subcortical white matter changes bilaterally likely reflect the sequela of chronic microvascular ischemia. These results were called by telephone at the time of interpretation on 02/04/2017 at 7:16 pm to Dr. Rory Percy, who verbally acknowledged these results. Electronically Signed   By: San Morelle M.D.   On: 02/04/2017 19:17   US Renal  Result Date: 01/26/2017 CLINICAL DATA:  Acute kidney injury EXAM: RENAL / URINARY TRACT ULTRASOUND COMPLETE COMPARISON:  Overlapping portions of CT chest 01/25/2017 FINDINGS: Right Kidney: Length: 6.5 cm. Severe cortical thinning. Echogenic cortex. No obvious mass but the kidney is only indistinctly seen and the cortex seems to have similar echogenicity to the surrounding renal sinus and perirenal adipose tissue. No definite stones. No hydronephrosis. Left Kidney: Length: 6.3 cm. Severely atrophic and echogenic. No obvious stones or mass. Pelvic transplant kidney: Length: 10.4 cm. No hydronephrosis, calculi, or discrete mass. There is a 1.8 by 1.2 by 1.7 cm simple appearing cyst in the transplant left kidney. Bladder: Mild diffuse wall irregularity probably due to nondistention. Prostate gland measured at 4.2 by 4.0 by 4.1 cm (volume = 36 cm^3). IMPRESSION: 1. No hydronephrosis or discrete abnormality of the pelvic transplant kidney aside from  a 1.8 cm in long axis simple appearing cyst. 2. The native kidneys are severely atrophic and echogenic. 3. There is slight luminal irregularity in the urinary bladder diffusely, favoring nondistention over cystitis, correlate with urine analysis. Electronically Signed   By: Van Clines M.D.   On: 01/26/2017 11:48   Ct Head Code Stroke Wo Contrast  Result Date: 02/04/2017 CLINICAL DATA:  Code stroke. 71 year old male with left facial droop and abnormal gait. Unknown time of onset. EXAM: CT HEAD WITHOUT CONTRAST TECHNIQUE: Contiguous axial images  were obtained from the base of the skull through the vertex without intravenous contrast. COMPARISON:  None. FINDINGS: Brain: There is architectural distortion along the anterior corpus callosum, such that no normal genu or rostrum are identified. There is suggestion posterior mass effect on the right frontal horn on series 3, image 15. There is associated abnormal hypodensity in both anterior inferior frontal gyri which more resembles vasogenic edema (instead of cytotoxic edema) series 3, image 13. In the genu region there is abnormal cortical thickening (primarily in the right hemisphere) and mass like heterogeneity as seen on coronal image 24. No acute intracranial hemorrhage identified. No ventriculomegaly. No areas suggestive of cytotoxic edema identified. The body and splenium of the corpus callosum appear normal. No cortical encephalomalacia identified. Vascular: Calcified atherosclerosis at the skull base. No suspicious intracranial vascular hyperdensity. Skull: No acute osseous abnormality identified. Sinuses/Orbits: Clear. Other: Calcified scalp vasculature. No acute orbit or scalp soft tissue findings. ASPECTS [facility] Stroke Program Early CT Score) Total score (0-10 with 10 being normal): 10. IMPRESSION: 1. Abnormal appearance of both anterior inferior frontal lobes and the anterior corpus callosum, but more resembling a tumor which has crossed midline rather than infarct. Recommend Brain MRI without and with contrast when possible. 2. No acute intracranial hemorrhage. 3. ASPECTS is 10. 4. The above was relayed via text pager to Dr. Jerelyn Charles on 02/04/2017 at 1541 hours. Electronically Signed   By: Genevie Ann M.D.   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  Chronic post-transplant immunosuppression may have contributed to his underlying gliomagenesis.  We are still waiting on  diagnostic confirmation via formal pathologic assessment including routine glioma biomarkers.  The posterior fossa nodule raises concern for leptomeningeal spread.  In addition, further findings on exam localize to left brainstem parenchyma (gaze paresis) and either the nucleus of 7th nerve or the facial nerve itself on the left.  These findings are not visualized on MRI but likely represent additional foci of disease.  MRI of the total spine should ideally be performed prior to radiation planning.  Treatment options are very limited because of his transplant and immunosuppression.  Tumor-treating fields could be applied safely after radiation in lieu of chemotherapy.   His case will be discussed in detail in multi-disciplinary brain tumor board on Monday, 11/12.  Formal follow up with neuro-oncology and radiation oncology, as well as any further imaging, will be planned at that time.  He may continue the prednisone component of his renal transplant immunosuppression rather than adding additional corticosteroids.  All questions were answered. The patient knows to call the clinic with any problems, questions or concerns.  The total time spent in the encounter was 80 minutes and more than 50% was on counseling and review of test results     Ventura Sellers, MD 02/09/2017 3:26 PM

## 2017-02-09 NOTE — Anesthesia Procedure Notes (Signed)
Arterial Line Insertion Start/End11/11/2016 11:10 AM, 02/09/2017 11:15 AM Performed by: Lowella Dell, CRNA, CRNA  Patient location: Pre-op. Preanesthetic checklist: patient identified, IV checked, site marked, risks and benefits discussed, surgical consent, monitors and equipment checked, pre-op evaluation and timeout performed Right, radial was placed Catheter size: 20 G Hand hygiene performed  and maximum sterile barriers used   Attempts: 1 Procedure performed without using ultrasound guided technique. Following insertion, dressing applied and Biopatch. Post procedure assessment: normal and unchanged  Patient tolerated the procedure well with no immediate complications.

## 2017-02-09 NOTE — Progress Notes (Signed)
No acute events Neuro unchanged INR < 1.5 Ready for right frontal stereotactic biopsy He and I discussed the risks, benefits, and alternatives to surgery the other day. We have reviewed these today and I have answered his questions.  He wishes to proceed.

## 2017-02-09 NOTE — Transfer of Care (Signed)
Immediate Anesthesia Transfer of Care Note  Patient: Jeffrey Perez  Procedure(s) Performed: Right Frontal stereotactic brain biopsy/brainlab (Right ) APPLICATION OF CRANIAL NAVIGATION (N/A )  Patient Location: PACU  Anesthesia Type:General  Level of Consciousness: awake, alert , oriented and patient cooperative  Airway & Oxygen Therapy: Patient Spontanous Breathing and Patient connected to nasal cannula oxygen  Post-op Assessment: Report given to RN, Post -op Vital signs reviewed and stable and Patient moving all extremities X 4  Post vital signs: Reviewed and stable  Last Vitals:  BP 110/54 HR 97 RR 14 SpO2 97 on 4L [place] Resting comfortably, maintains good airway, denies pain.  Last Pain:  Vitals:   02/09/17 1025  TempSrc: Oral  PainSc: 0-No pain         Complications: No apparent anesthesia complications

## 2017-02-09 NOTE — Progress Notes (Signed)
PROGRESS NOTE    Jeffrey Perez  AST:[id number] DOB: [birth year] DOA: 02/04/2017 PCP: System, Pcp Not In   Chief Complaint  Patient presents with  . Facial Droop    Brief Narrative:  HPI on 02/04/2017 [name] is a 71 y.o. male with a medical history of renal transplant/end-stage renal disease, gout, hypertension, anemia, who presented to the emergency department with complaints of left facial droop and concern for stroke. Patient was recently admitted and discharged on 01/28/2017 for generalized weakness. Patient reports that since leaving the hospital last week, he started having some left-sided weakness of his face. Family friend present at bedside, reported that she noticed it was worse today. Patient also reported some difficulty walking and dizziness. Denies any recent illness, travel. Denies chest pain, shortness of breath, abdominal pain, nausea vomiting, diarrhea or constipation, changes in urinary or bowel habits.   Assessment & Plan   Left facial droop/Brain lesion -?Bells palsy -CT head: Abnormal appearance of both anterior inferior frontal lobes and anterior corpus callosum, resembling a tumor which has crossed midline rather than infarct.  -MRI with and without contrast: Heterogeneously enhancing mass lesion in the anterior inferior frontal lobes crosses midline, measures 5.3 x 3.6 x 4.0 cm -Neurology consulted and appreciated -Neurosurgery consulted and appreciated, recommended large volume lumbar puncture with cytology and flow cytometry, however had tumor board today and now recommending sterotactic biopsy which will probably occur on 08/09/2016  -Interventional radiology consulted and appreciated, however no longer needed lumbar puncture -Continue eye drops as he complains that his left eye is burning (as he cannot blink) - S/p brain biopsy, 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   History of renal transplant/CKD -Transplant occurred 25 years ago, no longer on  dialysis -Unknown baseline creatinine, currently 1.70 -Continue monitor BMP -Continue mycophenolate, prednisone, tacrolimus  Paroxysmal atrial fibrillation -Currently in sinus rhythm -CHADSVASC 2 (based on age and HTN) -Coumadin held, INR now 1.99 -Heparin ordered for when INR <2 -Discussed reversal with neurology, did not recommend as this could make patient more thrombotic given this mass, allow for INR to trend downward Will hold heparin pot op for atleast 24 hours.  Chronic mesenteric anemia/thrombocytopenia -Hemoglobin and platelets appears to be at baseline -Continue monitor CBC  Essential hypertension -Continue Cardura  DVT Prophylaxis on heparin right now, SCD after surgery until cleared by neurosurgery.  Code Status: Full  Family Communication: None at bedside  Disposition Plan: to be determined.   Consultants Neurology Neurosurgery Interventional radiology Neuro-oncology   Procedures  None  Antibiotics   Anti-infectives (From admission, onward)   Start     Dose/Rate Route Frequency Ordered Stop   02/09/17 2030  ceFAZolin (ANCEF) IVPB 2g/100 mL premix     2 g 200 mL/hr over 30 Minutes Intravenous Every 8 hours 02/09/17 1457 02/10/17 1229   02/09/17 0800  ceFAZolin (ANCEF) IVPB 2g/100 mL premix     2 g 200 mL/hr over 30 Minutes Intravenous To ShortStay Surgical 02/08/17 2226 02/09/17 1205   02/09/17 0600  ceFAZolin (ANCEF) IVPB 2g/100 mL premix  Status:  Discontinued     2 g 200 mL/hr over 30 Minutes Intravenous On call to O.R. 02/08/17 2231 02/09/17 0738      Subjective:   Jeffrey Perez seen and examined today.  No new complains post-op  Objective:   Vitals:   02/09/17 1400 02/09/17 1415 02/09/17 1500 02/09/17 1600  BP: 95/62 107/65 94/78 131/71  Pulse: 69 74 66 62  Resp: 18 16 18 10   Temp:  97.8 F (36.6 C)   TempSrc:   Oral   SpO2: 98% 97% 98% 99%  Weight:   68.2 kg (150 lb 5.7 oz)   Height:   5\' 6"  (1.676 m)     Intake/Output  Summary (Last 24 hours) at 02/09/2017 1630 Last data filed at 02/09/2017 1600 Gross per 24 hour  Intake 1782 ml  Output 630 ml  Net 1152 ml   Filed Weights   02/04/17 1949 02/06/17 1344 02/09/17 1500  Weight: 65.1 kg (143 lb 9.6 oz) 66 kg (145 lb 8.1 oz) 68.2 kg (150 lb 5.7 oz)   Exam (no change from exam on 02/06/2017)  General: Well developed, well nourished, NAD, appears stated age  60: NCAT, mucous membranes moist. Left facial droop  Cardiovascular: S1 S2 auscultated, RRR, +SEM  Respiratory: Clear to auscultation bilaterally with equal chest rise  Abdomen: Soft, nontender, nondistended, + bowel sounds  Extremities: warm dry without cyanosis clubbing or edema  Neuro: AAOx3, left hemianopsia and facial weakness, otherwise nonfocal  Psych: Normal affect and demeanor with intact judgement and insight  Data Reviewed: I have personally reviewed following labs and imaging studies  CBC: Recent Labs  Lab 02/04/17 1507  02/05/17 0401 02/06/17 0250 02/07/17 0534 02/08/17 0444 02/08/17 2342  WBC 5.3  --  5.2 4.9 4.3 4.6 4.4  NEUTROABS 3.5  --   --   --   --   --   --   HGB 10.8*   < > 10.0* 9.9* 9.6* 9.8* 10.2*  HCT 35.7*   < > 32.1* 32.6* 30.8* 31.3* 32.5*  MCV 92.5  --  91.2 91.3 91.4 90.5 90.3  PLT 125*  --  112* 113* 99* 101* 100*   < > = values in this interval not displayed.   Basic Metabolic Panel: Recent Labs  Lab 02/04/17 1507 02/04/17 1526 02/05/17 0401 02/06/17 0250 02/08/17 0444 02/08/17 2342  NA 140 143 140 138 138 138  K 3.8 3.8 4.4 4.1 3.7 3.9  CL 109 109 108 106 109 110  CO2 23  --  24 24 21* 23  GLUCOSE 124* 119* 85 89 86 100*  BUN 41* 40* 36* 35* 41* 37*  CREATININE 1.70* 1.80* 1.69* 1.70* 1.79* 1.69*  CALCIUM 9.7  --  9.2 8.8* 8.8* 8.8*  MG  --   --   --   --   --  1.9   GFR: Estimated Creatinine Clearance: 35.1 mL/min (A) (by C-G formula based on SCr of 1.69 mg/dL (H)). Liver Function Tests: Recent Labs  Lab 02/04/17 1507  AST 21   ALT 17  ALKPHOS 58  BILITOT 0.7  PROT 6.4*  ALBUMIN 3.7   No results for input(s): LIPASE, AMYLASE in the last 168 hours. No results for input(s): AMMONIA in the last 168 hours. Coagulation Profile: Recent Labs  Lab 02/05/17 0401 02/06/17 0250 02/07/17 0534 02/08/17 0444 02/08/17 2342  INR 3.14 2.29 1.99 1.65 1.42   Cardiac Enzymes: No results for input(s): CKTOTAL, CKMB, CKMBINDEX, TROPONINI in the last 168 hours. BNP (last 3 results) No results for input(s): PROBNP in the last 8760 hours. HbA1C: No results for input(s): HGBA1C in the last 72 hours. CBG: Recent Labs  Lab 02/04/17 1532 02/09/17 1602  GLUCAP 115* 118*   Lipid Profile: No results for input(s): CHOL, HDL, LDLCALC, TRIG, CHOLHDL, LDLDIRECT in the last 72 hours. Thyroid Function Tests: No results for input(s): TSH, T4TOTAL, FREET4, T3FREE, THYROIDAB in the last 72 hours. Anemia Panel: No  results for input(s): VITAMINB12, FOLATE, FERRITIN, TIBC, IRON, RETICCTPCT in the last 72 hours. Urine analysis:    Component Value Date/Time   COLORURINE YELLOW 01/25/2017 1814   APPEARANCEUR CLEAR 01/25/2017 1814   LABSPEC 1.008 01/25/2017 1814   PHURINE 6.0 01/25/2017 1814   GLUCOSEU NEGATIVE 01/25/2017 1814   HGBUR NEGATIVE 01/25/2017 1814   BILIRUBINUR NEGATIVE 01/25/2017 1814   KETONESUR NEGATIVE 01/25/2017 1814   PROTEINUR NEGATIVE 01/25/2017 1814   UROBILINOGEN 0.2 05/10/2013 1015   NITRITE NEGATIVE 01/25/2017 1814   LEUKOCYTESUR NEGATIVE 01/25/2017 1814   Sepsis Labs: @LABRCNTIP (procalcitonin:4,lacticidven:4)  ) Recent Results (from the past 240 hour(s))  Surgical pcr screen     Status: None   Collection Time: 02/08/17 10:41 PM  Result Value Ref Range Status   MRSA, PCR NEGATIVE NEGATIVE Final   Staphylococcus aureus NEGATIVE NEGATIVE Final    Comment: (NOTE) The Xpert SA Assay (FDA approved for NASAL specimens in patients 5 years of age and older), is one component of a  comprehensive surveillance program. It is not intended to diagnose infection nor to guide or monitor treatment.       Radiology Studies: No results found.   Scheduled Meds: . cholecalciferol  1,000 Units Oral Daily  . docusate sodium  100 mg Oral BID  . doxazosin  8 mg Oral BID  . mycophenolate  360 mg Oral BID  . pantoprazole (PROTONIX) IV  40 mg Intravenous QHS  . pneumococcal 23 valent vaccine  0.5 mL Intramuscular Tomorrow-1000  . predniSONE  5 mg Oral Q breakfast  . senna  1 tablet Oral BID  . sodium chloride flush  3 mL Intravenous Q12H  . tacrolimus  1 mg Oral BID   Continuous Infusions: . sodium chloride 250 mL (02/09/17 0439)  . sodium chloride 100 mL/hr at 02/09/17 1500  .  ceFAZolin (ANCEF) IV    . [START ON 02/10/2017] heparin       LOS: 3 days   Time Spent in minutes   30 minutes Author:  Berle Mull, MD [facility] Pager: [phone] 02/09/2017 4:30 PM     After 7pm go to [email or web address] - password [email or web address]  And look for the night coverage person covering for me after hours  [facility] Office  [phone]

## 2017-02-09 NOTE — Progress Notes (Signed)
Report called in to short stay nurse.

## 2017-02-09 NOTE — Progress Notes (Signed)
Pt leaving unit for biopsy. No complaints of pain or discomfort. No noted distress.

## 2017-02-09 NOTE — Progress Notes (Signed)
ANTICOAGULATION CONSULT NOTE  Pharmacy Consult for heparin Indication: atrial fibrillation  No Known Allergies  Patient Measurements: Height: 5\' 6"  (167.6 cm) Weight: 145 lb 8.1 oz (66 kg) IBW/kg (Calculated) : 63.8  Heparin dosing weight: 66 kg   Vital Signs: Temp: 98.2 F (36.8 C) (11/08 0812) Temp Source: Oral (11/08 0812) BP: 128/50 (11/08 0812) Pulse Rate: 65 (11/08 0812)  Labs: Recent Labs    02/07/17 0534 02/08/17 0444 02/08/17 1256 02/08/17 2342  HGB 9.6* 9.8*  --  10.2*  HCT 30.8* 31.3*  --  32.5*  PLT 99* 101*  --  100*  LABPROT 22.4* 19.4*  --  17.2*  INR 1.99 1.65  --  1.42  HEPARINUNFRC  --  0.22* 0.42  --   CREATININE  --  1.79*  --  1.69*    Assessment: 71 year old male seen in ED with facial drop- has been worsening over last several days to 1 week. Stroke ruled out. Imaging shows mass in frontal lobe and lesions in 4th ventricle.   Warfarin for Afib is on hold. Patient was transitioned to heparin infusion; however, this was turned off at midnight in anticipation of procedure today.   Goal of Therapy:  Heparin level 0.3-0.7 units/ml Monitor platelets by anticoagulation protocol: Yes   Plan:  -F/u plan to resume heparin after procedure -Will need to reorder levels   Hughes Better, PharmD, BCPS Clinical Pharmacist 02/09/2017 9:35 AM

## 2017-02-09 NOTE — Brief Op Note (Signed)
02/04/2017 - 02/09/2017  1:11 PM  PATIENT:  Jeffrey Perez  71 y.o. male  PRE-OPERATIVE DIAGNOSIS:  Brain mass  POST-OPERATIVE DIAGNOSIS:  Brain mass  PROCEDURE:  Procedure(s) with comments: Right Frontal stereotactic brain biopsy/brainlab (Right) - Right Frontal stereotactic brain biopsy/brainlab APPLICATION OF CRANIAL NAVIGATION (N/A)  SURGEON:  Surgeon(s) and Role:    * Ditty, Kevan Ny, MD - Primary  PHYSICIAN ASSISTANT: None  ASSISTANTS: none   ANESTHESIA:   general  EBL:  Minimal   BLOOD ADMINISTERED:none  DRAINS: none   LOCAL MEDICATIONS USED:  MARCAINE    and LIDOCAINE   SPECIMEN:  Core Needle Biopsy  DISPOSITION OF SPECIMEN:  PATHOLOGY  COUNTS:  YES  TOURNIQUET:  * No tourniquets in log *  DICTATION: .Dragon Dictation  PLAN OF CARE: Admit to inpatient   PATIENT DISPOSITION:  ICU - extubated and stable.   Delay start of Pharmacological VTE agent (>24hrs) due to surgical blood loss or risk of bleeding: yes

## 2017-02-09 NOTE — Progress Notes (Signed)
Report given to erika rn as caregiver 

## 2017-02-10 ENCOUNTER — Inpatient Hospital Stay (HOSPITAL_COMMUNITY): Payer: Medicare Other

## 2017-02-10 LAB — BASIC METABOLIC PANEL
Anion gap: 7 (ref 5–15)
BUN: 28 mg/dL — AB (ref 6–20)
CALCIUM: 8.3 mg/dL — AB (ref 8.9–10.3)
CHLORIDE: 110 mmol/L (ref 101–111)
CO2: 20 mmol/L — ABNORMAL LOW (ref 22–32)
CREATININE: 1.52 mg/dL — AB (ref 0.61–1.24)
GFR, EST AFRICAN AMERICAN: 51 mL/min — AB (ref >60)
GFR, EST NON AFRICAN AMERICAN: 44 mL/min — AB (ref >60)
Glucose, Bld: 118 mg/dL — ABNORMAL HIGH (ref 65–99)
Potassium: 4.2 mmol/L (ref 3.5–5.1)
SODIUM: 137 mmol/L (ref 135–145)

## 2017-02-10 LAB — MAGNESIUM: MAGNESIUM: 1.7 mg/dL (ref 1.7–2.4)

## 2017-02-10 LAB — CBC
HCT: 28.6 % — ABNORMAL LOW (ref 39.0–52.0)
HEMOGLOBIN: 9.1 g/dL — AB (ref 13.0–17.0)
MCH: 28.7 pg (ref 26.0–34.0)
MCHC: 31.8 g/dL (ref 30.0–36.0)
MCV: 90.2 fL (ref 78.0–100.0)
PLATELETS: 97 10*3/uL — AB (ref 150–400)
RBC: 3.17 MIL/uL — ABNORMAL LOW (ref 4.22–5.81)
RDW: 14.2 % (ref 11.5–15.5)
WBC: 4.8 10*3/uL (ref 4.0–10.5)

## 2017-02-10 LAB — PROTIME-INR
INR: 1.18
PROTHROMBIN TIME: 14.9 s (ref 11.4–15.2)

## 2017-02-10 MED ORDER — HEPARIN (PORCINE) IN NACL 100-0.45 UNIT/ML-% IJ SOLN
1250.0000 [IU]/h | INTRAMUSCULAR | Status: DC
  Administered 2017-02-11: 1100 [IU]/h via INTRAVENOUS
  Administered 2017-02-12 – 2017-02-13 (×2): 1250 [IU]/h via INTRAVENOUS
  Filled 2017-02-10 (×3): qty 250

## 2017-02-10 MED ORDER — POLYETHYLENE GLYCOL 3350 17 G PO PACK
17.0000 g | PACK | Freq: Every day | ORAL | Status: DC
Start: 2017-02-10 — End: 2017-02-17
  Administered 2017-02-12 – 2017-02-15 (×4): 17 g via ORAL
  Filled 2017-02-10 (×4): qty 1

## 2017-02-10 MED ORDER — PANTOPRAZOLE SODIUM 40 MG PO TBEC
40.0000 mg | DELAYED_RELEASE_TABLET | Freq: Every day | ORAL | Status: DC
  Administered 2017-02-10 – 2017-02-16 (×7): 40 mg via ORAL
  Filled 2017-02-10 (×7): qty 1

## 2017-02-10 MED ORDER — SENNOSIDES-DOCUSATE SODIUM 8.6-50 MG PO TABS
1.0000 | ORAL_TABLET | Freq: Two times a day (BID) | ORAL | Status: DC
  Administered 2017-02-10 – 2017-02-16 (×12): 1 via ORAL
  Filled 2017-02-10 (×12): qty 1

## 2017-02-10 NOTE — Progress Notes (Signed)
Received report at bedside from Linna Hoff, [place]. Patient transferred from 4N, alert and oriented X 4. Originally admitted on 11/3 with facial droop, it is found that patient has brain lesion. Hx of renal transplant, 25 years priors. Denies pain. Will monitor.

## 2017-02-10 NOTE — Progress Notes (Signed)
Spoke with [place], Xenia APP from [facility] regarding 8 pm  cbg of 222. No new orders received.

## 2017-02-10 NOTE — Anesthesia Postprocedure Evaluation (Signed)
Anesthesia Post Note  Patient: Jeffrey Perez  Procedure(s) Performed: Right Frontal stereotactic brain biopsy/brainlab (Right ) APPLICATION OF CRANIAL NAVIGATION (N/A )     Patient location during evaluation: PACU Anesthesia Type: General Level of consciousness: sedated Pain management: pain level controlled Vital Signs Assessment: post-procedure vital signs reviewed and stable Respiratory status: spontaneous breathing and respiratory function stable Cardiovascular status: stable Postop Assessment: no apparent nausea or vomiting Anesthetic complications: no                 Andi Layfield DANIEL

## 2017-02-10 NOTE — Progress Notes (Signed)
ANTICOAGULATION CONSULT NOTE  Pharmacy Consult for heparin Indication: atrial fibrillation  No Known Allergies  Patient Measurements: Height: 5\' 6"  (167.6 cm) Weight: 150 lb 5.7 oz (68.2 kg) IBW/kg (Calculated) : 63.8  Heparin dosing weight: 66 kg   Vital Signs: Temp: 97.9 F (36.6 C) (11/09 0400) Temp Source: Oral (11/09 0400) BP: 143/70 (11/09 0700) Pulse Rate: 56 (11/09 0700)  Labs: Recent Labs    02/08/17 0444 02/08/17 1256 02/08/17 2342 02/10/17 0438  HGB 9.8*  --  10.2* 9.1*  HCT 31.3*  --  32.5* 28.6*  PLT 101*  --  100* 97*  LABPROT 19.4*  --  17.2* 14.9  INR 1.65  --  1.42 1.18  HEPARINUNFRC 0.22* 0.42  --   --   CREATININE 1.79*  --  1.69* 1.52*   Assessment: 71 year old male seen in ED with facial drop- has been worsening over last several days to 1 week. Stroke ruled out. Imaging shows mass in frontal lobe and lesions in 4th ventricle.   Warfarin for Afib is on hold. Patient was transitioned to heparin infusion which was stopped for the biopsy 11/8. Primary team would like to resume heparin infusion 24 hours post procedure (today at 1400).   HgB 9.1, PLT 97; No overt bleeding documented.   Goal of Therapy:  Heparin level 0.3-0.7 units/ml Monitor platelets by anticoagulation protocol: Yes   Plan:  Resume heparin gtt at 1100 units/hr at 14:00 Daily heparin level and CBC Monitor for s/sx of bleeding F/U resuming warfarin  Georga Bora, PharmD Clinical Pharmacist 02/10/2017 8:36 AM

## 2017-02-10 NOTE — Progress Notes (Signed)
ANTICOAGULATION CONSULT NOTE - Follow Up Consult  Pharmacy Consult for Heparin Indication: atrial fibrillation  No Known Allergies  Patient Measurements: Height: 5\' 6"  (167.6 cm) Weight: 150 lb 5.7 oz (68.2 kg) IBW/kg (Calculated) : 63.8 Heparin Dosing Weight: 68 kg  Labs: Recent Labs    02/08/17 0444 02/08/17 1256 02/08/17 2342 02/10/17 0438  HGB 9.8*  --  10.2* 9.1*  HCT 31.3*  --  32.5* 28.6*  PLT 101*  --  100* 97*  LABPROT 19.4*  --  17.2* 14.9  INR 1.65  --  1.42 1.18  HEPARINUNFRC 0.22* 0.42  --   --   CREATININE 1.79*  --  1.69* 1.52*   Assessment: 71 year old male seen in ED on 02/04/17 with facial drop- had been worsening over last several days to 1 week. Stroke ruled out. Imaging showed mass in frontal lobe and lesions in 4th ventricle.   Warfarin for Afib on hold. Patient was transitioned to heparin infusion which was stopped for the biopsy on 11/8.    Prior orders to resume heparin today at 2pm (24 hrs after stereotactic biopsy) modified to resume at 8am on 02/11/17 per Neurosurgery.  Goal of Therapy:  Heparin level 0.3-0.7 units/ml Monitor platelets by anticoagulation protocol: Yes   Plan:   Resume heparin drip at 1100 units/hr at 8am on 02/11/17.  Heparin level ~8 hr after drip resumes.  Daily heparin level, PT/INR and CBC.  Follow up for resuming Coumadin.  Arty Baumgartner, [place] Pager: [phone] 02/10/2017,4:48 PM

## 2017-02-10 NOTE — Plan of Care (Signed)
After surgery patient is alert and oriented, and states he is free of pain. Nausea has been controlled by antiemetics.

## 2017-02-10 NOTE — Progress Notes (Signed)
No acute events Some vomiting since yesterday Neurologically unchanged from pre-op Bandage c/d/i Ok to transfer out Nausea and vomiting likely due to anesthetic; will not re-image because he hasn't changed neurologically Ok to resume heparin tomorrow No pending neurosurgical issues; will follow from afar Call with questions Follow up in 3 weeks

## 2017-02-10 NOTE — Progress Notes (Signed)
PROGRESS NOTE    Jeffrey Perez  GEX:[id number] DOB: 1946/01/18 DOA: 02/04/2017 PCP: System, Pcp Not In   Chief Complaint  Patient presents with  . Facial Droop    Brief Narrative:  HPI on 02/04/2017 Jeffrey Perez is a 71 y.o. male with a medical history of renal transplant/end-stage renal disease, gout, hypertension, anemia, who presented to the emergency department with complaints of left facial droop and concern for stroke. Patient was recently admitted and discharged on 01/28/2017 for generalized weakness. Patient reports that since leaving the hospital last week, he started having some left-sided weakness of his face. Family friend present at bedside, reported that she noticed it was worse today. Patient also reported some difficulty walking and dizziness. Denies any recent illness, travel. Denies chest pain, shortness of breath, abdominal pain, nausea vomiting, diarrhea or constipation, changes in urinary or bowel habits.   Assessment & Plan   Left facial droop/Brain lesion -?Bells palsy -CT head: Abnormal appearance of both anterior inferior frontal lobes and anterior corpus callosum, resembling a tumor which has crossed midline rather than infarct.  -MRI with and without contrast: Heterogeneously enhancing mass lesion in the anterior inferior frontal lobes crosses midline, measures 5.3 x 3.6 x 4.0 cm -Neurology consulted and appreciated -Neurosurgery consulted and appreciated, recommended large volume lumbar puncture with cytology and flow cytometry, however had tumor board today and now recommending sterotactic biopsy which will probably occur on 08/09/2016  -Interventional radiology consulted and appreciated, however no longer needed lumbar puncture -Continue eye drops as he complains that his left eye is burning (as he cannot blink) - S/p brain biopsy, 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   History of renal transplant/CKD -Transplant occurred 25 years ago, no longer on  dialysis -Unknown baseline creatinine, currently 1.70 -Continue monitor BMP -Continue mycophenolate, prednisone, tacrolimus  Paroxysmal atrial fibrillation -Currently in sinus rhythm -CHADSVASC 2 (based on age and HTN) -Coumadin held, INR now 1.99 -Heparin ordered for when INR <2 -For the surgery postoperatively heparin is on hold.  Resume tomorrow.  Although will discuss with pharmacy whether the patient can be resumed directly on Coumadin without the bridging.  Chronic mesenteric anemia/thrombocytopenia -Hemoglobin and platelets appears to be at baseline -Continue monitor CBC  Essential hypertension -Continue Cardura  Nausea and vomiting. Likely from constipation.  Continue stool softener.  DVT Prophylaxis on heparin right now, SCD after surgery until cleared by neurosurgery.  Code Status: Full  Family Communication: None at bedside  Disposition Plan: to be determined.   Consultants Neurology Neurosurgery Interventional radiology Neuro-oncology   Procedures  None  Antibiotics   Anti-infectives (From admission, onward)   Start     Dose/Rate Route Frequency Ordered Stop   02/09/17 2030  ceFAZolin (ANCEF) IVPB 2g/100 mL premix     2 g 200 mL/hr over 30 Minutes Intravenous Every 8 hours 02/09/17 1457 02/10/17 0505   02/09/17 0800  ceFAZolin (ANCEF) IVPB 2g/100 mL premix     2 g 200 mL/hr over 30 Minutes Intravenous To ShortStay Surgical 02/08/17 2226 02/09/17 1205   02/09/17 0600  ceFAZolin (ANCEF) IVPB 2g/100 mL premix  Status:  Discontinued     2 g 200 mL/hr over 30 Minutes Intravenous On call to O.R. 02/08/17 2231 02/09/17 0738      Subjective:   Jeffrey Perez seen and examined today.  Had some nausea and vomiting earlier.  Now resolved.  Other acute complaints.  Objective:   Vitals:   02/10/17 1100 02/10/17 1142 02/10/17 1200 02/10/17 1453  BP: 128/67  125/69  130/74  Pulse: (!) 59  71 72  Resp: (!) 23  19 20   Temp:  97.8 F (36.6 C)  97.9 F  (36.6 C)  TempSrc:  Oral  Oral  SpO2: 99%  99% 100%  Weight:      Height:        Intake/Output Summary (Last 24 hours) at 02/10/2017 1656 Last data filed at 02/10/2017 1200 Gross per 24 hour  Intake 2780 ml  Output 1625 ml  Net 1155 ml   Filed Weights   02/04/17 1949 02/06/17 1344 02/09/17 1500  Weight: 65.1 kg (143 lb 9.6 oz) 66 kg (145 lb 8.1 oz) 68.2 kg (150 lb 5.7 oz)   Exam (no change from exam on 02/06/2017)  General: Well developed, well nourished, NAD, appears stated age  25: NCAT, mucous membranes moist. Left facial droop  Cardiovascular: S1 S2 auscultated, RRR, +SEM  Respiratory: Clear to auscultation bilaterally with equal chest rise  Abdomen: Soft, nontender, nondistended, + bowel sounds  Extremities: warm dry without cyanosis clubbing or edema  Neuro: AAOx3, left hemianopsia and facial weakness, otherwise nonfocal  Psych: Normal affect and demeanor with intact judgement and insight  Data Reviewed: I have personally reviewed following labs and imaging studies  CBC: Recent Labs  Lab 02/04/17 1507  02/06/17 0250 02/07/17 0534 02/08/17 0444 02/08/17 2342 02/10/17 0438  WBC 5.3   < > 4.9 4.3 4.6 4.4 4.8  NEUTROABS 3.5  --   --   --   --   --   --   HGB 10.8*   < > 9.9* 9.6* 9.8* 10.2* 9.1*  HCT 35.7*   < > 32.6* 30.8* 31.3* 32.5* 28.6*  MCV 92.5   < > 91.3 91.4 90.5 90.3 90.2  PLT 125*   < > 113* 99* 101* 100* 97*   < > = values in this interval not displayed.   Basic Metabolic Panel: Recent Labs  Lab 02/05/17 0401 02/06/17 0250 02/08/17 0444 02/08/17 2342 02/10/17 0438  NA 140 138 138 138 137  K 4.4 4.1 3.7 3.9 4.2  CL 108 106 109 110 110  CO2 24 24 21* 23 20*  GLUCOSE 85 89 86 100* 118*  BUN 36* 35* 41* 37* 28*  CREATININE 1.69* 1.70* 1.79* 1.69* 1.52*  CALCIUM 9.2 8.8* 8.8* 8.8* 8.3*  MG  --   --   --  1.9 1.7   GFR: Estimated Creatinine Clearance: 39.1 mL/min (A) (by C-G formula based on SCr of 1.52 mg/dL (H)). Liver Function  Tests: Recent Labs  Lab 02/04/17 1507  AST 21  ALT 17  ALKPHOS 58  BILITOT 0.7  PROT 6.4*  ALBUMIN 3.7   No results for input(s): LIPASE, AMYLASE in the last 168 hours. No results for input(s): AMMONIA in the last 168 hours. Coagulation Profile: Recent Labs  Lab 02/06/17 0250 02/07/17 0534 02/08/17 0444 02/08/17 2342 02/10/17 0438  INR 2.29 1.99 1.65 1.42 1.18   Cardiac Enzymes: No results for input(s): CKTOTAL, CKMB, CKMBINDEX, TROPONINI in the last 168 hours. BNP (last 3 results) No results for input(s): PROBNP in the last 8760 hours. HbA1C: No results for input(s): HGBA1C in the last 72 hours. CBG: Recent Labs  Lab 02/04/17 1532 02/09/17 1602 02/09/17 1939  GLUCAP 115* 118* 222*   Lipid Profile: No results for input(s): CHOL, HDL, LDLCALC, TRIG, CHOLHDL, LDLDIRECT in the last 72 hours. Thyroid Function Tests: No results for input(s): TSH, T4TOTAL, FREET4, T3FREE, THYROIDAB in the last 72 hours. Anemia Panel:  No results for input(s): VITAMINB12, FOLATE, FERRITIN, TIBC, IRON, RETICCTPCT in the last 72 hours. Urine analysis:    Component Value Date/Time   COLORURINE YELLOW 01/25/2017 1814   APPEARANCEUR CLEAR 01/25/2017 1814   LABSPEC 1.008 01/25/2017 1814   PHURINE 6.0 01/25/2017 1814   GLUCOSEU NEGATIVE 01/25/2017 1814   HGBUR NEGATIVE 01/25/2017 1814   BILIRUBINUR NEGATIVE 01/25/2017 1814   KETONESUR NEGATIVE 01/25/2017 1814   PROTEINUR NEGATIVE 01/25/2017 1814   UROBILINOGEN 0.2 05/10/2013 1015   NITRITE NEGATIVE 01/25/2017 1814   LEUKOCYTESUR NEGATIVE 01/25/2017 1814   Sepsis Labs: @LABRCNTIP (procalcitonin:4,lacticidven:4)  ) Recent Results (from the past 240 hour(s))  Surgical pcr screen     Status: None   Collection Time: 02/08/17 10:41 PM  Result Value Ref Range Status   MRSA, PCR NEGATIVE NEGATIVE Final   Staphylococcus aureus NEGATIVE NEGATIVE Final    Comment: (NOTE) The Xpert SA Assay (FDA approved for NASAL specimens in patients  30 years of age and older), is one component of a comprehensive surveillance program. It is not intended to diagnose infection nor to guide or monitor treatment.       Radiology Studies: Dg Abd Portable 1v  Result Date: 02/10/2017 CLINICAL DATA:  Nausea since early this morning EXAM: PORTABLE ABDOMEN - 1 VIEW COMPARISON:  Portable exam 1238 hours without priors for comparison FINDINGS: Nonobstructive bowel gas pattern. No bowel dilatation or bowel wall thickening. Scattered gas and stool throughout colon to rectum. Bones demineralized with degenerative changes and dextroconvex scoliosis lumbar spine. Scattered atherosclerotic calcifications. IMPRESSION: Normal bowel gas pattern. Electronically Signed   By: Lavonia Dana M.D.   On: 02/10/2017 12:59     Scheduled Meds: . cholecalciferol  1,000 Units Oral Daily  . docusate sodium  100 mg Oral BID  . doxazosin  8 mg Oral BID  . mycophenolate  360 mg Oral BID  . pantoprazole  40 mg Oral Daily  . pneumococcal 23 valent vaccine  0.5 mL Intramuscular Tomorrow-1000  . polyethylene glycol  17 g Oral Daily  . predniSONE  5 mg Oral Q breakfast  . senna-docusate  1 tablet Oral BID  . sodium chloride flush  3 mL Intravenous Q12H  . tacrolimus  1 mg Oral BID   Continuous Infusions: . sodium chloride 250 mL (02/09/17 0439)  . sodium chloride 100 mL/hr at 02/10/17 0818  . [START ON 02/11/2017] heparin       LOS: 4 days   Time Spent in minutes   30 minutes Author:  Berle Mull, MD [facility] Pager: [phone] 02/10/2017 4:56 PM     After 7pm go to [email or web address] - password [email or web address]  And look for the night coverage person covering for me after hours  [facility] Office  [phone]

## 2017-02-11 LAB — BASIC METABOLIC PANEL
Anion gap: 5 (ref 5–15)
BUN: 25 mg/dL — AB (ref 6–20)
CHLORIDE: 113 mmol/L — AB (ref 101–111)
CO2: 22 mmol/L (ref 22–32)
CREATININE: 1.4 mg/dL — AB (ref 0.61–1.24)
Calcium: 8.5 mg/dL — ABNORMAL LOW (ref 8.9–10.3)
GFR calc Af Amer: 56 mL/min — ABNORMAL LOW (ref >60)
GFR calc non Af Amer: 48 mL/min — ABNORMAL LOW (ref >60)
GLUCOSE: 85 mg/dL (ref 65–99)
Potassium: 3.9 mmol/L (ref 3.5–5.1)
SODIUM: 140 mmol/L (ref 135–145)

## 2017-02-11 LAB — CBC
HCT: 29.8 % — ABNORMAL LOW (ref 39.0–52.0)
Hemoglobin: 9.2 g/dL — ABNORMAL LOW (ref 13.0–17.0)
MCH: 28 pg (ref 26.0–34.0)
MCHC: 30.9 g/dL (ref 30.0–36.0)
MCV: 90.9 fL (ref 78.0–100.0)
Platelets: 87 10*3/uL — ABNORMAL LOW (ref 150–400)
RBC: 3.28 MIL/uL — ABNORMAL LOW (ref 4.22–5.81)
RDW: 14.6 % (ref 11.5–15.5)
WBC: 3.3 10*3/uL — ABNORMAL LOW (ref 4.0–10.5)

## 2017-02-11 LAB — PROTIME-INR
INR: 1.18
PROTHROMBIN TIME: 14.9 s (ref 11.4–15.2)

## 2017-02-11 LAB — HEPARIN LEVEL (UNFRACTIONATED): HEPARIN UNFRACTIONATED: 0.18 [IU]/mL — AB (ref 0.30–0.70)

## 2017-02-11 NOTE — Plan of Care (Signed)
  Progressing Education: Knowledge of [place] [facility] information/materials will improve 02/11/2017 1635 - Progressing by Milderd Meager, RN Safety: Ability to remain free from injury will improve 02/11/2017 1635 - Progressing by Milderd Meager, RN Health Behavior/Discharge Planning: Ability to manage health-related needs will improve 02/11/2017 1635 - Progressing by Milderd Meager, RN Pain Managment: General experience of comfort will improve 02/11/2017 1635 - Progressing by Milderd Meager, RN Physical Regulation: Ability to maintain clinical measurements within normal limits will improve 02/11/2017 1635 - Progressing by Milderd Meager, RN Will remain free from infection 02/11/2017 1635 - Progressing by Milderd Meager, RN Skin Integrity: Risk for impaired skin integrity will decrease 02/11/2017 1635 - Progressing by Milderd Meager, RN Tissue Perfusion: Risk factors for ineffective tissue perfusion will decrease 02/11/2017 1635 - Progressing by Milderd Meager, RN Activity: Risk for activity intolerance will decrease 02/11/2017 1635 - Progressing by Milderd Meager, RN Fluid Volume: Ability to maintain a balanced intake and output will improve 02/11/2017 1635 - Progressing by Milderd Meager, RN Nutrition: Adequate nutrition will be maintained 02/11/2017 1635 - Progressing by Milderd Meager, RN Bowel/Gastric: Will not experience complications related to bowel motility 02/11/2017 1635 - Progressing by Milderd Meager, RN Education: Knowledge of [place] Education information/materials will improve 02/11/2017 1635 - Progressing by Milderd Meager, RN Safety: Ability to remain free from injury will improve 02/11/2017 1635 - Progressing by Milderd Meager, RN Health Behavior/Discharge Planning: Ability to manage health-related needs will improve 02/11/2017 1635 - Progressing by Milderd Meager,  RN Pain Managment: General experience of comfort will improve 02/11/2017 1635 - Progressing by Milderd Meager, RN Physical Regulation: Ability to maintain clinical measurements within normal limits will improve 02/11/2017 1635 - Progressing by Milderd Meager, RN Will remain free from infection 02/11/2017 1635 - Progressing by Milderd Meager, RN Skin Integrity: Risk for impaired skin integrity will decrease 02/11/2017 1635 - Progressing by Milderd Meager, RN Tissue Perfusion: Risk factors for ineffective tissue perfusion will decrease 02/11/2017 1635 - Progressing by Milderd Meager, RN Activity: Risk for activity intolerance will decrease 02/11/2017 1635 - Progressing by Milderd Meager, RN Fluid Volume: Ability to maintain a balanced intake and output will improve 02/11/2017 1635 - Progressing by Milderd Meager, RN Nutrition: Adequate nutrition will be maintained 02/11/2017 1635 - Progressing by Milderd Meager, RN

## 2017-02-11 NOTE — Progress Notes (Signed)
PROGRESS NOTE    Jeffrey Perez  WGN:[id number] DOB: Aug 18, 1945 DOA: 02/04/2017 PCP: System, Pcp Not In   Chief Complaint  Patient presents with  . Facial Droop    Brief Narrative:  HPI on 02/04/2017 Jeffrey Perez is a 71 y.o. male with a medical history of renal transplant/end-stage renal disease, gout, hypertension, anemia, who presented to the emergency department with complaints of left facial droop and concern for stroke. Patient was recently admitted and discharged on 01/28/2017 for generalized weakness. Patient reports that since leaving the hospital last week, he started having some left-sided weakness of his face. Family friend present at bedside, reported that she noticed it was worse today. Patient also reported some difficulty walking and dizziness. Denies any recent illness, travel. Denies chest pain, shortness of breath, abdominal pain, nausea vomiting, diarrhea or constipation, changes in urinary or bowel habits.   Assessment & Plan   Left facial droop/Brain lesion -?Bells palsy -CT head: Abnormal appearance of both anterior inferior frontal lobes and anterior corpus callosum, resembling a tumor which has crossed midline rather than infarct.  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  Awaiting further recommendation.  History of renal transplant/CKD -Transplant occurred 25 years ago, no longer on dialysis -Unknown baseline creatinine, currently 1.70 -Continue monitor BMP -Continue mycophenolate, prednisone, tacrolimus  Paroxysmal atrial  fibrillation -Currently in sinus rhythm -CHADSVASC 2 (based on age and HTN) -Perioperatively Coumadin and heparin was on hold. -Given history of A. fib as well as presence of tumor, patient remains at high risk for thrombogenic event and therefore bridging is recommended. Has thrombocytopenia, chronic kidney disease and recent CNS surgery. Monitor in the hospital while the patient is bridged with IV heparin and Coumadin  Chronic anemia/thrombocytopenia -Hemoglobin and platelets appears to be at baseline -Continue monitor CBC  Essential hypertension -Continue Cardura  Nausea and vomiting. Likely from constipation.  Continue stool softener.  DVT Prophylaxis on heparin right now,   Code Status: Full  Family Communication: None at bedside  Disposition Plan: PT OT consult pending, most likely home with home health  Consultants Neurology Neurosurgery Interventional radiology Neuro-oncology   Procedures  Stereotactic brain biopsy  Antibiotics   Anti-infectives (From admission, onward)   Start     Dose/Rate Route Frequency Ordered Stop   02/09/17 2030  ceFAZolin (ANCEF) IVPB 2g/100 mL premix     2 g 200 mL/hr over 30 Minutes Intravenous Every 8 hours 02/09/17 1457 02/10/17 0505   02/09/17 0800  ceFAZolin (ANCEF) IVPB 2g/100 mL premix     2 g 200 mL/hr over 30 Minutes Intravenous To ShortStay Surgical 02/08/17 2226 02/09/17 1205   02/09/17 0600  ceFAZolin (ANCEF) IVPB 2g/100 mL premix  Status:  Discontinued     2 g 200 mL/hr over 30 Minutes Intravenous On call to O.R. 02/08/17 2231 02/09/17 0738      Subjective:   Jeffrey Perez seen and examined today.  Had some nausea and vomiting earlier.  Now resolved.  Other acute complaints.  Objective:   Vitals:   02/10/17 2125 02/11/17 0142 02/11/17 0623 02/11/17 0931  BP: (!) 143/70 (!) 143/73 (!) 156/80 105/61  Pulse: (!) 53  69 96 63  Resp: 18 20 18 16   Temp: 98 F (36.7 C) 98.3 F (36.8 C) (!) 97.5 F (36.4 C)     TempSrc: Oral Oral Oral Oral  SpO2: 98% 98% 97% 97%  Weight:   70.9 kg (156 lb 3.2 oz)   Height:        Intake/Output Summary (Last 24 hours) at 02/11/2017 1125 Last data filed at 02/11/2017 6378 Gross per 24 hour  Intake 1520 ml  Output 3325 ml  Net -1805 ml   Filed Weights   02/06/17 1344 02/09/17 1500 02/11/17 0623  Weight: 66 kg (145 lb 8.1 oz) 68.2 kg (150 lb 5.7 oz) 70.9 kg (156 lb 3.2 oz)   Exam (no change from exam on 02/06/2017)  General: Well developed, well nourished, NAD, appears stated age  HEENT: NCAT, mucous membranes moist. Left facial droop  Cardiovascular: S1 S2 auscultated, RRR, +SEM  Respiratory: Clear to auscultation bilaterally with equal chest rise  Abdomen: Soft, nontender, nondistended, + bowel sounds  Extremities: warm dry without cyanosis clubbing or edema  Neuro: AAOx3, left hemianopsia and facial weakness, otherwise nonfocal  Psych: Normal affect and demeanor with intact judgement and insight  Data Reviewed: I have personally reviewed following labs and imaging studies  CBC: Recent Labs  Lab 02/04/17 1507  02/07/17 0534 02/08/17 0444 02/08/17 2342 02/10/17 0438 02/11/17 0427  WBC 5.3   < > 4.3 4.6 4.4 4.8 3.3*  NEUTROABS 3.5  --   --   --   --   --   --   HGB 10.8*   < > 9.6* 9.8* 10.2* 9.1* 9.2*  HCT 35.7*   < > 30.8* 31.3* 32.5* 28.6* 29.8*  MCV 92.5   < > 91.4 90.5 90.3 90.2 90.9  PLT 125*   < > 99* 101* 100* 97* 87*   < > = values in this interval not displayed.   Basic Metabolic Panel: Recent Labs  Lab 02/06/17 0250 02/08/17 0444 02/08/17 2342 02/10/17 0438 02/11/17 0427  NA 138 138 138 137 140  K 4.1 3.7 3.9 4.2 3.9  CL 106 109 110 110 113*  CO2 24 21* 23 20* 22  GLUCOSE 89 86 100* 118* 85  BUN 35* 41* 37* 28* 25*  CREATININE 1.70* 1.79* 1.69* 1.52* 1.40*  CALCIUM 8.8* 8.8* 8.8* 8.3* 8.5*  MG  --   --  1.9 1.7  --    GFR: Estimated Creatinine Clearance: 42.4 mL/min (A) (by C-G formula based on SCr of 1.4  mg/dL (H)). Liver Function Tests: Recent Labs  Lab 02/04/17 1507  AST 21  ALT 17  ALKPHOS 58  BILITOT 0.7  PROT 6.4*  ALBUMIN 3.7   No results for input(s): LIPASE, AMYLASE in the last 168 hours. No results for input(s): AMMONIA in the last 168 hours. Coagulation Profile: Recent Labs  Lab 02/07/17 0534 02/08/17 0444 02/08/17 2342 02/10/17 0438 02/11/17 0427  INR 1.99 1.65 1.42 1.18 1.18   Cardiac Enzymes: No results for input(s): CKTOTAL, CKMB, CKMBINDEX, TROPONINI in the last 168 hours. BNP (last 3 results) No results for input(s): PROBNP in the last 8760 hours. HbA1C: No results for input(s): HGBA1C in the last 72 hours. CBG: Recent Labs  Lab 02/04/17 1532 02/09/17 1602 02/09/17 1939  GLUCAP 115* 118* 222*   Lipid Profile: No results for input(s): CHOL, HDL, LDLCALC, TRIG, CHOLHDL, LDLDIRECT in the last 72 hours. Thyroid Function Tests: No results for input(s): TSH, T4TOTAL, FREET4, T3FREE, THYROIDAB in the last  72 hours. Anemia Panel: No results for input(s): VITAMINB12, FOLATE, FERRITIN, TIBC, IRON, RETICCTPCT in the last 72 hours. Urine analysis:    Component Value Date/Time   COLORURINE YELLOW 01/25/2017 1814   APPEARANCEUR CLEAR 01/25/2017 1814   LABSPEC 1.008 01/25/2017 1814   PHURINE 6.0 01/25/2017 1814   GLUCOSEU NEGATIVE 01/25/2017 1814   HGBUR NEGATIVE 01/25/2017 1814   BILIRUBINUR NEGATIVE 01/25/2017 1814   KETONESUR NEGATIVE 01/25/2017 1814   PROTEINUR NEGATIVE 01/25/2017 1814   UROBILINOGEN 0.2 05/10/2013 1015   NITRITE NEGATIVE 01/25/2017 1814   LEUKOCYTESUR NEGATIVE 01/25/2017 1814   Sepsis Labs: @LABRCNTIP (procalcitonin:4,lacticidven:4)  ) Recent Results (from the past 240 hour(s))  Surgical pcr screen     Status: None   Collection Time: 02/08/17 10:41 PM  Result Value Ref Range Status   MRSA, PCR NEGATIVE NEGATIVE Final   Staphylococcus aureus NEGATIVE NEGATIVE Final    Comment: (NOTE) The Xpert SA Assay (FDA approved for NASAL  specimens in patients 66 years of age and older), is one component of a comprehensive surveillance program. It is not intended to diagnose infection nor to guide or monitor treatment.       Radiology Studies: Dg Abd Portable 1v  Result Date: 02/10/2017 CLINICAL DATA:  Nausea since early this morning EXAM: PORTABLE ABDOMEN - 1 VIEW COMPARISON:  Portable exam 1238 hours without priors for comparison FINDINGS: Nonobstructive bowel gas pattern. No bowel dilatation or bowel wall thickening. Scattered gas and stool throughout colon to rectum. Bones demineralized with degenerative changes and dextroconvex scoliosis lumbar spine. Scattered atherosclerotic calcifications. IMPRESSION: Normal bowel gas pattern. Electronically Signed   By: Lavonia Dana M.D.   On: 02/10/2017 12:59     Scheduled Meds: . cholecalciferol  1,000 Units Oral Daily  . docusate sodium  100 mg Oral BID  . doxazosin  8 mg Oral BID  . mycophenolate  360 mg Oral BID  . pantoprazole  40 mg Oral Daily  . pneumococcal 23 valent vaccine  0.5 mL Intramuscular Tomorrow-1000  . polyethylene glycol  17 g Oral Daily  . predniSONE  5 mg Oral Q breakfast  . senna-docusate  1 tablet Oral BID  . sodium chloride flush  3 mL Intravenous Q12H  . tacrolimus  1 mg Oral BID   Continuous Infusions: . sodium chloride 250 mL (02/09/17 0439)  . sodium chloride 100 mL/hr at 02/11/17 0306  . heparin 1,100 Units/hr (02/11/17 0847)     LOS: 5 days   Time Spent in minutes   30 minutes Author:  Berle Mull, MD [facility] Pager: [phone] 02/11/2017 11:25 AM     After 7pm go to [email or web address] - password [email or web address]  And look for the night coverage person covering for me after hours  [facility] Office  [phone]

## 2017-02-11 NOTE — Evaluation (Signed)
Physical Therapy Evaluation Patient Details Name: Jeffrey Perez MRN: [id number] DOB: [birth year] Today's Date: 02/11/2017   History of Present Illness  71 yo male admitted for L facial droop, difficulty walking, dizziness.  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  PMHx:  lumbar scoliosis, atherosclerosis, osteoporosis, AKI, gout, poor sleep, kidney transplant patient, anemia, HTN, PAF, gout, cardiomegaly,   Clinical Impression  Pt has been attempted to stand but due to sudden onset of nausea he was unable to get past EOB.  He is nearly vomiting and notified nursing to assist with meds, obtained emesis bags for him with repositioning on the bed for comfort.  He is ready to be seen by PT as symptoms allow, and due to having family at home will need to be supervised at all times for mobility to be safe unless PT can clear him on a further acute visit before dc home to do more.    Follow Up Recommendations Home health PT;Supervision for mobility/OOB    Equipment Recommendations  Rolling walker with 5" wheels    Recommendations for Other Services       Precautions / Restrictions Precautions Precautions: Fall(telemetry, foley) Restrictions Weight Bearing Restrictions: No      Mobility  Bed Mobility Overal bed mobility: Needs Assistance Bed Mobility: Supine to Sit;Sit to Supine     Supine to sit: Supervision Sit to supine: Min assist   General bed mobility comments: assisted to bed due to sudden onset nausea  Transfers                 General transfer comment: unable to stand due to potential to vomit  Ambulation/Gait                Stairs            Wheelchair Mobility    Modified Rankin (Stroke Patients Only)       Balance Overall balance assessment: Needs assistance Sitting-balance support: Feet supported Sitting balance-Leahy Scale: Fair                                        Pertinent Vitals/Pain Pain Assessment: Faces Faces Pain Scale: Hurts even more Pain Location: HA Pain Descriptors / Indicators: Headache Pain Intervention(s): Monitored during session;Repositioned;Other (comment)(nauseated, informed nursing)    Home Living Family/patient expects to be discharged to:: Unsure Living Arrangements: Alone;Spouse/significant other Available Help at Discharge: Family;Available 24 hours/day Type of Home: House Home Access: Level entry     Home Layout: One level Home Equipment: None      Prior Function Level of Independence: Independent(PT had assigned a RW last time which pt did not use)               Hand Dominance   Dominant Hand: Right    Extremity/Trunk Assessment   Upper Extremity Assessment Upper Extremity Assessment: Overall WFL for tasks assessed    Lower Extremity Assessment Lower Extremity Assessment: Overall WFL for tasks assessed    Cervical / Trunk Assessment Cervical / Trunk Assessment: Normal  Communication   Communication: No difficulties(accent but speaks [place])  Cognition Arousal/Alertness: Awake/alert Behavior During Therapy: WFL for tasks assessed/performed Overall Cognitive Status: Within Functional Limits for tasks assessed  General Comments      Exercises     Assessment/Plan    PT Assessment Patient needs continued PT services  PT Problem List Decreased activity tolerance;Decreased balance;Decreased mobility;Decreased knowledge of use of DME;Decreased safety awareness;Decreased skin integrity;Pain       PT Treatment Interventions DME instruction;Gait training;Functional mobility training;Therapeutic activities;Therapeutic exercise;Balance training;Neuromuscular re-education;Patient/family education    PT Goals (Current goals can be found in the Care Plan section)  Acute Rehab PT Goals Patient Stated Goal: none stated PT Goal Formulation:  With patient Time For Goal Achievement: 02/25/17 Potential to Achieve Goals: Good    Frequency Min 2X/week   Barriers to discharge Decreased caregiver support home alone at times    Co-evaluation               AM-PAC PT "6 Clicks" Daily Activity  Outcome Measure Difficulty turning over in bed (including adjusting bedclothes, sheets and blankets)?: A Little Difficulty moving from lying on back to sitting on the side of the bed? : A Little Difficulty sitting down on and standing up from a chair with arms (e.g., wheelchair, bedside commode, etc,.)?: A Little Help needed moving to and from a bed to chair (including a wheelchair)?: A Little Help needed walking in hospital room?: A Little Help needed climbing 3-5 steps with a railing? : A Lot 6 Click Score: 17    End of Session   Activity Tolerance: Treatment limited secondary to medical complications (Comment)(pt was nauseated and tried to vomit) Patient left: in bed;with call bell/phone within reach;with bed alarm set;with family/visitor present Nurse Communication: Mobility status;Other (comment)(asked nursing for anti-nausea meds) PT Visit Diagnosis: Muscle weakness (generalized) (M62.81);Unsteadiness on feet (R26.81);Difficulty in walking, not elsewhere classified (R26.2);Pain Pain - Right/Left: Right Pain - part of body: (HA)    Time: 1607-3710 PT Time Calculation (min) (ACUTE ONLY): 21 min   Charges:   PT Evaluation $PT Eval Moderate Complexity: 1 Mod     PT G Codes:   PT G-Codes **NOT FOR INPATIENT CLASS** Functional Assessment Tool Used: AM-PAC 6 Clicks Basic Mobility    Ramond Dial 02/11/2017, 2:46 PM   Mee Hives, PT MS Acute Rehab Dept. Number: [facility] [place]

## 2017-02-12 LAB — BASIC METABOLIC PANEL
Anion gap: 6 (ref 5–15)
BUN: 18 mg/dL (ref 6–20)
CO2: 23 mmol/L (ref 22–32)
CREATININE: 1.22 mg/dL (ref 0.61–1.24)
Calcium: 8.5 mg/dL — ABNORMAL LOW (ref 8.9–10.3)
Chloride: 109 mmol/L (ref 101–111)
GFR calc non Af Amer: 57 mL/min — ABNORMAL LOW (ref >60)
Glucose, Bld: 103 mg/dL — ABNORMAL HIGH (ref 65–99)
POTASSIUM: 3.7 mmol/L (ref 3.5–5.1)
Sodium: 138 mmol/L (ref 135–145)

## 2017-02-12 LAB — PROTIME-INR
INR: 1.2
Prothrombin Time: 15.1 seconds (ref 11.4–15.2)

## 2017-02-12 LAB — CBC
HCT: 29.6 % — ABNORMAL LOW (ref 39.0–52.0)
Hemoglobin: 9.3 g/dL — ABNORMAL LOW (ref 13.0–17.0)
MCH: 28.4 pg (ref 26.0–34.0)
MCHC: 31.4 g/dL (ref 30.0–36.0)
MCV: 90.2 fL (ref 78.0–100.0)
Platelets: 89 10*3/uL — ABNORMAL LOW (ref 150–400)
RBC: 3.28 MIL/uL — AB (ref 4.22–5.81)
RDW: 14.4 % (ref 11.5–15.5)
WBC: 3.2 10*3/uL — ABNORMAL LOW (ref 4.0–10.5)

## 2017-02-12 LAB — HEPARIN LEVEL (UNFRACTIONATED)
HEPARIN UNFRACTIONATED: 0.51 [IU]/mL (ref 0.30–0.70)
Heparin Unfractionated: 0.39 IU/mL (ref 0.30–0.70)

## 2017-02-12 MED ORDER — MORPHINE SULFATE (PF) 2 MG/ML IV SOLN
2.0000 mg | Freq: Once | INTRAVENOUS | Status: AC
  Administered 2017-02-12: 2 mg via INTRAVENOUS
  Filled 2017-02-12: qty 1

## 2017-02-12 MED ORDER — WARFARIN - PHARMACIST DOSING INPATIENT
Freq: Every day | Status: DC
  Administered 2017-02-14 – 2017-02-15 (×2)
  Filled 2017-02-12: qty 1

## 2017-02-12 MED ORDER — DEXAMETHASONE SODIUM PHOSPHATE 10 MG/ML IJ SOLN
10.0000 mg | Freq: Once | INTRAMUSCULAR | Status: AC
  Administered 2017-02-12: 10 mg via INTRAVENOUS
  Filled 2017-02-12: qty 1

## 2017-02-12 MED ORDER — WARFARIN SODIUM 6 MG PO TABS
6.0000 mg | ORAL_TABLET | Freq: Once | ORAL | Status: AC
  Administered 2017-02-12: 6 mg via ORAL
  Filled 2017-02-12: qty 1

## 2017-02-12 NOTE — Progress Notes (Signed)
PROGRESS NOTE    Jeffrey Perez  YOV:[id number] DOB: [birth year] DOA: 02/04/2017 PCP: System, Pcp Not In   Chief Complaint  Patient presents with  . Facial Droop    Brief Narrative:  HPI on 02/04/2017 Jeffrey Perez is a 71 y.o. male with a medical history of renal transplant/end-stage renal disease, gout, hypertension, anemia, who presented to the emergency department with complaints of left facial droop and concern for stroke. Patient was recently admitted and discharged on 01/28/2017 for generalized weakness. Patient reports that since leaving the hospital last week, he started having some left-sided weakness of his face. Family friend present at bedside, reported that she noticed it was worse today. Patient also reported some difficulty walking and dizziness. Denies any recent illness, travel. Denies chest pain, shortness of breath, abdominal pain, nausea vomiting, diarrhea or constipation, changes in urinary or bowel habits.   Assessment & Plan   Left facial droop/Brain lesion -CT head: Abnormal appearance of both anterior inferior frontal lobes and anterior corpus callosum, resembling a tumor which has crossed midline rather than infarct.  -MRI with and without contrast: Heterogeneously enhancing mass lesion in the anterior inferior frontal lobes crosses midline, measures 5.3 x 3.6 x 4.0 cm -Neurology consulted and appreciated -Neurosurgery consulted and appreciated,  -Interventional radiology consulted and appreciated, however no longer needed lumbar puncture -Continue eye drops as he complains that his left eye is burning (as he cannot blink) - S/p brain biopsy, GBM, neuro-oncology consulted.  Awaiting further recommendation.  Patient will be discussed in tumor board on Monday, 02/13/2017  History of renal transplant/CKD -Transplant occurred 25 years ago, no longer on dialysis -Unknown baseline creatinine, currently  getting better -Continue monitor BMP -Continue mycophenolate,  prednisone, tacrolimus  Paroxysmal atrial fibrillation -Currently in sinus rhythm -CHADSVASC 2 (based on age and HTN) -Perioperatively Coumadin and heparin was on hold. -Given history of A. fib as well as presence of tumor, patient remains at high risk for thrombogenic event and therefore bridging is recommended. Has thrombocytopenia, chronic kidney disease and recent CNS surgery. Monitor in the hospital while the patient is bridged with IV heparin and Coumadin  Chronic anemia/thrombocytopenia -Hemoglobin and platelets appears to be at baseline -Continue monitor CBC  Essential hypertension -Continue Cardura  Nausea and vomiting. Likely from constipation.  Continue stool softener. Patient still has reoccurrence of nausea and vomiting only in the morning. Inform the patient to notify the staff if it continues to get worse.  DVT Prophylaxis on heparin right now,   Code Status: Full  Family Communication: None at bedside  Disposition Plan: PT OT recommends home with home health with a rolling walker.  Will discuss with neuro oncology tomorrow after tumor board.  If the patient can be transitioned to subcu Lovenox and Coumadin for bridging.  If no further workup and treatment planned in the hospital should be able to discharge the patient tomorrow.  Consultants Neurology Neurosurgery Interventional radiology Neuro-oncology   Procedures  Stereotactic brain biopsy  Antibiotics   Anti-infectives (From admission, onward)   Start     Dose/Rate Route Frequency Ordered Stop   02/09/17 2030  ceFAZolin (ANCEF) IVPB 2g/100 mL premix     2 g 200 mL/hr over 30 Minutes Intravenous Every 8 hours 02/09/17 1457 02/10/17 0505   02/09/17 0800  ceFAZolin (ANCEF) IVPB 2g/100 mL premix     2 g 200 mL/hr over 30 Minutes Intravenous To ShortStay Surgical 02/08/17 2226 02/09/17 1205   02/09/17 0600  ceFAZolin (ANCEF) IVPB 2g/100 mL premix  Status:  Discontinued     2 g 200 mL/hr over 30  Minutes Intravenous On call to O.R. 02/08/17 2231 02/09/17 0738      Subjective:   Jeffrey Perez seen and examined today.  Had some nausea and vomiting earlier.  Now resolved.  Other acute complaints.  Objective:   Vitals:   02/11/17 2111 02/12/17 0157 02/12/17 0447 02/12/17 1033  BP: (!) 148/67 136/71 (!) 141/73 (!) 160/73  Pulse: (!) 57 (!) 57 (!) 54 (!) 57  Resp: 16 16 16 18   Temp: 97.9 F (36.6 C) 97.9 F (36.6 C) 97.9 F (36.6 C) 98.9 F (37.2 C)  TempSrc: Oral Oral Oral Oral  SpO2: 99% 98% 98% 98%  Weight:      Height:        Intake/Output Summary (Last 24 hours) at 02/12/2017 1510 Last data filed at 02/12/2017 0451 Gross per 24 hour  Intake 1330 ml  Output 1500 ml  Net -170 ml   Filed Weights   02/06/17 1344 02/09/17 1500 02/11/17 0623  Weight: 66 kg (145 lb 8.1 oz) 68.2 kg (150 lb 5.7 oz) 70.9 kg (156 lb 3.2 oz)   Exam (no change from exam on 02/06/2017)  General: Well developed, well nourished, NAD, appears stated age  51: NCAT, mucous membranes moist. Left facial droop  Cardiovascular: S1 S2 auscultated, RRR, +SEM  Respiratory: Clear to auscultation bilaterally with equal chest rise  Abdomen: Soft, nontender, nondistended, + bowel sounds  Extremities: warm dry without cyanosis clubbing or edema  Neuro: AAOx3, left hemianopsia and facial weakness, otherwise nonfocal  Psych: Normal affect and demeanor with intact judgement and insight  Data Reviewed: I have personally reviewed following labs and imaging studies  CBC: Recent Labs  Lab 02/08/17 0444 02/08/17 2342 02/10/17 0438 02/11/17 0427 02/12/17 0348  WBC 4.6 4.4 4.8 3.3* 3.2*  HGB 9.8* 10.2* 9.1* 9.2* 9.3*  HCT 31.3* 32.5* 28.6* 29.8* 29.6*  MCV 90.5 90.3 90.2 90.9 90.2  PLT 101* 100* 97* 87* 89*   Basic Metabolic Panel: Recent Labs  Lab 02/08/17 0444 02/08/17 2342 02/10/17 0438 02/11/17 0427 02/12/17 0348  NA 138 138 137 140 138  K 3.7 3.9 4.2 3.9 3.7  CL 109 110 110 113*  109  CO2 21* 23 20* 22 23  GLUCOSE 86 100* 118* 85 103*  BUN 41* 37* 28* 25* 18  CREATININE 1.79* 1.69* 1.52* 1.40* 1.22  CALCIUM 8.8* 8.8* 8.3* 8.5* 8.5*  MG  --  1.9 1.7  --   --    GFR: Estimated Creatinine Clearance: 48.7 mL/min (by C-G formula based on SCr of 1.22 mg/dL). Liver Function Tests: No results for input(s): AST, ALT, ALKPHOS, BILITOT, PROT, ALBUMIN in the last 168 hours. No results for input(s): LIPASE, AMYLASE in the last 168 hours. No results for input(s): AMMONIA in the last 168 hours. Coagulation Profile: Recent Labs  Lab 02/08/17 0444 02/08/17 2342 02/10/17 0438 02/11/17 0427 02/12/17 0348  INR 1.65 1.42 1.18 1.18 1.20   Cardiac Enzymes: No results for input(s): CKTOTAL, CKMB, CKMBINDEX, TROPONINI in the last 168 hours. BNP (last 3 results) No results for input(s): PROBNP in the last 8760 hours. HbA1C: No results for input(s): HGBA1C in the last 72 hours. CBG: Recent Labs  Lab 02/09/17 1602 02/09/17 1939  GLUCAP 118* 222*   Lipid Profile: No results for input(s): CHOL, HDL, LDLCALC, TRIG, CHOLHDL, LDLDIRECT in the last 72 hours. Thyroid Function Tests: No results for input(s): TSH, T4TOTAL, FREET4, T3FREE, THYROIDAB in  the last 72 hours. Anemia Panel: No results for input(s): VITAMINB12, FOLATE, FERRITIN, TIBC, IRON, RETICCTPCT in the last 72 hours. Urine analysis:    Component Value Date/Time   COLORURINE YELLOW 01/25/2017 1814   APPEARANCEUR CLEAR 01/25/2017 1814   LABSPEC 1.008 01/25/2017 1814   PHURINE 6.0 01/25/2017 1814   GLUCOSEU NEGATIVE 01/25/2017 1814   HGBUR NEGATIVE 01/25/2017 1814   BILIRUBINUR NEGATIVE 01/25/2017 1814   KETONESUR NEGATIVE 01/25/2017 1814   PROTEINUR NEGATIVE 01/25/2017 1814   UROBILINOGEN 0.2 05/10/2013 1015   NITRITE NEGATIVE 01/25/2017 1814   LEUKOCYTESUR NEGATIVE 01/25/2017 1814   Sepsis Labs: @LABRCNTIP (procalcitonin:4,lacticidven:4)  ) Recent Results (from the past 240 hour(s))  Surgical pcr  screen     Status: None   Collection Time: 02/08/17 10:41 PM  Result Value Ref Range Status   MRSA, PCR NEGATIVE NEGATIVE Final   Staphylococcus aureus NEGATIVE NEGATIVE Final    Comment: (NOTE) The Xpert SA Assay (FDA approved for NASAL specimens in patients 42 years of age and older), is one component of a comprehensive surveillance program. It is not intended to diagnose infection nor to guide or monitor treatment.       Radiology Studies: No results found.   Scheduled Meds: . cholecalciferol  1,000 Units Oral Daily  . docusate sodium  100 mg Oral BID  . doxazosin  8 mg Oral BID  . mycophenolate  360 mg Oral BID  . pantoprazole  40 mg Oral Daily  . pneumococcal 23 valent vaccine  0.5 mL Intramuscular Tomorrow-1000  . polyethylene glycol  17 g Oral Daily  . predniSONE  5 mg Oral Q breakfast  . senna-docusate  1 tablet Oral BID  . sodium chloride flush  3 mL Intravenous Q12H  . tacrolimus  1 mg Oral BID  . warfarin  6 mg Oral ONCE-1800  . Warfarin - Pharmacist Dosing Inpatient   Does not apply q1800   Continuous Infusions: . sodium chloride 250 mL (02/09/17 0439)  . heparin 1,250 Units/hr (02/12/17 0458)     LOS: 6 days   Time Spent in minutes   30 minutes Author:  Berle Mull, MD [facility] Pager: [phone] 02/12/2017 3:10 PM     After 7pm go to [email or web address] - password [email or web address]  And look for the night coverage person covering for me after hours  [facility] Office  [phone]

## 2017-02-12 NOTE — Progress Notes (Signed)
Pt /o of severe headache rated 8/10 on pains scale of 1-10  radiating to back of neck and spine. Pt had a projectile  vomit x 1 and other episode of emesis during the day, medicated with vicodin po and pt vomited immediately after the po medication.  MD  On call for neuro surgery  Made aware, orders received. RN will continue to monitor. Pt.

## 2017-02-12 NOTE — Progress Notes (Signed)
ANTICOAGULATION CONSULT NOTE - Follow Up Consult  Pharmacy Consult for Heparin Indication: atrial fibrillation  No Known Allergies  Patient Measurements: Height: 5\' 6"  (167.6 cm) Weight: 156 lb 3.2 oz (70.9 kg) IBW/kg (Calculated) : 63.8 Heparin Dosing Weight: 68 kg  Labs: Recent Labs    02/10/17 0438 02/11/17 0427 02/11/17 1552 02/12/17 0348  HGB 9.1* 9.2*  --  9.3*  HCT 28.6* 29.8*  --  29.6*  PLT 97* 87*  --  89*  LABPROT 14.9 14.9  --  15.1  INR 1.18 1.18  --  1.20  HEPARINUNFRC  --   --  0.18* 0.39  CREATININE 1.52* 1.40*  --  1.22   Assessment: 71 year old male seen in ED on 02/04/17 with facial drop- had been worsening over last several days to 1 week. Stroke ruled out. Imaging showed mass in frontal lobe and lesions in 4th ventricle.   Warfarin for Afib on hold. Patient was transitioned to heparin infusion which was stopped for the biopsy on 11/8, no back on,  Heparin level therapeutic x 1 this AM   Goal of Therapy:  Heparin level 0.3-0.7 units/ml Monitor platelets by anticoagulation protocol: Yes   Plan:  Cont heparin at 1250 units/hr 1200 HL  Narda Bonds, PharmD, BCPS Clinical Pharmacist Phone: [phone]

## 2017-02-12 NOTE — Progress Notes (Signed)
Patient complaining of nausea and has had one episode of vomiting. Given prn Zofran for comfort. Will reassess symptoms. Wendee Copp

## 2017-02-12 NOTE — Progress Notes (Addendum)
ANTICOAGULATION CONSULT NOTE - Follow Up Consult  Pharmacy Consult for Heparin and warfarin Indication: atrial fibrillation  No Known Allergies  Patient Measurements: Height: 5\' 6"  (167.6 cm) Weight: 156 lb 3.2 oz (70.9 kg) IBW/kg (Calculated) : 63.8 Heparin Dosing Weight: 68 kg  Labs: Recent Labs    02/10/17 0438 02/11/17 0427 02/11/17 1552 02/12/17 0348 02/12/17 1219  HGB 9.1* 9.2*  --  9.3*  --   HCT 28.6* 29.8*  --  29.6*  --   PLT 97* 87*  --  89*  --   LABPROT 14.9 14.9  --  15.1  --   INR 1.18 1.18  --  1.20  --   HEPARINUNFRC  --   --  0.18* 0.39 0.51  CREATININE 1.52* 1.40*  --  1.22  --    Assessment: 71 year old male seen in ED on 02/04/17 with facial drop- had been worsening over last several days to 1 week. Stroke ruled out. Imaging showed mass in frontal lobe and lesions in 4th ventricle. Heparin drip was resumed on 11/10 around 0800. Heparin level this afternoon is at goal at 0.51. Pharmacy to begin bridging back to PTA warfarin therapy and will resume warfarin tonight. INR today was 1.20 (no recent warfarin doses). CBC is low, but stable at this time. No concerns with heparin drip or bleeding noted at this time.   Warfarin PTA dose is 6mg  daily.   Goal of Therapy:  Heparin level 0.3-0.7 units/ml Monitor platelets by anticoagulation protocol: Yes   Plan:  Cont heparin at 1250 units/hr Warfarin 6mg  PO x 1 dose  Daily HL, CBC, and INR  Monitor for signs and symptoms of bleeding   Jalene Mullet, Pharm.D. PGY1 Pharmacy Resident 02/12/2017 1:46 PM Main Pharmacy: [phone]

## 2017-02-13 ENCOUNTER — Inpatient Hospital Stay (HOSPITAL_COMMUNITY): Payer: Medicare Other

## 2017-02-13 LAB — CBC
HCT: 27.8 % — ABNORMAL LOW (ref 39.0–52.0)
HEMATOCRIT: 32.7 % — AB (ref 39.0–52.0)
HEMOGLOBIN: 10.2 g/dL — AB (ref 13.0–17.0)
Hemoglobin: 8.8 g/dL — ABNORMAL LOW (ref 13.0–17.0)
MCH: 28.3 pg (ref 26.0–34.0)
MCH: 28.4 pg (ref 26.0–34.0)
MCHC: 31.2 g/dL (ref 30.0–36.0)
MCHC: 31.7 g/dL (ref 30.0–36.0)
MCV: 90.8 fL (ref 78.0–100.0)
MCV: 89.7 fL (ref 78.0–100.0)
PLATELETS: 90 10*3/uL — AB (ref 150–400)
Platelets: 106 10*3/uL — ABNORMAL LOW (ref 150–400)
RBC: 3.6 MIL/uL — AB (ref 4.22–5.81)
RBC: 3.1 MIL/uL — AB (ref 4.22–5.81)
RDW: 14.5 % (ref 11.5–15.5)
RDW: 14.5 % (ref 11.5–15.5)
WBC: 4.2 10*3/uL (ref 4.0–10.5)
WBC: 3.5 10*3/uL — AB (ref 4.0–10.5)

## 2017-02-13 LAB — HEPARIN LEVEL (UNFRACTIONATED)
Heparin Unfractionated: 1.07 IU/mL — ABNORMAL HIGH (ref 0.30–0.70)
Heparin Unfractionated: 0.47 IU/mL (ref 0.30–0.70)

## 2017-02-13 LAB — BASIC METABOLIC PANEL
ANION GAP: 7 (ref 5–15)
BUN: 22 mg/dL — ABNORMAL HIGH (ref 6–20)
CALCIUM: 8.5 mg/dL — AB (ref 8.9–10.3)
CO2: 21 mmol/L — ABNORMAL LOW (ref 22–32)
Chloride: 107 mmol/L (ref 101–111)
Creatinine, Ser: 1.79 mg/dL — ABNORMAL HIGH (ref 0.61–1.24)
GFR calc non Af Amer: 36 mL/min — ABNORMAL LOW (ref >60)
GFR, EST AFRICAN AMERICAN: 42 mL/min — AB (ref >60)
Glucose, Bld: 154 mg/dL — ABNORMAL HIGH (ref 65–99)
POTASSIUM: 4.7 mmol/L (ref 3.5–5.1)
Sodium: 135 mmol/L (ref 135–145)

## 2017-02-13 LAB — PROTIME-INR
INR: 1.22
PROTHROMBIN TIME: 15.3 s — AB (ref 11.4–15.2)

## 2017-02-13 MED ORDER — WARFARIN SODIUM 6 MG PO TABS
6.0000 mg | ORAL_TABLET | Freq: Once | ORAL | Status: AC
  Administered 2017-02-13: 6 mg via ORAL
  Filled 2017-02-13: qty 1

## 2017-02-13 MED ORDER — SODIUM CHLORIDE 0.9 % IV SOLN
INTRAVENOUS | Status: DC
  Administered 2017-02-13 (×2): via INTRAVENOUS

## 2017-02-13 MED ORDER — ENOXAPARIN (LOVENOX) PATIENT EDUCATION KIT
PACK | Freq: Once | Status: DC
  Filled 2017-02-13: qty 1

## 2017-02-13 MED ORDER — DOXAZOSIN MESYLATE 8 MG PO TABS
4.0000 mg | ORAL_TABLET | Freq: Two times a day (BID) | ORAL | Status: DC
  Administered 2017-02-13 – 2017-02-16 (×6): 4 mg via ORAL
  Filled 2017-02-13 (×6): qty 1

## 2017-02-13 MED ORDER — HEPARIN (PORCINE) IN NACL 100-0.45 UNIT/ML-% IJ SOLN
1200.0000 [IU]/h | INTRAMUSCULAR | Status: DC
  Administered 2017-02-13 (×2): 1100 [IU]/h via INTRAVENOUS
  Administered 2017-02-14 – 2017-02-15 (×2): 1200 [IU]/h via INTRAVENOUS
  Filled 2017-02-13 (×3): qty 250

## 2017-02-13 NOTE — Progress Notes (Signed)
PROGRESS NOTE    Dutch Ing  FXT:[id number] DOB: [birth year] DOA: 02/04/2017 PCP: System, Pcp Not In   Chief Complaint  Patient presents with  . Facial Droop    Brief Narrative:  HPI on 02/04/2017 Jeffrey Perez is a 71 y.o. male with a medical history of renal transplant/end-stage renal disease, gout, hypertension, anemia, who presented to the emergency department with complaints of left facial droop and concern for stroke. Patient was recently admitted and discharged on 01/28/2017 for generalized weakness. Patient reports that since leaving the hospital last week, he started having some left-sided weakness of his face. Family friend present at bedside, reported that she noticed it was worse today. Patient also reported some difficulty walking and dizziness. Denies any recent illness, travel. Denies chest pain, shortness of breath, abdominal pain, nausea vomiting, diarrhea or constipation, changes in urinary or bowel habits.   Assessment & Plan   Glioblastoma multi-forming. S/P stereotactic brain biopsy. Left facial droop -CT head: Abnormal appearance of both anterior inferior frontal lobes and anterior corpus callosum, resembling a tumor which has crossed midline rather than infarct.  -MRI with and without contrast: Heterogeneously enhancing mass lesion in the anterior inferior frontal lobes crosses midline, measures 5.3 x 3.6 x 4.0 cm -Neurology consulted and appreciated -Neurosurgery consulted and appreciated,  -Interventional radiology consulted and appreciated, however no longer needed lumbar puncture -Continue eye drops as he complains that his left eye is burning (as he cannot blink) - S/p brain biopsy, GBM, neuro-oncology consulted. Neuro oncology will follow-up with patient tomorrow.  Difficult situation since the patient is contemplating to move back to [place].  History of renal transplant/CKD -Transplant occurred 25 years ago, no longer on dialysis -Unknown baseline  creatinine, worsened today, will give IV hydration overnight and monitor tomorrow morning. -Continue monitor BMP -Continue mycophenolate, prednisone, tacrolimus  Paroxysmal atrial fibrillation -Currently in sinus rhythm -CHADSVASC 2 (based on age and HTN) -Perioperatively Coumadin and heparin was on hold. -Given history of A. fib as well as presence of tumor, patient remains at high risk for thrombogenic event and therefore bridging is recommended. Has thrombocytopenia, chronic kidney disease and recent CNS surgery. Monitor in the hospital while the patient is bridged with IV heparin and Coumadin If his renal function remains stable patient can be transitioned to Lovenox 70 mg daily and hopefully can be discharged home on Coumadin bridge. Patient will need INR follow-up on Friday if discharged tomorrow, case manager consulted to arrange that. Patient had appointment with [place] PCP on 02/06/2017 before coming to the hospital.  Chronic anemia/thrombocytopenia -Hemoglobin and platelets appears to be at baseline -Continue monitor CBC  Essential hypertension -Continue Cardura, due to hypotension dose reduced from 8 mg to 4 mg twice daily.  Nausea and vomiting. Likely from constipation.  Continue stool softener. Patient still has reoccurrence of nausea and vomiting only in the morning. Inform the patient to notify the staff if it continues to get worse.  DVT Prophylaxis on heparin right now,   Code Status: Full  Family Communication: None at bedside  Disposition Plan: PT OT recommends home with home health with a rolling walker.  Likely home tomorrow if can be switched to Lovenox.  Consultants Neurology Neurosurgery Interventional radiology Neuro-oncology   Procedures  Stereotactic brain biopsy  Antibiotics   Anti-infectives (From admission, onward)   Start     Dose/Rate Route Frequency Ordered Stop   02/09/17 2030  ceFAZolin (ANCEF) IVPB 2g/100 mL premix     2 g 200  mL/hr over  30 Minutes Intravenous Every 8 hours 02/09/17 1457 02/10/17 0505   02/09/17 0800  ceFAZolin (ANCEF) IVPB 2g/100 mL premix     2 g 200 mL/hr over 30 Minutes Intravenous To [facility]Stay [facility] 02/08/17 2226 02/09/17 1205   02/09/17 0600  ceFAZolin (ANCEF) IVPB 2g/100 mL premix  Status:  Discontinued     2 g 200 mL/hr over 30 Minutes Intravenous On call to O.R. 02/08/17 2231 02/09/17 0738      Subjective:   N[place] Pollack seen and examined today.  Had some nausea and vomiting earlier.  Now resolved.  Other acute complaints.  Objective:   Vitals:   02/13/17 0630 02/13/17 0929 02/13/17 1437 02/13/17 1706  BP: 115/76 (!) 90/55 112/63 (!) 141/69  Pulse: 82 90 72 69  Resp:   18 18  Temp: 98.1 F (36.7 C) 98.3 F (36.8 C) 98.7 F (37.1 C) 98.5 F (36.9 C)  TempSrc:  Oral Oral Oral  SpO2:  96% 96% 96%  Weight:      Height:        Intake/Output Summary (Last 24 hours) at 02/13/2017 1834 Last data filed at 02/13/2017 1100 Gross per 24 hour  Intake 610 ml  Output -  Net 610 ml   Filed Weights   02/06/17 1344 02/09/17 1500 02/11/17 0623  Weight: 66 kg (145 lb 8.1 oz) 68.2 kg (150 lb 5.7 oz) 70.9 kg (156 lb 3.2 oz)   Exam (no change from exam on 02/06/2017)  General: Well developed, well nourished, NAD, appears stated age  71: NCAT, mucous membranes moist. Left facial droop  Cardiovascular: S1 S2 auscultated, RRR, +SEM  Respiratory: Clear to auscultation bilaterally with equal chest rise  Abdomen: Soft, nontender, nondistended, + bowel sounds  Extremities: warm dry without cyanosis clubbing or edema  Neuro: AAOx3, left hemianopsia and facial weakness, otherwise nonfocal  Psych: Normal affect and demeanor with intact judgement and insight  Data Reviewed: I have personally reviewed following labs and imaging studies  CBC: Recent Labs  Lab 02/08/17 2342 02/10/17 0438 02/11/17 0427 02/12/17 0348 02/13/17 0955  WBC 4.4 4.8 3.3* 3.2* 4.2  HGB 10.2* 9.1*  9.2* 9.3* 10.2*  HCT 32.5* 28.6* 29.8* 29.6* 32.7*  MCV 90.3 90.2 90.9 90.2 90.8  PLT 100* 97* 87* 89* 696*   Basic Metabolic Panel: Recent Labs  Lab 02/08/17 2342 02/10/17 0438 02/11/17 0427 02/12/17 0348 02/13/17 0955  NA 138 137 140 138 135  K 3.9 4.2 3.9 3.7 4.7  CL 110 110 113* 109 107  CO2 23 20* 22 23 21*  GLUCOSE 100* 118* 85 103* 154*  BUN 37* 28* 25* 18 22*  CREATININE 1.69* 1.52* 1.40* 1.22 1.79*  CALCIUM 8.8* 8.3* 8.5* 8.5* 8.5*  MG 1.9 1.7  --   --   --    GFR: Estimated Creatinine Clearance: 33.2 mL/min (A) (by C-G formula based on SCr of 1.79 mg/dL (H)). Liver Function Tests: No results for input(s): AST, ALT, ALKPHOS, BILITOT, PROT, ALBUMIN in the last 168 hours. No results for input(s): LIPASE, AMYLASE in the last 168 hours. No results for input(s): AMMONIA in the last 168 hours. Coagulation Profile: Recent Labs  Lab 02/08/17 2342 02/10/17 0438 02/11/17 0427 02/12/17 0348 02/13/17 0955  INR 1.42 1.18 1.18 1.20 1.22   Cardiac Enzymes: No results for input(s): CKTOTAL, CKMB, CKMBINDEX, TROPONINI in the last 168 hours. BNP (last 3 results) No results for input(s): PROBNP in the last 8760 hours. HbA1C: No results for input(s): HGBA1C in the last  72 hours. CBG: Recent Labs  Lab 02/09/17 1602 02/09/17 1939  GLUCAP 118* 222*   Lipid Profile: No results for input(s): CHOL, HDL, LDLCALC, TRIG, CHOLHDL, LDLDIRECT in the last 72 hours. Thyroid Function Tests: No results for input(s): TSH, T4TOTAL, FREET4, T3FREE, THYROIDAB in the last 72 hours. Anemia Panel: No results for input(s): VITAMINB12, FOLATE, FERRITIN, TIBC, IRON, RETICCTPCT in the last 72 hours. Urine analysis:    Component Value Date/Time   COLORURINE YELLOW 01/25/2017 1814   APPEARANCEUR CLEAR 01/25/2017 1814   LABSPEC 1.008 01/25/2017 1814   PHURINE 6.0 01/25/2017 1814   GLUCOSEU NEGATIVE 01/25/2017 1814   HGBUR NEGATIVE 01/25/2017 1814   BILIRUBINUR NEGATIVE 01/25/2017 1814    KETONESUR NEGATIVE 01/25/2017 1814   PROTEINUR NEGATIVE 01/25/2017 1814   UROBILINOGEN 0.2 05/10/2013 1015   NITRITE NEGATIVE 01/25/2017 1814   LEUKOCYTESUR NEGATIVE 01/25/2017 1814   Sepsis Labs: @LABRCNTIP (procalcitonin:4,lacticidven:4)  ) Recent Results (from the past 240 hour(s))  Surgical pcr screen     Status: None   Collection Time: 02/08/17 10:41 PM  Result Value Ref Range Status   MRSA, PCR NEGATIVE NEGATIVE Final   Staphylococcus aureus NEGATIVE NEGATIVE Final    Comment: (NOTE) The Xpert SA Assay (FDA approved for NASAL specimens in patients 70 years of age and older), is one component of a comprehensive surveillance program. It is not intended to diagnose infection nor to guide or monitor treatment.       Radiology Studies: No results found.   Scheduled Meds: . cholecalciferol  1,000 Units Oral Daily  . docusate sodium  100 mg Oral BID  . doxazosin  4 mg Oral BID  . enoxaparin   Does not apply Once  . mycophenolate  360 mg Oral BID  . pantoprazole  40 mg Oral Daily  . pneumococcal 23 valent vaccine  0.5 mL Intramuscular Tomorrow-1000  . polyethylene glycol  17 g Oral Daily  . predniSONE  5 mg Oral Q breakfast  . senna-docusate  1 tablet Oral BID  . sodium chloride flush  3 mL Intravenous Q12H  . tacrolimus  1 mg Oral BID  . Warfarin - Pharmacist Dosing Inpatient   Does not apply q1800   Continuous Infusions: . sodium chloride 250 mL (02/13/17 0556)  . sodium chloride 75 mL/hr at 02/13/17 1515  . heparin 1,100 Units/hr (02/13/17 1335)     LOS: 7 days   Time Spent in minutes   30 minutes Author:  Berle Mull, MD [facility] Pager: [phone] 02/13/2017 6:34 PM     After 7pm go to [email or web address] - password [email or web address]  And look for the night coverage person covering for me after hours  [facility] Office  [phone]

## 2017-02-13 NOTE — Progress Notes (Signed)
Physical Therapy Treatment Patient Details Name: Jeffrey Perez MRN: [id number] DOB: 1945/04/13 Today's Date: 02/13/2017    History of Present Illness 71 yo male admitted for L facial droop, difficulty walking, dizziness.  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  PMHx:  lumbar scoliosis, atherosclerosis, osteoporosis, AKI, gout, poor sleep, kidney transplant patient, anemia, HTN, PAF, gout, cardiomegaly,     PT Comments    Pt is up to chair and did not feel like doing much but did work with him on exercise to increase active use of BLE's.  Pt is sitting with wife who shared he is in need of activity and is interested in him having therapy. Will progress as tolerated by pt to stand and start gait, but he did get up with nursing to assist him.   Follow Up Recommendations  Home health PT;Supervision for mobility/OOB     Equipment Recommendations  Rolling walker with 5" wheels    Recommendations for Other Services       Precautions / Restrictions Precautions Precautions: Fall(telemetry and foley) Restrictions Weight Bearing Restrictions: No    Mobility  Bed Mobility               General bed mobility comments: up when PT arrived  Transfers                 General transfer comment: declined  Ambulation/Gait                 Stairs            Wheelchair Mobility    Modified Rankin (Stroke Patients Only)       Balance                                            Cognition Arousal/Alertness: Awake/alert Behavior During Therapy: WFL for tasks assessed/performed Overall Cognitive Status: Within Functional Limits for tasks assessed                                 General Comments: wife present and speaks in Spanish to him      Exercises General Exercises - Lower Extremity Ankle Circles/Pumps: AROM;Both;20 reps;Strengthening Heel Slides: AAROM;Both;20 reps Hip  ABduction/ADduction: AROM;AAROM;Both;20 reps Hip Flexion/Marching: AROM;AAROM;Both;10 reps    General Comments        Pertinent Vitals/Pain Pain Assessment: No/denies pain    Home Living                      Prior Function            PT Goals (current goals can now be found in the care plan section) Progress towards PT goals: Progressing toward goals    Frequency    Min 2X/week      PT Plan Current plan remains appropriate    Co-evaluation              AM-PAC PT "6 Clicks" Daily Activity  Outcome Measure  Difficulty turning over in bed (including adjusting bedclothes, sheets and blankets)?: A Little Difficulty moving from lying on back to sitting on the side of the bed? : A Little Difficulty sitting down on and standing up from a chair with arms (e.g., wheelchair, bedside commode, etc,.)?: A Little Help needed moving to and  from a bed to chair (including a wheelchair)?: A Little Help needed walking in hospital room?: A Little Help needed climbing 3-5 steps with a railing? : A Lot 6 Click Score: 17    End of Session   Activity Tolerance: Patient limited by fatigue Patient left: in chair;with call bell/phone within reach;with chair alarm set;with family/visitor present Nurse Communication: Mobility status PT Visit Diagnosis: Muscle weakness (generalized) (M62.81);Unsteadiness on feet (R26.81);Difficulty in walking, not elsewhere classified (R26.2);Pain     Time: 0141-0301 PT Time Calculation (min) (ACUTE ONLY): 15 min  Charges:  $Therapeutic Exercise: 8-22 mins                    G Codes:  Functional Assessment Tool Used: AM-PAC 6 Clicks Basic Mobility     Ramond Dial 02/13/2017, 12:28 PM   Mee Hives, PT MS Acute Rehab Dept. Number: [place] [facility] [place]

## 2017-02-13 NOTE — Progress Notes (Signed)
ANTICOAGULATION CONSULT NOTE - Follow Up Consult  Pharmacy Consult for Heparin and warfarin Indication: atrial fibrillation  No Known Allergies  Patient Measurements: Height: 5\' 6"  (167.6 cm) Weight: 156 lb 3.2 oz (70.9 kg) IBW/kg (Calculated) : 63.8 Heparin Dosing Weight: 68 kg  Labs: Recent Labs    02/11/17 0427 02/11/17 1552 02/12/17 0348 02/12/17 1219  HGB 9.2*  --  9.3*  --   HCT 29.8*  --  29.6*  --   PLT 87*  --  89*  --   LABPROT 14.9  --  15.1  --   INR 1.18  --  1.20  --   HEPARINUNFRC  --  0.18* 0.39 0.51  CREATININE 1.40*  --  1.22  --    Assessment: 71 year old male seen in ED on 02/04/17 with facial drop- had been worsening over last several days to 1 week. Stroke ruled out. Imaging showed mass in frontal lobe and lesions in 4th ventricle. Heparin drip was resumed on 11/10.  Heparin level: 1.07; Morning labs were collected late; Patient as limited access for lab collections given history of HD. RN confirmed that the heparin infusion was paused and flushed prior to the heparin level this morning. RN was advised to pause the heparin infusion for one hour and resume at lower rate. CBC stable at this time and RN did not report any bleeding.   INR: 1.22  Warfarin PTA dose is 6mg  daily.   Goal of Therapy:  Heparin level 0.3-0.7 units/ml Monitor platelets by anticoagulation protocol: Yes   Plan:  HOLD heparin infusion for 1 hour Resume heparin infusion 1100 units/hr at 1330  Warfarin 6mg  PO x 1 dose  Daily heparin level, CBC, and INR  Monitor for signs and symptoms of bleeding   Georga Bora, PharmD Clinical Pharmacist 02/13/2017 8:04 AM

## 2017-02-13 NOTE — Care Management Important Message (Signed)
Important Message  Patient Details  Name: Jeffrey Perez MRN: [id number] Date of Birth: April 14, 1945   Medicare Important Message Given:  Yes    Calleigh Lafontant Montine Circle 02/13/2017, 2:52 PM

## 2017-02-13 NOTE — Progress Notes (Signed)
Pt."s  BP at 0430 SBP  below 100 , nurse went in to rechecked B P and noted   IV got disconnect. Some amount of bleeding noted . Pt asymptomatic of  SOB nor dizziness. Iv reconnected BP rechecked and is stable see flow sheet. . Pt verbalized  Symptoms of H/A and nausea have also improved with Prn Zofran phenergan and hydrocodone. No change in neuro status. Heparin drip still infusing at 12.61ml/hr. RN will continue to monitor.On call MD notified.

## 2017-02-13 NOTE — Op Note (Signed)
02/09/2017  12:35 PM  PATIENT:  Jeffrey Perez  71 y.o. male  PRE-OPERATIVE DIAGNOSIS:  Multifocal brain masses  POST-OPERATIVE DIAGNOSIS:  Same  PROCEDURE:  Stereotactic biopsy of right frontal lesion  SURGEON:  Aldean Ast, MD  ASSISTANTS: None  ANESTHESIA:   General  DRAINS: None   SPECIMEN:  Brain tumor  INDICATION FOR PROCEDURE: 71 year old male with multiple intracranial masses.  I recommended the above procedure.  The patient understood the risks, benefits, and alternatives and potential outcomes and wished to proceed.  PROCEDURE DETAILS: After smooth induction of general endotracheal anesthesia Mayfield pins were applied and the patient's head was fixated to the operating table.  He was positioned in the lounge chair position.  The Brainlab stereotactic system was registered to the patient.  An entry point at approximately Kocher's point was chosen and a point within the inferior frontal lesion was selected as the target.  The patient's hair was clipped around the entry site and wiped with alcohol.  Lidocaine and marcaine with epinephrine was infiltrated in the area of the planned incision.  The patient was prepped and draped in the usual sterile fashion.  The Brainlab stereotactic arm was placed in the appropriate position using the onscreen guide.  It was then moved out of the way by loosening one joint.  The skin was incised sharply down to the skull.  Pericranium was scraped back with periosteal elevators.  A self retaining retractor was placed.  A burr hole was drilled.  The dura was coagulated and sharply incised.  A pial incision was made.  Hemostasis was obtained.  The stereotactic arm was brought back into position.  The biopsy needle was gently inserted.  A sample was obtained and sent for frozen analysis.  Several more samples were obtained for permanent analysis.  The biopsy needle was removed.  Hemostasis was confirmed.  A piece of gelfoam was placed in the burr  hole.    I irrigated with bacitracin saline.  The galea was closed with interrupted vicryl sutures.  The skin was closed with a running vicryl suture.  A sterile dressing was applied.  After receiving confirmation from pathology that we had obtained lesional tissue the patient was removed from Mayfield pins and awoke from anesthesia without complication.  PATIENT DISPOSITION:  ICU - extubated and stable.   Delay start of Pharmacological VTE agent (>24hrs) due to surgical blood loss or risk of bleeding:  yes

## 2017-02-13 NOTE — Progress Notes (Signed)
ANTICOAGULATION CONSULT NOTE - Follow Up Consult  Pharmacy Consult for Heparin and warfarin Indication: atrial fibrillation  No Known Allergies  Patient Measurements: Height: 5\' 6"  (167.6 cm) Weight: 156 lb 3.2 oz (70.9 kg) IBW/kg (Calculated) : 63.8 Heparin Dosing Weight: 68 kg  Labs: Recent Labs    02/11/17 0427  02/12/17 0348 02/12/17 1219 02/13/17 0955 02/13/17 2136  HGB 9.2*  --  9.3*  --  10.2* 8.8*  HCT 29.8*  --  29.6*  --  32.7* 27.8*  PLT 87*  --  89*  --  106* 90*  LABPROT 14.9  --  15.1  --  15.3*  --   INR 1.18  --  1.20  --  1.22  --   HEPARINUNFRC  --    < > 0.39 0.51 1.07* 0.47  CREATININE 1.40*  --  1.22  --  1.79*  --    < > = values in this interval not displayed.   Assessment: 71 year old male seen in ED on 02/04/17 with facial drop- had been worsening over last several days to 1 week. Stroke ruled out. Imaging showed mass in frontal lobe and lesions in 4th ventricle. Heparin drip was resumed on 11/10.  Heparin level: 1.07; Morning labs were collected late; Patient as limited access for lab collections given history of HD. RN confirmed that the heparin infusion was paused and flushed prior to the heparin level this morning. RN was advised to pause the heparin infusion for one hour and resume at lower rate. CBC stable at this time and RN did not report any bleeding.   **Evening heparin level is therapeutic after rate reduction.  Warfarin PTA dose is 6mg  daily.   Goal of Therapy:  Heparin level 0.3-0.7 units/ml Monitor platelets by anticoagulation protocol: Yes   Plan:  -Continue heparin at 1100 units/hr -Daily HL, CBC  Hughes Better, PharmD, BCPS Clinical Pharmacist 02/13/2017 10:36 PM

## 2017-02-14 DIAGNOSIS — R112 Nausea with vomiting, unspecified: Secondary | ICD-10-CM

## 2017-02-14 DIAGNOSIS — C719 Malignant neoplasm of brain, unspecified: Secondary | ICD-10-CM

## 2017-02-14 LAB — HEPARIN LEVEL (UNFRACTIONATED)
HEPARIN UNFRACTIONATED: 0.34 [IU]/mL (ref 0.30–0.70)
HEPARIN UNFRACTIONATED: 0.25 [IU]/mL — AB (ref 0.30–0.70)
Heparin Unfractionated: 0.33 IU/mL (ref 0.30–0.70)

## 2017-02-14 LAB — RENAL FUNCTION PANEL
Albumin: 2.8 g/dL — ABNORMAL LOW (ref 3.5–5.0)
Anion gap: 6 (ref 5–15)
BUN: 26 mg/dL — ABNORMAL HIGH (ref 6–20)
CHLORIDE: 110 mmol/L (ref 101–111)
CO2: 21 mmol/L — AB (ref 22–32)
CREATININE: 1.72 mg/dL — AB (ref 0.61–1.24)
Calcium: 8.3 mg/dL — ABNORMAL LOW (ref 8.9–10.3)
GFR calc non Af Amer: 38 mL/min — ABNORMAL LOW (ref >60)
GFR, EST AFRICAN AMERICAN: 44 mL/min — AB (ref >60)
GLUCOSE: 95 mg/dL (ref 65–99)
Phosphorus: 2.4 mg/dL — ABNORMAL LOW (ref 2.5–4.6)
Potassium: 3.9 mmol/L (ref 3.5–5.1)
Sodium: 137 mmol/L (ref 135–145)

## 2017-02-14 LAB — CBC
HCT: 29.3 % — ABNORMAL LOW (ref 39.0–52.0)
HEMOGLOBIN: 9.3 g/dL — AB (ref 13.0–17.0)
MCH: 28.9 pg (ref 26.0–34.0)
MCHC: 31.7 g/dL (ref 30.0–36.0)
MCV: 91 fL (ref 78.0–100.0)
Platelets: 85 10*3/uL — ABNORMAL LOW (ref 150–400)
RBC: 3.22 MIL/uL — ABNORMAL LOW (ref 4.22–5.81)
RDW: 14.4 % (ref 11.5–15.5)
WBC: 3 10*3/uL — ABNORMAL LOW (ref 4.0–10.5)

## 2017-02-14 LAB — PROTIME-INR
INR: 1.32
Prothrombin Time: 16.3 seconds — ABNORMAL HIGH (ref 11.4–15.2)

## 2017-02-14 MED ORDER — WARFARIN SODIUM 7.5 MG PO TABS
7.5000 mg | ORAL_TABLET | Freq: Once | ORAL | Status: AC
  Administered 2017-02-14: 7.5 mg via ORAL
  Filled 2017-02-14: qty 1

## 2017-02-14 MED ORDER — WHITE PETROLATUM EX OINT
TOPICAL_OINTMENT | CUTANEOUS | Status: AC
  Administered 2017-02-14
  Filled 2017-02-14: qty 28.35

## 2017-02-14 NOTE — Progress Notes (Signed)
[facility] Follow-up Note  Patient Care Team: System, Pcp Not In as PCP - General  CHIEF COMPLAINTS/PURPOSE OF CONSULTATION:  Glioblastoma  INTERVAL HISTORY:  Jeffrey Perez complain of several episodes of nausea and vomiting today.  He is waiting on his wife to arrive.  He is still unable to look to the left with his eyes or close his left eyelid.   MEDICAL HISTORY:  Past Medical History:  Diagnosis Date  . Acute kidney injury ([place]) 01/25/2017   Archie Endo 01/25/2017  . Anemia   . Arthritis    'all over my bones" (01/26/2017)  . Coronary artery disease   . ESRD (end stage renal disease) on dialysis [facility]) 1992-1994   "stopped when I got the transplant"  . Gout   . History of blood transfusion    "very many times" (01/26/2017)  . Hypertension     ALLERGIES:  has No Known Allergies.  MEDICATIONS:  Current Facility-Administered Medications  Medication Dose Route Frequency Provider Last Rate Last Dose  . 0.9 %  sodium chloride infusion  250 mL Intravenous PRN Cristal Ford, DO 10 mL/hr at 02/13/17 0556 250 mL at 02/13/17 0556  . 0.9 %  sodium chloride infusion   Intravenous Continuous Lavina Hamman, MD 75 mL/hr at 02/13/17 2135    . acetaminophen (TYLENOL) tablet 650 mg  650 mg Oral Q6H PRN Cristal Ford, DO   650 mg at 02/12/17 1048   Or  . acetaminophen (TYLENOL) suppository 650 mg  650 mg Rectal Q6H PRN Cristal Ford, DO      . bisacodyl (DULCOLAX) EC tablet 5 mg  5 mg Oral Daily PRN Ditty, Kevan Ny, MD      . cholecalciferol (VITAMIN D) tablet 1,000 Units  1,000 Units Oral Daily Cristal Ford, DO   1,000 Units at 02/14/17 0919  . docusate sodium (COLACE) capsule 100 mg  100 mg Oral BID Ditty, Kevan Ny, MD   100 mg at 02/14/17 0924  . doxazosin (CARDURA) tablet 4 mg  4 mg Oral BID Lavina Hamman, MD   4 mg at 02/14/17 9528  . enoxaparin (LOVENOX) patient education kit   Does not apply Once Lavina Hamman, MD      .  heparin ADULT infusion 100 units/mL (25000 units/243m sodium chloride 0.45%)  1,200 Units/hr Intravenous Continuous RDawayne Cirri RPH 12 mL/hr at 02/14/17 1423 1,200 Units/hr at 02/14/17 1423  . hydrALAZINE (APRESOLINE) injection 5-10 mg  5-10 mg Intravenous Q1H PRN Ditty, BKevan Ny MD   5 mg at 02/12/17 2153  . HYDROcodone-acetaminophen (NORCO/VICODIN) 5-325 MG per tablet 1 tablet  1 tablet Oral Q4H PRN Ditty, BKevan Ny MD   1 tablet at 02/13/17 0202  . mycophenolate (MYFORTIC) EC tablet 360 mg  360 mg Oral BID MCristal Ford DO   360 mg at 02/14/17 04132 . naphazoline-glycerin (CLEAR EYES) ophth solution 1-2 drop  1-2 drop Left Eye QID PRN MCristal Ford DO   1 drop at 02/12/17 2228  . ondansetron (ZOFRAN) tablet 4 mg  4 mg Oral Q4H PRN Ditty, BKevan Ny MD   4 mg at 02/11/17 2140   Or  . ondansetron (ZOFRAN) injection 4 mg  4 mg Intravenous Q4H PRN Ditty, BKevan Ny MD   4 mg at 02/14/17 1535  . pantoprazole (PROTONIX) EC tablet 40 mg  40 mg Oral Daily PLavina Hamman MD   40 mg at 02/14/17 0919  . pneumococcal 23 valent vaccine (PNU-IMMUNE) injection 0.5  mL  0.5 mL Intramuscular Tomorrow-1000 Cristal Ford, DO   Stopped at 02/07/17 0031  . polyethylene glycol (MIRALAX / GLYCOLAX) packet 17 g  17 g Oral Daily Lavina Hamman, MD   17 g at 02/14/17 0917  . predniSONE (DELTASONE) tablet 5 mg  5 mg Oral Q breakfast Cristal Ford, DO   5 mg at 02/14/17 4656  . promethazine (PHENERGAN) tablet 12.5-25 mg  12.5-25 mg Oral Q4H PRN Ditty, Kevan Ny, MD   25 mg at 02/12/17 2148  . senna-docusate (Senokot-S) tablet 1 tablet  1 tablet Oral BID Lavina Hamman, MD   1 tablet at 02/14/17 0919  . sodium chloride flush (NS) 0.9 % injection 3 mL  3 mL Intravenous Q12H Mikhail, [place], DO   3 mL at 02/12/17 2230  . sodium chloride flush (NS) 0.9 % injection 3 mL  3 mL Intravenous PRN Cristal Ford, DO      . sodium phosphate (FLEET) 7-19 GM/118ML enema 1 enema  1  enema Rectal Once PRN Ditty, Kevan Ny, MD      . tacrolimus (PROGRAF) capsule 1 mg  1 mg Oral BID Cristal Ford, DO   1 mg at 02/14/17 0919  . warfarin (COUMADIN) tablet 7.5 mg  7.5 mg Oral ONCE-1800 Rudisill, Jodean Lima, RPH      . Warfarin - Pharmacist Dosing Inpatient   Does not apply q1800 Jalene Mullet, [facility]        REVIEW OF SYSTEMS:   Constitutional: Denies fevers, chills or abnormal weight loss Eyes: Denies blurriness of vision Ears, nose, mouth, throat, and face: Denies mucositis or sore throat Respiratory: Denies cough, dyspnea or wheezes Cardiovascular: Denies palpitation, chest discomfort or lower extremity swelling Gastrointestinal:  Nausea, vomiting GU: Denies dysuria or incontinence Skin: Denies abnormal skin rashes Neurological: Per HPI Musculoskeletal: Denies joint pain, back or neck discomfort. No decrease in ROM Behavioral/Psych: Denies anxiety, disturbance in thought content, and mood instability   PHYSICAL EXAMINATION: Vitals:   02/14/17 0944 02/14/17 1400  BP: 133/80 (!) 145/69  Pulse: 79 (!) 55  Resp: 20   Temp: 98.2 F (36.8 C) 98.4 F (36.9 C)  SpO2: 95% 95%   KPS: 60. General: Alert, cooperative, pleasant,  Head:Normal EENT: No conjunctival injection or scleral icterus. Oral mucosa moist Lungs: Resp effort normal Cardiac: Regular rate and rhythm Abdomen: Soft, non-distended abdomen Skin: No rashes cyanosis or petechiae. Extremities: Gout noted on extremities esp elbow.  Residual soft tissue distortion from prior dialysis.  NEUROLOGIC EXAM: Mental Status: Awake, alert, attentive to examiner. Oriented to self and environment. Language is fluent with intact comprehension.  Limited insight, elements of apathy, poor insight into gravity of disease.  Cranial Nerves: Visual acuity is grossly normal. Visual fields are full. Noted dense left gaze paresis with course end-gaze nystagmus on right gaze. Left lower motor neuron facial paresis.  Otherwise  pupils reactive, palate symmetric.  Motor: Tone and bulk are normal. Power is full in both arms and legs. Reflexes are symmetric, no pathologic reflexes present. Intact finger to nose bilaterally Sensory: Intact to light touch and temperature Gait: Deferred  LABORATORY DATA:  I have reviewed the data as listed Lab Results  Component Value Date   WBC 3.0 (L) 02/14/2017   HGB 9.3 (L) 02/14/2017   HCT 29.3 (L) 02/14/2017   MCV 91.0 02/14/2017   PLT 85 (L) 02/14/2017   Recent Labs    01/27/17 0221  01/28/17 8127 02/04/17 1507  02/12/17 0348 02/13/17 0955 02/14/17 0259  NA 139   < > 139 140   < > 138 135 137  K 4.1   < > 3.8 3.8   < > 3.7 4.7 3.9  CL 108   < > 108 109   < > 109 107 110  CO2 22   < > 21* 23   < > 23 21* 21*  GLUCOSE 97   < > 85 124*   < > 103* 154* 95  BUN 44*   < > 42* 41*   < > 18 22* 26*  CREATININE 1.96*   < > 1.75* 1.70*   < > 1.22 1.79* 1.72*  CALCIUM 9.9   < > 9.4 9.7   < > 8.5* 8.5* 8.3*  GFRNONAA 32*   < > 37* 38*   < > 57* 36* 38*  GFRAA 37*   < > 43* 44*   < > >60 42* 44*  PROT 5.6*  --  5.3* 6.4*  --   --   --   --   ALBUMIN 3.2*  --  3.0* 3.7  --   --   --  2.8*  AST 18  --  18 21  --   --   --   --   ALT 15*  --  15* 17  --   --   --   --   ALKPHOS 49  --  45 58  --   --   --   --   BILITOT 0.7  --  0.5 0.7  --   --   --   --    < > = values in this interval not displayed.     ASSESSMENT & PLAN:   Glioblastoma  Pathology has now confirmed diagnosis of glioblastoma.  The case was reviewed in brain tumor board this week.    The group reached consensus regarding overwhelming likelihood of leptomeningeal spread, given clinical and radiographic findings.  The pathology and leptomeningeal dissemination, combined with history of renal transplant, accompanying immunosuppression and lack of candidacy for most systemic therapies, makes his prognosis extremely poor.   At this time my recommendation to Mr. Sortino was to pursue comfort care measures  only.  He will discuss this consideration with his wife when she arrives.  Palliative care will likely be able to see him tomorrow morning if he is still inpatient.    If he does elect to pursue treatment it would consist of radiation only, the administration of which would be complex and risky given the presence of multifocal disease and the likelihood of CNS dissemination.   I am available to discuss further via page if necessary [phone]).  The total time spent in the encounter was 35 minutes and more than 50% was on counseling and review of test results     Ventura Sellers, MD 02/14/2017 4:08 PM

## 2017-02-14 NOTE — Progress Notes (Signed)
Physical Therapy Treatment Patient Details Name: Jeffrey Perez MRN: [id number] DOB: [birth year] Today's Date: 02/14/2017    History of Present Illness 71 yo male admitted for L facial droop, difficulty walking, dizziness. 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  PMHx:  lumbar scoliosis, atherosclerosis, osteoporosis, AKI, gout, poor sleep, kidney transplant patient, anemia, HTN, PAF, gout, cardiomegaly,     PT Comments    Pt continues to demonstrate generalized muscular weakness, endurance deficits, impaired mobility/balance, and impaired cognition secondary to above. Pt progressed towards PT goals today, ambulating for the first time in several days. Pt c/o dizziness upon standing and during ambulation, which subsides when seated. However pt continues to ambulate despite dizziness and signs of instability and required cueing to maintain safety during ambulation. Pt vomits at end of session once back in bed. HOB left elevated and nurse notified. Pt will benefit from continued skilled PT in order to improve upon above mentoined deficits and increase level of independence.    Follow Up Recommendations  Home health PT;Supervision for mobility/OOB     Equipment Recommendations  Rolling walker with 5" wheels    Recommendations for Other Services       Precautions / Restrictions Precautions Precautions: Fall Restrictions Weight Bearing Restrictions: No    Mobility  Bed Mobility Overal bed mobility: Needs Assistance Bed Mobility: Supine to Sit;Sit to Supine     Supine to sit: Supervision;HOB elevated Sit to supine: Supervision;HOB elevated   General bed mobility comments: increased time required  Transfers Overall transfer level: Needs assistance Equipment used: Rolling walker (2 wheeled) Transfers: Sit to/from Stand Sit to Stand: Min guard         General transfer comment: sit-to-stand x3, pt c/o dizzines upon standing which  decreases after a few minutes of sitting back down  Ambulation/Gait Ambulation/Gait assistance: Min guard;Min assist Ambulation Distance (Feet): 20 Feet(+ 30' + 30' + 20') Assistive device: Rolling walker (2 wheeled) Gait Pattern/deviations: Trunk flexed;Drifts right/left;Decreased stride length;Step-through pattern Gait velocity: decreased   General Gait Details: 1 standing + 2 seated rest breaks. min assist when pt demonstrates LOB, otherwise min guard. pt drifts towards walls (most likely for tactile support), pt c/o dizziness with ambulation. pt requires VCs to look straight ahead at objects during ambulation and to stand upright.   Stairs            Wheelchair Mobility    Modified Rankin (Stroke Patients Only)       Balance Overall balance assessment: Needs assistance Sitting-balance support: Feet supported;No upper extremity supported Sitting balance-Leahy Scale: Fair Sitting balance - Comments: Pt demonstrates sitting balance at EOB, able to use UEs freely with feet supported.    Standing balance support: Bilateral upper extremity supported;During functional activity Standing balance-Leahy Scale: Poor Standing balance comment: Pt relies on support from RW for standing balance. Pt c/o dizziness and demonstrates slight swaying.                             Cognition Arousal/Alertness: Awake/alert Behavior During Therapy: WFL for tasks assessed/performed;Impulsive Overall Cognitive Status: Impaired/Different from baseline Area of Impairment: Safety/judgement;Memory;Problem solving;Attention                   Current Attention Level: Focused Memory: Decreased short-term memory   Safety/Judgement: Decreased awareness of safety;Decreased awareness of deficits   Problem Solving: Requires verbal cues;Difficulty sequencing General Comments: Pt continues with ambulation despite c/o dizziness  and being visibly unsteady. pt requiring constant cueing  during gait to look up and concentrate on stationary object. Pt states that he is in hospital because of stroke, however he was made aware of diagnosis of glioblastoma yesterday.      Exercises      General Comments General comments (skin integrity, edema, etc.): Pt vomits at end of session - nurse informed. Pt motivated to go home to [place].       Pertinent Vitals/Pain Pain Assessment: No/denies pain    Home Living                      Prior Function            PT Goals (current goals can now be found in the care plan section) Progress towards PT goals: Progressing toward goals    Frequency    Min 2X/week      PT Plan Current plan remains appropriate    Co-evaluation              AM-PAC PT "6 Clicks" Daily Activity  Outcome Measure  Difficulty turning over in bed (including adjusting bedclothes, sheets and blankets)?: A Little Difficulty moving from lying on back to sitting on the side of the bed? : A Little Difficulty sitting down on and standing up from a chair with arms (e.g., wheelchair, bedside commode, etc,.)?: A Little Help needed moving to and from a bed to chair (including a wheelchair)?: A Little Help needed walking in hospital room?: A Little Help needed climbing 3-5 steps with a railing? : A Lot 6 Click Score: 17    End of Session Equipment Utilized During Treatment: Gait belt Activity Tolerance: Patient limited by fatigue(limited by dizziness) Patient left: in bed;with bed alarm set;with call bell/phone within reach Nurse Communication: Mobility status PT Visit Diagnosis: Muscle weakness (generalized) (M62.81);Unsteadiness on feet (R26.81);Difficulty in walking, not elsewhere classified (R26.2);Pain     Time: 4825-0037 PT Time Calculation (min) (ACUTE ONLY): 21 min  Charges:  $Gait Training: 8-22 mins                    G Codes:       Jeffrey Perez, SPT   Jeffrey Perez 02/14/2017, 4:04 PM

## 2017-02-14 NOTE — Progress Notes (Signed)
Please do NOT call daughter-Patient has not told daughter that he is at the hospital.

## 2017-02-14 NOTE — Progress Notes (Addendum)
Please do NOT call daughter-Patient has not told daughter that he is at the hospital  Patient gave RN name and number of his daughter. He states "she has Power of [place] and Living Will" Cicilia Corener([phone])H; cell phone [phone])

## 2017-02-14 NOTE — Progress Notes (Addendum)
PROGRESS NOTE    Jeffrey Perez  KKX:[id number] DOB: [birth year] DOA: 02/04/2017 PCP: System, Pcp Not In   Chief Complaint  Patient presents with  . Facial Droop    Brief Narrative:  HPI on 02/04/2017 Jeffrey Perez is a 71 y.o. male with a medical history of renal transplant/end-stage renal disease, gout, hypertension, anemia, who presented to the emergency department with complaints of left facial droop and concern for stroke. Patient was recently admitted and discharged on 01/28/2017 for generalized weakness. Patient reports that since leaving the hospital last week, he started having some left-sided weakness of his face. Family friend present at bedside, reported that she noticed it was worse today. Patient also reported some difficulty walking and dizziness. Denies any recent illness, travel. Denies chest pain, shortness of breath, abdominal pain, nausea vomiting, diarrhea or constipation, changes in urinary or bowel habits.   Assessment & Plan   Left facial droop/Brain lesion- Glioblastoma -CT head: Abnormal appearance of both anterior inferior frontal lobes and anterior corpus callosum, resembling a tumor which has crossed midline rather than infarct.  -MRI with and without contrast: Heterogeneously enhancing mass lesion in the anterior inferior frontal lobes crosses midline, measures 5.3 x 3.6 x 4.0 cm -Neurology consulted and appreciated -Neurosurgery consulted and appreciated, s/p stereotactic brain biopsy. Biopsy results show glioblastoma, high-grade -Interventional radiology consulted and appreciated, however no longer needed lumbar puncture -Continue eye drops as he complains that his left eye is burning (as he cannot blink) -Neuro-oncology consulted and appreciated, pending recommendations  History of renal transplant/CKD -Transplant occurred 25 years ago, no longer on dialysis -Unknown baseline creatinine, currently 1.79 -Continue monitor BMP -Continue mycophenolate,  prednisone, tacrolimus  Paroxysmal atrial fibrillation -Currently in sinus rhythm -CHADSVASC 2 (based on age and HTN) -INR now 1.32 -Continue heparin and coumadin per pharmacy -may consider discharging patient home with lovenox bridge, however, given that patient is in sinus rhythm, may not need bridging   Chronic mesenteric anemia/thrombocytopenia -Hemoglobin stable, currently 9.3 -platelet currently stable, 85 -Continue to monitor CBC  Essential hypertension -Continue Cardura, dose reduced to 4mg  BID  Nausea and vomiting/Constipation -Continue stool softener  -appears to have improved  Deconditioning -Likely from the above, PT and OT recommend home health, rolling walker  DVT Prophylaxis  Coumadin/heparin  Code Status: Full  Family Communication: None at bedside  Disposition Plan: Admitted. Pending biopsy later this week  Consultants Neurology Neurosurgery Interventional radiology Neuro-oncology  Procedures  Stereotatic brain biopsy  Antibiotics   Anti-infectives (From admission, onward)   Start     Dose/Rate Route Frequency Ordered Stop   02/09/17 2030  ceFAZolin (ANCEF) IVPB 2g/100 mL premix     2 g 200 mL/hr over 30 Minutes Intravenous Every 8 hours 02/09/17 1457 02/10/17 0505   02/09/17 0800  ceFAZolin (ANCEF) IVPB 2g/100 mL premix     2 g 200 mL/hr over 30 Minutes Intravenous To ShortStay Surgical 02/08/17 2226 02/09/17 1205   02/09/17 0600  ceFAZolin (ANCEF) IVPB 2g/100 mL premix  Status:  Discontinued     2 g 200 mL/hr over 30 Minutes Intravenous On call to O.R. 02/08/17 2231 02/09/17 0738      Subjective:   Neldon Newport Credeur seen and examined today.  Patient has no complaints today. Continues to have facial droop. Denies chest pain, shortness breath, abdominal pain. Feels nausea and vomiting have improved.  Objective:   Vitals:   02/13/17 2036 02/14/17 0101 02/14/17 0512 02/14/17 0944  BP: (!) 110/58 101/63 (!) 131/59 133/80  Pulse: (!) 55 Marland Kitchen)  59 87 79  Resp: 20 20 18 20   Temp: 98.4 F (36.9 C) 98.6 F (37 C) 97.8 F (36.6 C) 98.2 F (36.8 C)  TempSrc: Oral Oral Oral Oral  SpO2: 92% 93% 96% 95%  Weight:      Height:        Intake/Output Summary (Last 24 hours) at 02/14/2017 1354 Last data filed at 02/14/2017 7628 Gross per 24 hour  Intake 1286.83 ml  Output 1975 ml  Net -688.17 ml   Filed Weights   02/06/17 1344 02/09/17 1500 02/11/17 0623  Weight: 66 kg (145 lb 8.1 oz) 68.2 kg (150 lb 5.7 oz) 70.9 kg (156 lb 3.2 oz)   Exam  General: Well developed, well nourished, NAD, appears stated age  HEENT: NCAT, mucous membranes moist. Left facial droop  Cardiovascular: S1 S2 auscultated, +SEM, RRR  Respiratory: Clear to auscultation bilaterally with equal chest rise  Abdomen: Soft, nontender, nondistended, + bowel sounds  Extremities: warm dry without cyanosis clubbing or edema  Neuro: AAOx3, left hemianopsia with facial weakness  Psych: Appropriate mood and affect  Data Reviewed: I have personally reviewed following labs and imaging studies  CBC: Recent Labs  Lab 02/11/17 0427 02/12/17 0348 02/13/17 0955 02/13/17 2136 02/14/17 0258  WBC 3.3* 3.2* 4.2 3.5* 3.0*  HGB 9.2* 9.3* 10.2* 8.8* 9.3*  HCT 29.8* 29.6* 32.7* 27.8* 29.3*  MCV 90.9 90.2 90.8 89.7 91.0  PLT 87* 89* 106* 90* 85*   Basic Metabolic Panel: Recent Labs  Lab 02/08/17 2342 02/10/17 0438 02/11/17 0427 02/12/17 0348 02/13/17 0955 02/14/17 0259  NA 138 137 140 138 135 137  K 3.9 4.2 3.9 3.7 4.7 3.9  CL 110 110 113* 109 107 110  CO2 23 20* 22 23 21* 21*  GLUCOSE 100* 118* 85 103* 154* 95  BUN 37* 28* 25* 18 22* 26*  CREATININE 1.69* 1.52* 1.40* 1.22 1.79* 1.72*  CALCIUM 8.8* 8.3* 8.5* 8.5* 8.5* 8.3*  MG 1.9 1.7  --   --   --   --   PHOS  --   --   --   --   --  2.4*   GFR: Estimated Creatinine Clearance: 34.5 mL/min (A) (by C-G formula based on SCr of 1.72 mg/dL (H)). Liver Function Tests: Recent Labs  Lab 02/14/17 0259    ALBUMIN 2.8*   No results for input(s): LIPASE, AMYLASE in the last 168 hours. No results for input(s): AMMONIA in the last 168 hours. Coagulation Profile: Recent Labs  Lab 02/10/17 0438 02/11/17 0427 02/12/17 0348 02/13/17 0955 02/14/17 0258  INR 1.18 1.18 1.20 1.22 1.32   Cardiac Enzymes: No results for input(s): CKTOTAL, CKMB, CKMBINDEX, TROPONINI in the last 168 hours. BNP (last 3 results) No results for input(s): PROBNP in the last 8760 hours. HbA1C: No results for input(s): HGBA1C in the last 72 hours. CBG: Recent Labs  Lab 02/09/17 1602 02/09/17 1939  GLUCAP 118* 222*   Lipid Profile: No results for input(s): CHOL, HDL, LDLCALC, TRIG, CHOLHDL, LDLDIRECT in the last 72 hours. Thyroid Function Tests: No results for input(s): TSH, T4TOTAL, FREET4, T3FREE, THYROIDAB in the last 72 hours. Anemia Panel: No results for input(s): VITAMINB12, FOLATE, FERRITIN, TIBC, IRON, RETICCTPCT in the last 72 hours. Urine analysis:    Component Value Date/Time   COLORURINE YELLOW 01/25/2017 1814   APPEARANCEUR CLEAR 01/25/2017 1814   LABSPEC 1.008 01/25/2017 1814   PHURINE 6.0 01/25/2017 1814   GLUCOSEU NEGATIVE 01/25/2017 1814   HGBUR NEGATIVE 01/25/2017 1814  BILIRUBINUR NEGATIVE 01/25/2017 1814   [place] 01/25/2017 1814   PROTEINUR NEGATIVE 01/25/2017 1814   UROBILINOGEN 0.2 05/10/2013 1015   NITRITE NEGATIVE 01/25/2017 1814   LEUKOCYTESUR NEGATIVE 01/25/2017 1814   Sepsis Labs: @LABRCNTIP (procalcitonin:4,lacticidven:4)  ) Recent Results (from the past 240 hour(s))  Surgical pcr screen     Status: None   Collection Time: 02/08/17 10:41 PM  Result Value Ref Range Status   MRSA, PCR NEGATIVE NEGATIVE Final   Staphylococcus aureus NEGATIVE NEGATIVE Final    Comment: (NOTE) The Xpert SA Assay (FDA approved for NASAL specimens in patients 25 years of age and older), is one component of a comprehensive surveillance program. It is not intended to diagnose  infection nor to guide or monitor treatment.       Radiology Studies: Dg Cervical Spine 2 Or 3 Views  Result Date: 02/13/2017 CLINICAL DATA:  Pt c/o neck pain that radiates into both of his shoulders x 1 day; no injury to the area. Best obtainable images due to pt condition. No hx of prior injuries or surgeries to the area. EXAM: CERVICAL SPINE - 2-3 VIEW COMPARISON:  None. FINDINGS: There is depression of the upper endplate of C4, which appears chronic. There is no associated soft tissue swelling. All remaining vertebra are normal in height. There is a grade 1 anterolisthesis of C7 on T1. This appears degenerative in origin. There is moderate-to-marked loss of disc height from C3-C4 through C6-C7. Endplate spurring is noted at all of these levels. The bones are diffusely demineralized. Soft tissues are unremarkable. IMPRESSION: 1. No acute fractures or acute finding. 2. Significant degenerative changes noted throughout the cervical spine. Chronic appearing loss of vertebral body height at C4. Electronically Signed   By: Lajean Manes M.D.   On: 02/13/2017 20:05     Scheduled Meds: . cholecalciferol  1,000 Units Oral Daily  . docusate sodium  100 mg Oral BID  . doxazosin  4 mg Oral BID  . enoxaparin   Does not apply Once  . mycophenolate  360 mg Oral BID  . pantoprazole  40 mg Oral Daily  . pneumococcal 23 valent vaccine  0.5 mL Intramuscular Tomorrow-1000  . polyethylene glycol  17 g Oral Daily  . predniSONE  5 mg Oral Q breakfast  . senna-docusate  1 tablet Oral BID  . sodium chloride flush  3 mL Intravenous Q12H  . tacrolimus  1 mg Oral BID  . warfarin  7.5 mg Oral ONCE-1800  . Warfarin - Pharmacist Dosing Inpatient   Does not apply q1800   Continuous Infusions: . sodium chloride 250 mL (02/13/17 0556)  . sodium chloride 75 mL/hr at 02/13/17 2135  . heparin 1,100 Units/hr (02/13/17 2312)     LOS: 8 days   Time Spent in minutes   30 minutes  Shawnte Demarest D.O. on  02/14/2017 at 1:54 PM  Between 7am to 7pm - Pager - [phone]  After 7pm go to [email or web address] - password [email or web address]  And look for the night coverage person covering for me after hours  [facility] Office  [phone]

## 2017-02-14 NOTE — Progress Notes (Addendum)
ANTICOAGULATION CONSULT NOTE - Follow Up Consult  Pharmacy Consult for Heparin and warfarin Indication: atrial fibrillation  No Known Allergies  Patient Measurements: Height: 5\' 6"  (167.6 cm) Weight: 156 lb 3.2 oz (70.9 kg) IBW/kg (Calculated) : 63.8 Heparin Dosing Weight: 68 kg  Labs: Recent Labs    02/12/17 0348  02/13/17 0955 02/13/17 2136 02/14/17 0258 02/14/17 0259  HGB 9.3*  --  10.2* 8.8* 9.3*  --   HCT 29.6*  --  32.7* 27.8* 29.3*  --   PLT 89*  --  106* 90* 85*  --   LABPROT 15.1  --  15.3*  --  16.3*  --   INR 1.20  --  1.22  --  1.32  --   HEPARINUNFRC 0.39   < > 1.07* 0.47 0.34  --   CREATININE 1.22  --  1.79*  --   --  1.72*   < > = values in this interval not displayed.   Assessment: 71 year old male seen in ED on 02/04/17 with facial drop- had been worsening over last several days to 1 week. Stroke ruled out. Imaging showed mass in frontal lobe and lesions in 4th ventricle. Heparin drip was resumed on 11/10.  Heparin level therapeutic: 0.34 - Biopsy of right frontal lesion performed 11/12, CBC remains low but stable (Hgb 9.3, PLT 85); NO bleeding reported. Dr. Hewitt Shorts note from yesterday suggested holding anticoagulation post procedure, but but heparin and warfarin were continued. Spoke to primary team provider and at this time she wishes to continue the heparin infusion. The plan is to follow-up with neuro-onc regarding the anticoagulation plan going forward.  INR: 1.32 following 2nd dose of warfarin  Warfarin PTA dose is 6mg  daily.   Goal of Therapy:  Heparin level 0.3-0.7 units/ml Monitor platelets by anticoagulation protocol: Yes   Plan:  Continue heparin infusion 1100 units/hr  Warfarin 7.5mg  PO x 1 dose  Daily heparin level, CBC, and INR  Monitor for signs and symptoms of bleeding   Georga Bora, PharmD Clinical Pharmacist 02/14/2017 8:04 AM

## 2017-02-14 NOTE — Progress Notes (Signed)
ANTICOAGULATION CONSULT NOTE - Follow Up Consult  Pharmacy Consult for Heparin and warfarin Indication: atrial fibrillation  No Known Allergies  Patient Measurements: Height: 5\' 6"  (167.6 cm) Weight: 156 lb 3.2 oz (70.9 kg) IBW/kg (Calculated) : 63.8 Heparin Dosing Weight: 68 kg  Labs: Recent Labs    02/12/17 0348  02/13/17 0955 02/13/17 2136 02/14/17 0258 02/14/17 0259 02/14/17 1217  HGB 9.3*  --  10.2* 8.8* 9.3*  --   --   HCT 29.6*  --  32.7* 27.8* 29.3*  --   --   PLT 89*  --  106* 90* 85*  --   --   LABPROT 15.1  --  15.3*  --  16.3*  --   --   INR 1.20  --  1.22  --  1.32  --   --   HEPARINUNFRC 0.39   < > 1.07* 0.47 0.34  --  0.25*  CREATININE 1.22  --  1.79*  --   --  1.72*  --    < > = values in this interval not displayed.   Assessment: 71 year old male seen in ED on 02/04/17 with facial drop- had been worsening over last several days to 1 week. Stroke ruled out. Imaging showed mass in frontal lobe and lesions in 4th ventricle. Heparin drip was resumed on 11/10.  Heparin level subtherapeutic: 0.25, no infusion issues and no bleeding reported from RN INR: 1.32 following 2nd dose of warfarin  Warfarin PTA dose is 6mg  daily.   Goal of Therapy:  Heparin level 0.3-0.7 units/ml Monitor platelets by anticoagulation protocol: Yes   Plan:  Increase heparin gtt to 1200 units/hr  Warfarin 7.5mg  PO x 1 dose  Daily heparin level, CBC, and INR  Monitor for signs and symptoms of bleeding   Georga Bora, PharmD Clinical Pharmacist 02/14/2017 1:56 PM

## 2017-02-15 ENCOUNTER — Encounter (HOSPITAL_COMMUNITY): Payer: Self-pay | Admitting: Neurological Surgery

## 2017-02-15 DIAGNOSIS — Z515 Encounter for palliative care: Secondary | ICD-10-CM

## 2017-02-15 DIAGNOSIS — Z66 Do not resuscitate: Secondary | ICD-10-CM

## 2017-02-15 DIAGNOSIS — C719 Malignant neoplasm of brain, unspecified: Secondary | ICD-10-CM

## 2017-02-15 LAB — CBC
HEMATOCRIT: 31.9 % — AB (ref 39.0–52.0)
HEMOGLOBIN: 10 g/dL — AB (ref 13.0–17.0)
MCH: 28.7 pg (ref 26.0–34.0)
MCHC: 31.3 g/dL (ref 30.0–36.0)
MCV: 91.7 fL (ref 78.0–100.0)
Platelets: 90 10*3/uL — ABNORMAL LOW (ref 150–400)
RBC: 3.48 MIL/uL — AB (ref 4.22–5.81)
RDW: 14.8 % (ref 11.5–15.5)
WBC: 2.7 10*3/uL — ABNORMAL LOW (ref 4.0–10.5)

## 2017-02-15 LAB — PROTIME-INR
INR: 1.46
PROTHROMBIN TIME: 17.6 s — AB (ref 11.4–15.2)

## 2017-02-15 LAB — RENAL FUNCTION PANEL
ANION GAP: 7 (ref 5–15)
Albumin: 3 g/dL — ABNORMAL LOW (ref 3.5–5.0)
BUN: 20 mg/dL (ref 6–20)
CALCIUM: 8.7 mg/dL — AB (ref 8.9–10.3)
CO2: 22 mmol/L (ref 22–32)
Chloride: 108 mmol/L (ref 101–111)
Creatinine, Ser: 1.33 mg/dL — ABNORMAL HIGH (ref 0.61–1.24)
GFR calc non Af Amer: 51 mL/min — ABNORMAL LOW (ref >60)
GFR, EST AFRICAN AMERICAN: 60 mL/min — AB (ref >60)
Glucose, Bld: 92 mg/dL (ref 65–99)
PHOSPHORUS: 2.7 mg/dL (ref 2.5–4.6)
POTASSIUM: 3.8 mmol/L (ref 3.5–5.1)
SODIUM: 137 mmol/L (ref 135–145)

## 2017-02-15 LAB — HEPARIN LEVEL (UNFRACTIONATED): Heparin Unfractionated: 0.47 IU/mL (ref 0.30–0.70)

## 2017-02-15 MED ORDER — BISACODYL 10 MG RE SUPP
10.0000 mg | RECTAL | Status: AC
  Administered 2017-02-15: 10 mg via RECTAL
  Filled 2017-02-15: qty 1

## 2017-02-15 MED ORDER — WARFARIN SODIUM 7.5 MG PO TABS
7.5000 mg | ORAL_TABLET | Freq: Once | ORAL | Status: AC
  Administered 2017-02-15: 7.5 mg via ORAL
  Filled 2017-02-15: qty 1

## 2017-02-15 NOTE — Progress Notes (Signed)
ANTICOAGULATION CONSULT NOTE   Pharmacy Consult for Heparin  Indication: atrial fibrillation  No Known Allergies  Patient Measurements: Height: 5\' 6"  (167.6 cm) Weight: 156 lb 3.2 oz (70.9 kg) IBW/kg (Calculated) : 63.8 Heparin Dosing Weight: 68 kg  Labs: Recent Labs    02/12/17 0348  02/13/17 0955 02/13/17 2136 02/14/17 0258 02/14/17 0259 02/14/17 1217 02/14/17 2239  HGB 9.3*  --  10.2* 8.8* 9.3*  --   --   --   HCT 29.6*  --  32.7* 27.8* 29.3*  --   --   --   PLT 89*  --  106* 90* 85*  --   --   --   LABPROT 15.1  --  15.3*  --  16.3*  --   --   --   INR 1.20  --  1.22  --  1.32  --   --   --   HEPARINUNFRC 0.39   < > 1.07* 0.47 0.34  --  0.25* 0.33  CREATININE 1.22  --  1.79*  --   --  1.72*  --   --    < > = values in this interval not displayed.   Assessment: 71 y.o. male with Afib for heparin  Goal of Therapy:  Heparin level 0.3-0.7 units/ml Monitor platelets by anticoagulation protocol: Yes   Plan:  Continue Heparin at current rate Follow-up am labs.  Phillis Knack, PharmD, BCPS  02/15/2017 1:53 AM

## 2017-02-15 NOTE — Progress Notes (Signed)
Patient called his daughter this evening

## 2017-02-15 NOTE — Consult Note (Signed)
Consultation Note Date: 02/15/2017   Patient Name: Jeffrey Perez  DOB: 03/07/1946  MRN: [id number]  Age / Sex: 71 y.o., male  PCP: System, [place] Not In Referring Physician: Cristal Ford, DO  Reason for Consultation: Establishing goals of care and Psychosocial/spiritual support  HPI/Patient Profile: 71 y.o. male   admitted on 02/04/2017 with  a medical history of renal transplant/end-stage renal disease, gout, hypertension, anemia, who presented to the emergency department with complaints of left facial droop and concern for stroke.   Patient was recently admitted and discharged on 01/28/2017 for generalized weakness.   Code stroke called, CT head showed questionable mass. Neurology consulted.     Per Dr Renda Rolls note: Pathology has now confirmed diagnosis of glioblastoma.  The case was reviewed in brain tumor board this week.    The group reached consensus regarding overwhelming likelihood of leptomeningeal spread, given clinical and radiographic findings.  The pathology and leptomeningeal dissemination, combined with history of renal transplant, accompanying immunosuppression and lack of candidacy for most systemic therapies, makes his prognosis extremely poor.   At this time my recommendation to [name] was to pursue comfort care measures only.   Patient and his family faced advanced directive decisions and anticipatory care needs.   Clinical Assessment and Goals of Care:   This NP Wadie Lessen reviewed medical records, received report from team, assessed the patient and then meet at the patient's bedside along with his SO of 56 years/ Beryle Flock  to discuss diagnosis, prognosis, GOC, EOL wishes disposition and options.  A detailed discussion was had today regarding advanced directives.  Concepts specific to code status, artifical feeding and hydration, continued IV antibiotics and  rehospitalization was had.  The difference between a aggressive medical intervention path  and a palliative comfort care path for this patient at this time was had.  Values and goals of care important to patient and family were attempted to be elicited.  Patient understands the limited prognosis.  He has made decision to transition to [place] in order to spend his final days with his family in [place].  Concept of Hospice was discussed.  He is in agreement to hospice services and I took initial steps to trigger a hospice referral.  Below information shared with Kellie/CMRN.  CM to send clinical notes for review  [facility] [place] [facility] [place] Fax # [phone] Patient[place] [address]   Questions and concerns addressed.   Family encouraged to call with questions or concerns.  PMT will continue to support holistically.    HCPOA- patient tells me his daughter in [place] is his HPOA and he plans to speak with her tonight about his medical conditions    SUMMARY OF RECOMMENDATIONS    Code Status/Advance Care Planning:  DNR- documented today   Symptoms  - Constipation: Dulcolax suppository now  Additional Recommendations (Limitations, Scope, Preferences):  Full Comfort Care  Psycho-social/Spiritual:   Desire for further Chaplaincy support:no  Additional Recommendations: Education  on Hospice  Prognosis:   < 6 weeks  Discharge Planning: Home with Hospice      Primary Diagnoses: Present on Admission: . Facial droop . Brain mass . Brain tumor ([place])   I have reviewed the medical record, interviewed the patient and family, and examined the patient. The following aspects are pertinent.  Past Medical History:  Diagnosis Date  . Acute kidney injury ([place]) 01/25/2017   Archie Endo 01/25/2017  . Anemia   . Arthritis    'all over my bones" (01/26/2017)  . Coronary artery disease   . ESRD (end stage renal disease) on dialysis  [facility]) 1992-1994   "stopped when I got the transplant"  . Gout   . History of blood transfusion    "very many times" (01/26/2017)  . Hypertension    Social History   Socioeconomic History  . Marital status: Divorced    Spouse name: None  . Number of children: None  . Years of education: None  . Highest education level: None  Social Needs  . Financial resource strain: Somewhat hard  . Food insecurity - worry: Patient refused  . Food insecurity - inability: Patient refused  . Transportation needs - medical: Patient refused  . Transportation needs - non-medical: Patient refused  Occupational History  . None  Tobacco Use  . Smoking status: Former Smoker    Years: 5.00    Types: Cigarettes    Last attempt to quit: 1965    Years since quitting: 53.9  . Smokeless tobacco: Never Used  Substance and Sexual Activity  . Alcohol use: No  . Drug use: No  . Sexual activity: No  Other Topics Concern  . None  Social History Narrative  . None   History reviewed. No pertinent family history. Scheduled Meds: . cholecalciferol  1,000 Units Oral Daily  . docusate sodium  100 mg Oral BID  . doxazosin  4 mg Oral BID  . mycophenolate  360 mg Oral BID  . pantoprazole  40 mg Oral Daily  . pneumococcal 23 valent vaccine  0.5 mL Intramuscular Tomorrow-1000  . polyethylene glycol  17 g Oral Daily  . predniSONE  5 mg Oral Q breakfast  . senna-docusate  1 tablet Oral BID  . sodium chloride flush  3 mL Intravenous Q12H  . tacrolimus  1 mg Oral BID  . warfarin  7.5 mg Oral ONCE-1800  . Warfarin - Pharmacist Dosing Inpatient   Does not apply q1800   Continuous Infusions: . sodium chloride 250 mL (02/13/17 0556)  . sodium chloride 75 mL/hr at 02/13/17 2135  . heparin 1,200 Units/hr (02/14/17 2125)   PRN Meds:.sodium chloride, acetaminophen **OR** acetaminophen, bisacodyl, hydrALAZINE, HYDROcodone-acetaminophen, naphazoline-glycerin, ondansetron **OR** ondansetron (ZOFRAN) IV, promethazine,  sodium chloride flush, sodium phosphate Medications Prior to Admission:  Prior to Admission medications   Medication Sig Start Date End Date Taking? Authorizing Provider  cholecalciferol (VITAMIN D) 1000 units tablet Take 1,000 Units by mouth daily.   Yes [provider]  Cyanocobalamin (VITAMIN B-12 PO) Take by mouth daily.   Yes [provider]  doxazosin (CARDURA) 8 MG tablet Take 8 mg by mouth 2 (two) times daily.   Yes [provider]  Mycophenolate Sodium (MYFORTIC PO) Take 360 mg by mouth 2 (two) times daily.    Yes [provider]  predniSONE (DELTASONE) 5 MG tablet Take 10 mg (2 tablets) po Daily x 3 more days and then go back to 5 mg po Daily 01/28/17  Yes Raiford Noble  Latif, DO  Pyridoxine HCl (VITAMIN B-6 PO) Take by mouth daily.   Yes [provider]  tacrolimus (PROGRAF) 1 MG capsule Take 1 mg by mouth 2 (two) times daily.    Yes [provider]  warfarin (COUMADIN) 1 MG tablet Take 1 tablet (1 mg total) by mouth daily. Patient taking differently: Take 1 mg by mouth at bedtime.  01/28/17 01/28/18 Yes Sheikh, Omair Latif, DO  warfarin (COUMADIN) 5 MG tablet Take 5 mg by mouth at bedtime.    Yes [provider]   No Known Allergies Review of Systems  Constitutional: Positive for fatigue.  Gastrointestinal: Positive for constipation.  Neurological: Positive for weakness.    Physical Exam  Constitutional: He is oriented to person, place, and time. He appears well-developed.  Cardiovascular: Normal rate and regular rhythm.  Pulmonary/Chest: Effort normal and breath sounds normal.  Abdominal: Soft. Bowel sounds are normal.  Neurological: He is alert and oriented to person, place, and time.  Skin: Skin is warm and dry.    Vital Signs: BP (!) 111/53 (BP Location: Right Arm)   Pulse 81   Temp 98.5 F (36.9 C) (Oral)   Resp 17   Ht 5\' 6"  (1.676 m)   Wt 70.9 kg (156 lb 3.2 oz)   SpO2 99%   BMI 25.21 kg/m  Pain  Assessment: No/denies pain POSS *See Group Information*: S-Acceptable,Sleep, easy to arouse Pain Score: 0-No pain   SpO2: SpO2: 99 % O2 Device:SpO2: 99 % O2 Flow Rate: .O2 Flow Rate (L/min): 2 L/min  IO: Intake/output summary:   Intake/Output Summary (Last 24 hours) at 02/15/2017 1321 Last data filed at 02/15/2017 1950 Gross per 24 hour  Intake 609.62 ml  Output 800 ml  Net -190.38 ml    LBM: Last BM Date: 02/12/17 Baseline Weight: Weight: 65.1 kg (143 lb 9.6 oz) Most recent weight: Weight: 70.9 kg (156 lb 3.2 oz)     Palliative Assessment/Data: 50 %   Discussed with Dr Mickeal Skinner and Dr Ree Kida  Time In: 1500 Time Out: 1645 Time Total: 105 minutes Greater than 50%  of this time was spent counseling and coordinating care related to the above assessment and plan.  Signed by: Wadie Lessen, NP   Please contact Palliative Medicine Team phone at [phone] for questions and concerns.  For individual provider: See Shea Evans

## 2017-02-15 NOTE — Progress Notes (Signed)
PROGRESS NOTE    Jeffrey Perez  WUJ:[id number] DOB: [birth year] DOA: 02/04/2017 PCP: System, Pcp Not In   Chief Complaint  Patient presents with  . Facial Droop    Brief Narrative:  HPI on 02/04/2017 Jeffrey Perez is a 71 y.o. male with a medical history of renal transplant/end-stage renal disease, gout, hypertension, anemia, who presented to the emergency department with complaints of left facial droop and concern for stroke. Patient was recently admitted and discharged on 01/28/2017 for generalized weakness. Patient reports that since leaving the hospital last week, he started having some left-sided weakness of his face. Family friend present at bedside, reported that she noticed it was worse today. Patient also reported some difficulty walking and dizziness. Denies any recent illness, travel. Denies chest pain, shortness of breath, abdominal pain, nausea vomiting, diarrhea or constipation, changes in urinary or bowel habits.   Assessment & Plan   Left facial droop/Brain lesion- Glioblastoma -CT head: Abnormal appearance of both anterior inferior frontal lobes and anterior corpus callosum, resembling a tumor which has crossed midline rather than infarct.  -MRI with and without contrast: Heterogeneously enhancing mass lesion in the anterior inferior frontal lobes crosses midline, measures 5.3 x 3.6 x 4.0 cm -Neurology consulted and appreciated -Neurosurgery consulted and appreciated, s/p stereotactic brain biopsy. Biopsy results show glioblastoma, high-grade -Interventional radiology consulted and appreciated, however no longer needed lumbar puncture -Continue eye drops as he complains that his left eye is burning (as he cannot blink) -Neuro-oncology consulted and appreciated, recommended palliative care -palliative care consulted and appreciated, meeting planned for today  History of renal transplant/CKD -Transplant occurred 25 years ago, no longer on dialysis -Unknown baseline  creatinine, currently 1.33 -Continue monitor BMP -Continue mycophenolate, prednisone, tacrolimus  Paroxysmal atrial fibrillation -Currently in sinus rhythm -CHADSVASC 2 (based on age and HTN) -INR now 1.46 -Continue heparin and coumadin per pharmacy -may consider discharging patient home with lovenox bridge, however, given that patient is in sinus rhythm, may not need bridging   Chronic mesenteric anemia/thrombocytopenia -Hemoglobin stable, currently 10 -platelet currently stable, 90 -Continue to monitor CBC  Essential hypertension -Continue Cardura, dose reduced to 4mg  BID  Nausea and vomiting/Constipation -Continue stool softener  -appears to have improved  Deconditioning -Likely from the above, PT and OT recommend home health, rolling walker  DVT Prophylaxis  Coumadin/heparin  Code Status: Full  Family Communication: partner at bedside  Disposition Plan: Admitted. Pending palliative care consult and recommendations  Consultants Neurology Neurosurgery Interventional radiology Neuro-oncology Palliative care   Procedures  Stereotatic brain biopsy  Antibiotics   Anti-infectives (From admission, onward)   Start     Dose/Rate Route Frequency Ordered Stop   02/09/17 2030  ceFAZolin (ANCEF) IVPB 2g/100 mL premix     2 g 200 mL/hr over 30 Minutes Intravenous Every 8 hours 02/09/17 1457 02/10/17 0505   02/09/17 0800  ceFAZolin (ANCEF) IVPB 2g/100 mL premix     2 g 200 mL/hr over 30 Minutes Intravenous To [facility] 02/08/17 2226 02/09/17 1205   02/09/17 0600  ceFAZolin (ANCEF) IVPB 2g/100 mL premix  Status:  Discontinued     2 g 200 mL/hr over 30 Minutes Intravenous On call to O.R. 02/08/17 2231 02/09/17 0738      Subjective:   Jeffrey Perez seen and examined today.  Patient denies chest pain, shortness of breath, abdominal pain, nausea, vomiting, diarrhea, constipation.   Objective:   Vitals:   02/15/17 0012 02/15/17 0538 02/15/17 0926 02/15/17  1405  BP: (!) 150/77 126/85 Marland Kitchen)  111/53 124/75  Pulse: (!) 55 (!) 54 81 86  Resp: 18 16 17 18   Temp: 97.9 F (36.6 C) 97.8 F (36.6 C) 98.5 F (36.9 C) (!) 97.4 F (36.3 C)  TempSrc: Oral Oral Oral Oral  SpO2: 96% 99% 99% 97%  Weight:      Height:        Intake/Output Summary (Last 24 hours) at 02/15/2017 1523 Last data filed at 02/15/2017 1500 Gross per 24 hour  Intake 288 ml  Output 800 ml  Net -512 ml   Filed Weights   02/06/17 1344 02/09/17 1500 02/11/17 0623  Weight: 66 kg (145 lb 8.1 oz) 68.2 kg (150 lb 5.7 oz) 70.9 kg (156 lb 3.2 oz)   Exam (no change from exam on 02/14/2017)  General: Well developed, well nourished, NAD, appears stated age  15: NCAT, mucous membranes moist. Left facial droop  Cardiovascular: S1 S2 auscultated, +SEM, RRR  Respiratory: Clear to auscultation bilaterally with equal chest rise  Abdomen: Soft, nontender, nondistended, + bowel sounds  Extremities: warm dry without cyanosis clubbing or edema  Neuro: AAOx3, left hemianopsia with facial weakness  Psych: Appropriate mood and affect  Data Reviewed: I have personally reviewed following labs and imaging studies  CBC: Recent Labs  Lab 02/12/17 0348 02/13/17 0955 02/13/17 2136 02/14/17 0258 02/15/17 0320  WBC 3.2* 4.2 3.5* 3.0* 2.7*  HGB 9.3* 10.2* 8.8* 9.3* 10.0*  HCT 29.6* 32.7* 27.8* 29.3* 31.9*  MCV 90.2 90.8 89.7 91.0 91.7  PLT 89* 106* 90* 85* 90*   Basic Metabolic Panel: Recent Labs  Lab 02/08/17 2342 02/10/17 0438 02/11/17 0427 02/12/17 0348 02/13/17 0955 02/14/17 0259 02/15/17 0322  NA 138 137 140 138 135 137 137  K 3.9 4.2 3.9 3.7 4.7 3.9 3.8  CL 110 110 113* 109 107 110 108  CO2 23 20* 22 23 21* 21* 22  GLUCOSE 100* 118* 85 103* 154* 95 92  BUN 37* 28* 25* 18 22* 26* 20  CREATININE 1.69* 1.52* 1.40* 1.22 1.79* 1.72* 1.33*  CALCIUM 8.8* 8.3* 8.5* 8.5* 8.5* 8.3* 8.7*  MG 1.9 1.7  --   --   --   --   --   PHOS  --   --   --   --   --  2.4* 2.7    GFR: Estimated Creatinine Clearance: 44.6 mL/min (A) (by C-G formula based on SCr of 1.33 mg/dL (H)). Liver Function Tests: Recent Labs  Lab 02/14/17 0259 02/15/17 0322  ALBUMIN 2.8* 3.0*   No results for input(s): LIPASE, AMYLASE in the last 168 hours. No results for input(s): AMMONIA in the last 168 hours. Coagulation Profile: Recent Labs  Lab 02/11/17 0427 02/12/17 0348 02/13/17 0955 02/14/17 0258 02/15/17 0320  INR 1.18 1.20 1.22 1.32 1.46   Cardiac Enzymes: No results for input(s): CKTOTAL, CKMB, CKMBINDEX, TROPONINI in the last 168 hours. BNP (last 3 results) No results for input(s): PROBNP in the last 8760 hours. HbA1C: No results for input(s): HGBA1C in the last 72 hours. CBG: Recent Labs  Lab 02/09/17 1602 02/09/17 1939  GLUCAP 118* 222*   Lipid Profile: No results for input(s): CHOL, HDL, LDLCALC, TRIG, CHOLHDL, LDLDIRECT in the last 72 hours. Thyroid Function Tests: No results for input(s): TSH, T4TOTAL, FREET4, T3FREE, THYROIDAB in the last 72 hours. Anemia Panel: No results for input(s): VITAMINB12, FOLATE, FERRITIN, TIBC, IRON, RETICCTPCT in the last 72 hours. Urine analysis:    Component Value Date/Time   COLORURINE YELLOW 01/25/2017 1814  APPEARANCEUR CLEAR 01/25/2017 1814   LABSPEC 1.008 01/25/2017 1814   PHURINE 6.0 01/25/2017 1814   GLUCOSEU NEGATIVE 01/25/2017 1814   HGBUR NEGATIVE 01/25/2017 1814   BILIRUBINUR NEGATIVE 01/25/2017 1814   KETONESUR NEGATIVE 01/25/2017 1814   PROTEINUR NEGATIVE 01/25/2017 1814   UROBILINOGEN 0.2 05/10/2013 1015   NITRITE NEGATIVE 01/25/2017 1814   LEUKOCYTESUR NEGATIVE 01/25/2017 1814   Sepsis Labs: @LABRCNTIP (procalcitonin:4,lacticidven:4)  ) Recent Results (from the past 240 hour(s))  Surgical pcr screen     Status: None   Collection Time: 02/08/17 10:41 PM  Result Value Ref Range Status   MRSA, PCR NEGATIVE NEGATIVE Final   Staphylococcus aureus NEGATIVE NEGATIVE Final    Comment: (NOTE) The  Xpert SA Assay (FDA approved for NASAL specimens in patients 45 years of age and older), is one component of a comprehensive surveillance program. It is not intended to diagnose infection nor to guide or monitor treatment.       Radiology Studies: Dg Cervical Spine 2 Or 3 Views  Result Date: 02/13/2017 CLINICAL DATA:  Pt c/o neck pain that radiates into both of his shoulders x 1 day; no injury to the area. Best obtainable images due to pt condition. No hx of prior injuries or surgeries to the area. EXAM: CERVICAL SPINE - 2-3 VIEW COMPARISON:  None. FINDINGS: There is depression of the upper endplate of C4, which appears chronic. There is no associated soft tissue swelling. All remaining vertebra are normal in height. There is a grade 1 anterolisthesis of C7 on T1. This appears degenerative in origin. There is moderate-to-marked loss of disc height from C3-C4 through C6-C7. Endplate spurring is noted at all of these levels. The bones are diffusely demineralized. Soft tissues are unremarkable. IMPRESSION: 1. No acute fractures or acute finding. 2. Significant degenerative changes noted throughout the cervical spine. Chronic appearing loss of vertebral body height at C4. Electronically Signed   By: Lajean Manes M.D.   On: 02/13/2017 20:05     Scheduled Meds: . cholecalciferol  1,000 Units Oral Daily  . docusate sodium  100 mg Oral BID  . doxazosin  4 mg Oral BID  . mycophenolate  360 mg Oral BID  . pantoprazole  40 mg Oral Daily  . pneumococcal 23 valent vaccine  0.5 mL Intramuscular Tomorrow-1000  . polyethylene glycol  17 g Oral Daily  . predniSONE  5 mg Oral Q breakfast  . senna-docusate  1 tablet Oral BID  . sodium chloride flush  3 mL Intravenous Q12H  . tacrolimus  1 mg Oral BID  . warfarin  7.5 mg Oral ONCE-1800  . Warfarin - Pharmacist Dosing Inpatient   Does not apply q1800   Continuous Infusions: . sodium chloride 250 mL (02/13/17 0556)  . sodium chloride 75 mL/hr at 02/13/17  2135  . heparin 1,200 Units/hr (02/14/17 2125)     LOS: 9 days   Time Spent in minutes   30 minutes  Ibrahem Volkman D.O. on 02/15/2017 at 3:23 PM  Between 7am to 7pm - Pager - [phone]  After 7pm go to [email or web address] - password [email or web address]  And look for the night coverage person covering for me after hours  [facility] Office  [phone]

## 2017-02-15 NOTE — Progress Notes (Signed)
ANTICOAGULATION CONSULT NOTE - Follow Up Consult  Pharmacy Consult for Heparin and Coumadin Indication: atrial fibrillation  No Known Allergies  Patient Measurements: Height: 5\' 6"  (167.6 cm) Weight: 156 lb 3.2 oz (70.9 kg) IBW/kg (Calculated) : 63.8 Heparin Dosing Weight: 71 kg  Vital Signs: Temp: 98.5 F (36.9 C) (11/14 0926) Temp Source: Oral (11/14 0926) BP: 111/53 (11/14 0926) Pulse Rate: 81 (11/14 0926)  Labs: Recent Labs    02/13/17 0955 02/13/17 2136 02/14/17 0258 02/14/17 0259 02/14/17 1217 02/14/17 2239 02/15/17 0320 02/15/17 0322  HGB 10.2* 8.8* 9.3*  --   --   --  10.0*  --   HCT 32.7* 27.8* 29.3*  --   --   --  31.9*  --   PLT 106* 90* 85*  --   --   --  90*  --   LABPROT 15.3*  --  16.3*  --   --   --  17.6*  --   INR 1.22  --  1.32  --   --   --  1.46  --   HEPARINUNFRC 1.07* 0.47 0.34  --  0.25* 0.33 0.47  --   CREATININE 1.79*  --   --  1.72*  --   --   --  1.33*    Estimated Creatinine Clearance: 44.6 mL/min (A) (by C-G formula based on SCr of 1.33 mg/dL (H)).  Assessment:  71 year old male seen in ED on 02/04/17 with facial drop- had been worsening over last several days to 1 week. Stroke ruled out. Imaging showed mass in frontal lobe and lesions in 4th ventricle. PTA Coumadin held for biopsy, which was done on 11/8. New dx glioblastoma.    Heparin level remains therapeutic (0.47) on 1200 units/hr. Platelet count low stable.   INR up to 1.46 today after Coumadin 6 mg x 2 days then 7.5 mg yesterday.   Coumadin regimen PTA: 6 mg daily. INR 3.23 on admit 11/5. Vitamin K 2.5 mg IV given on 11/7.   Goal of Therapy:  INR 2-3 Heparin level 0.3-0.7 units/ml Monitor platelets by anticoagulation protocol: Yes   Plan:   Continue heparin drip at 1200 units/hr.  Coumadin 7.5 mg x 1 again today.  Daily heparin level, PT/INR and CBC.  Arty Baumgartner, [place] Pager: [phone]. Or [id number] 02/15/2017,1:16 PM

## 2017-02-16 DIAGNOSIS — H02402 Unspecified ptosis of left eyelid: Secondary | ICD-10-CM

## 2017-02-16 LAB — CBC
HEMATOCRIT: 31.8 % — AB (ref 39.0–52.0)
Hemoglobin: 10.3 g/dL — ABNORMAL LOW (ref 13.0–17.0)
MCH: 29 pg (ref 26.0–34.0)
MCHC: 32.4 g/dL (ref 30.0–36.0)
MCV: 89.6 fL (ref 78.0–100.0)
PLATELETS: 89 10*3/uL — AB (ref 150–400)
RBC: 3.55 MIL/uL — ABNORMAL LOW (ref 4.22–5.81)
RDW: 14.2 % (ref 11.5–15.5)
WBC: 2.8 10*3/uL — AB (ref 4.0–10.5)

## 2017-02-16 LAB — RENAL FUNCTION PANEL
ALBUMIN: 3.1 g/dL — AB (ref 3.5–5.0)
ANION GAP: 6 (ref 5–15)
BUN: 18 mg/dL (ref 6–20)
CALCIUM: 8.9 mg/dL (ref 8.9–10.3)
CO2: 23 mmol/L (ref 22–32)
CREATININE: 1.3 mg/dL — AB (ref 0.61–1.24)
Chloride: 107 mmol/L (ref 101–111)
GFR calc Af Amer: >60 mL/min (ref >60)
GFR calc non Af Amer: 53 mL/min — ABNORMAL LOW (ref >60)
GLUCOSE: 109 mg/dL — AB (ref 65–99)
PHOSPHORUS: 2.8 mg/dL (ref 2.5–4.6)
Potassium: 3.8 mmol/L (ref 3.5–5.1)
SODIUM: 136 mmol/L (ref 135–145)

## 2017-02-16 LAB — PROTIME-INR
INR: 1.71
Prothrombin Time: 19.9 seconds — ABNORMAL HIGH (ref 11.4–15.2)

## 2017-02-16 LAB — HEPARIN LEVEL (UNFRACTIONATED): HEPARIN UNFRACTIONATED: 0.63 [IU]/mL (ref 0.30–0.70)

## 2017-02-16 MED ORDER — PREDNISONE 5 MG PO TABS: 5.0000 mg | ORAL_TABLET | Freq: Every day | ORAL | Status: AC

## 2017-02-16 MED ORDER — DOXAZOSIN MESYLATE 4 MG PO TABS: 4.0000 mg | ORAL_TABLET | Freq: Two times a day (BID) | ORAL | 0 refills | Status: AC

## 2017-02-16 MED ORDER — ENOXAPARIN SODIUM 80 MG/0.8ML ~~LOC~~ SOLN
70.0000 mg | Freq: Once | SUBCUTANEOUS | Status: AC
  Administered 2017-02-16: 70 mg via SUBCUTANEOUS
  Filled 2017-02-16: qty 0.8

## 2017-02-16 MED ORDER — WARFARIN SODIUM 7.5 MG PO TABS
7.5000 mg | ORAL_TABLET | Freq: Once | ORAL | Status: AC
  Administered 2017-02-16: 7.5 mg via ORAL
  Filled 2017-02-16: qty 1

## 2017-02-16 MED ORDER — POLYVINYL ALCOHOL 1.4 % OP SOLN
2.0000 [drp] | OPHTHALMIC | Status: DC | PRN
  Administered 2017-02-16: 2 [drp] via OPHTHALMIC
  Filled 2017-02-16: qty 15

## 2017-02-16 MED ORDER — PANTOPRAZOLE SODIUM 40 MG PO TBEC: 40.0000 mg | DELAYED_RELEASE_TABLET | Freq: Every day | ORAL | 0 refills | Status: AC

## 2017-02-16 NOTE — Progress Notes (Signed)
Patient partner returned with their pastor. She notified RN that she wanted the pastor to pray for them before they left for [place]. She transported the pastor back and notified RN that "we will be ready to discharge when I get back"

## 2017-02-16 NOTE — Care Management (Addendum)
Wheelchair ordered and Brad with [facility] DME notified and will deliver to the room. Pt has appointment in the am to have INR checked at [facility]. Bedside RN to reinforce to keep this appointment.  MD placed orders for [facility] services. CM spoke with MD and patient will only be in [place] until Saturday. HH would not make a visit prior to Sat. MD aware that [facility] services will not be arranged and she is in agreement.

## 2017-02-16 NOTE — Progress Notes (Signed)
Called Neurosurgery to notify of patient's travel to [place]. MD states that patient does not need to follow up with him-"remove guaze, dressing is absorbable"

## 2017-02-16 NOTE — Progress Notes (Signed)
The plan is for patient to return to [place] where his family is located and be followed by home hospice. Palliative care initiated contact with [facility] and [facility] yesterday. CM was provided their information and faxed them the required clinical information on the patient.  CM received a phone call from [place] with [place] and [place] asking about who would be providing his care in [place] and if he would be continuing immunosuppressive medications. CM provided her with the name of patients significant other and called Dr Ree Kida to see if immunosuppressive meds would continue at d/c. Per Dr Ree Kida patient wishes to continue with these meds. Magda Paganini informed and the patient's significant other also made aware that hospice will not cover these meds.  Significant other requesting wheelchair at d/c. Dr Ree Kida informed.

## 2017-02-16 NOTE — Progress Notes (Signed)
D/c reviewed with patient and partner. Emphasis is placed on the importance on follow up appointments. Partner asks several times why she has to "do this so early in the morning". She is encouraged to take PT to his appointment. Patient understands and was able to verbally state why his INR has to be checked. Partner asks if she can get a bedside commode-Case Management is called to notify No further questions at this time

## 2017-02-16 NOTE — Progress Notes (Signed)
CM reached out to [facility] in [place] to see if they could say they are accepting him for home hospice. They have faxed his information to the patients primary care PCP (Dr Margarito Courser) and are awaiting verification that he will sign hospice orders.  CM provided the hospice the number for Margerite, the patients significant other, as the contact once they have verification from Dr Margarito Courser.

## 2017-02-16 NOTE — Discharge Summary (Signed)
Physician Discharge Summary  [name] LPF:[id number] DOB: 15-Jun-1945 DOA: 02/04/2017  PCP: Jeffrey Perez, Pcp Not In  Admit date: 02/04/2017 Discharge date: 02/16/2017  Time spent: 45 minutes  Recommendations for Outpatient Follow-up:  Patient will be discharged to home with hospice to follow.  Patient will need to follow up with primary care provider within one week of discharge- appointment on 02/17/2017 at 8:30am. Patient should continue medications as prescribed.  Patient should follow a soft diet.   Discharge Diagnoses:  Left facial droop/Brain lesion- Glioblastoma History of renal transplant/CKD Paroxysmal atrial fibrillation Chronic mesenteric anemia/thrombocytopenia Essential hypertension Nausea and vomiting/Constipation Deconditioning  Discharge Condition: stable  Diet recommendation: soft  Filed Weights   02/06/17 1344 02/09/17 1500 02/11/17 0623  Weight: 66 kg (145 lb 8.1 oz) 68.2 kg (150 lb 5.7 oz) 70.9 kg (156 lb 3.2 oz)    History of present illness:  on 02/04/2017 Jeffrey Perez a 71 y.o.malewith a medical history ofrenal transplant/end-stage renal disease, gout, hypertension, anemia, who presented to the emergency department with complaints of left facial droop and concern for stroke. Patient was recently admitted and discharged on 01/28/2017 for generalized weakness. Patient reports that since leaving the hospital last week, he started having some left-sided weakness of his face. Family friend present at bedside, reported that she noticed it was worse today. Patient also reported some difficulty walking and dizziness. Denies any recent illness, travel. Denies chest pain, shortness of breath, abdominal pain, nausea vomiting, diarrhea or constipation, changes in urinary or bowelhabits.  Hospital Course:  Left facial droop/Brain lesion- Glioblastoma -CT head:Abnormalappearance of both anterior inferior frontal lobes and anterior corpus callosum, resembling a  tumor which has crossed midline rather than infarct.  -MRI with and without contrast: Heterogeneously enhancing mass lesion in the anterior inferior frontal lobes crosses midline, measures 5.3 x 3.6 x 4.0 cm -Neurology consulted and appreciated -Neurosurgery consulted and appreciated, s/p stereotactic brain biopsy. Biopsy results show glioblastoma, high-grade -Interventional radiology consulted and appreciated, however no longer needed lumbar puncture -Continue eye drops as he complains that his left eye is burning (as he cannot blink) -Neuro-oncology consulted and appreciated, recommended palliative care -palliative care consulted and appreciated  History of renal transplant/CKD -Transplant occurred 25 years ago, no longer on dialysis -Unknown baseline creatinine, currently1.30 -Continue mycophenolate, prednisone, tacrolimus  Paroxysmal atrial fibrillation -Currently in sinus rhythm -CHADSVASC 2 (based on age and HTN) -INR now 1.71 -was placd heparin and coumadin per pharmacy -given that he has GBM, this increases his risk of coagulopathy, however, GBM may bleed with continuous lovenox injections -prior to discharge, will give one dose of lovenox and 7.5mg  of coumadin -[facility] 02/17/2017 at 8:30am for follow up and INR check  Chronic mesenteric anemia/thrombocytopenia -Hemoglobinstable, currently 10.3 -platelet currently stable, 89 -Continue to monitor CBC  Essential hypertension -Continue Cardura, dose reduced to 4mg  BID  Nausea and vomiting/Constipation -Continue stool softener  -appears to have improved  Deconditioning -Likely from the above, PT and OT recommend home health, rolling walker, wheelchair with cushion -Family will be taking patient back to [place] on 02/18/2017    Durable Medical Equipment  (From admission, onward)        Start     Ordered   02/16/17 0000  For home use only DME lightweight manual  wheelchair with seat cushion    Comments:  Patient suffers from glioblastoma, which impairs their ability to perform daily activities.  A walker will not resolve  issue with performing  activities of daily living. A wheelchair will allow patient to safely perform daily activities. Patient is not able to propel themselves in the home using a standard weight wheelchair due to weakness. Patient can self propel in the lightweight wheelchair.  Accessories: elevating leg rests (ELRs), wheel locks, extensions and anti-tippers.   02/16/17 1322     Goals of care -Palliative care consulted and appreciated, patient now DNR -plan for home with hospice  Consultants Neurology Neurosurgery Interventional radiology Neuro-oncology Palliative care   Procedures  Stereotatic brain biopsy  Discharge Exam: Vitals:   02/16/17 1014 02/16/17 1302  BP: 116/64 104/74  Pulse: 64 (!) 52  Resp:  16  Temp: 98.9 F (37.2 C) 99.4 F (37.4 C)  SpO2: 96% 95%     General: Well developed, well nourished, NAD, appears stated age  71: NCAT, mucous membranes moist. Left facial droop  Cardiovascular: S1 S2 auscultated, +murmur, RRR  Respiratory: Clear to auscultation bilaterally   Abdomen: Soft, nontender, nondistended, + bowel sounds  Extremities: warm dry without cyanosis clubbing or edema  Neuro: AAOx3, left hemianopsia with facial weakness  Psych: Appropriate mood and affect  Discharge Instructions Discharge Instructions    Discharge instructions   Complete by:  As directed    Patient will be discharged to home with hospice to follow.  Patient will need to follow up with primary care provider within one week of discharge- appointment on 02/17/2017 at 8:30am. Patient should continue medications as prescribed.  Patient should follow a soft diet.   Restart your coumadin on 02/17/2017.   For home use only DME lightweight manual wheelchair with seat cushion   Complete by:  As directed    Patient  suffers from glioblastoma, which impairs their ability to perform daily activities.  A walker will not resolve  issue with performing activities of daily living. A wheelchair will allow patient to safely perform daily activities. Patient is not able to propel themselves in the home using a standard weight wheelchair due to weakness. Patient can self propel in the lightweight wheelchair.  Accessories: elevating leg rests (ELRs), wheel locks, extensions and anti-tippers.     Current Discharge Medication List    START taking these medications   Details  pantoprazole (PROTONIX) 40 MG tablet Take 1 tablet (40 mg total) daily by mouth. Qty: 30 tablet, Refills: 0      CONTINUE these medications which have CHANGED   Details  doxazosin (CARDURA) 4 MG tablet Take 1 tablet (4 mg total) 2 (two) times daily by mouth. Qty: 60 tablet, Refills: 0    predniSONE (DELTASONE) 5 MG tablet Take 1 tablet (5 mg total) daily with breakfast by mouth.      CONTINUE these medications which have NOT CHANGED   Details  cholecalciferol (VITAMIN D) 1000 units tablet Take 1,000 Units by mouth daily.    Cyanocobalamin (VITAMIN B-12 PO) Take by mouth daily.    Mycophenolate Sodium (MYFORTIC PO) Take 360 mg by mouth 2 (two) times daily.     Pyridoxine HCl (VITAMIN B-6 PO) Take by mouth daily.    tacrolimus (PROGRAF) 1 MG capsule Take 1 mg by mouth 2 (two) times daily.     !! warfarin (COUMADIN) 1 MG tablet Take 1 tablet (1 mg total) by mouth daily. Qty: 30 tablet, Refills: 11    !! warfarin (COUMADIN) 5 MG tablet Take 5 mg by mouth at bedtime.      !! - Potential duplicate medications found. Please discuss with provider.  No Known Allergies Follow-up Information    Ditty, Kevan Ny, MD. Schedule an appointment as soon as possible for a visit in 3 week(s).   Specialty:  Neurosurgery Contact information: [address] [place] [address] [phone]        [facility]  Follow up on 02/17/2017.   Why:  Your appointment is 8:30 am. Please arrive 15 min early and bring a picture ID, current medications and insurance card. Contact information:  [address] [place],  [address]  [phone]  PCP appointment and Coumadin level check           The results of significant diagnostics from this hospitalization (including imaging, microbiology, ancillary and laboratory) are listed below for reference.    Significant Diagnostic Studies: Dg Chest 2 View  Result Date: 01/25/2017 CLINICAL DATA:  Fatigue and weakness EXAM: CHEST  2 VIEW COMPARISON:  None. FINDINGS: Post sternotomy changes. Linear atelectasis or scarring at both lung bases. No acute consolidation or effusion. Mild cardiomegaly. Ectatic tortuous aorta with atherosclerosis. No pneumothorax. Right paratracheal opacity. IMPRESSION: 1. Linear scarring or atelectasis at the bases 2. Mild cardiomegaly. Borderline to slightly enlarged mediastinum. In addition there is a vague right paratracheal opacity ; CT suggested for further evaluation. Electronically Signed   By: Donavan Foil M.D.   On: 01/25/2017 20:30   Dg Cervical Spine 2 Or 3 Views  Result Date: 02/13/2017 CLINICAL DATA:  Pt c/o neck pain that radiates into both of his shoulders x 1 day; no injury to the area. Best obtainable images due to pt condition. No hx of prior injuries or surgeries to the area. EXAM: CERVICAL SPINE - 2-3 VIEW COMPARISON:  None. FINDINGS: There is depression of the upper endplate of C4, which appears chronic. There is no associated soft tissue swelling. All remaining vertebra are normal in height. There is a grade 1 anterolisthesis of C7 on T1. This appears degenerative in origin. There is moderate-to-marked loss of disc height from C3-C4 through C6-C7. Endplate spurring is noted at all of these levels. The bones are diffusely demineralized. Soft tissues are unremarkable. IMPRESSION: 1. No acute fractures or acute  finding. 2. Significant degenerative changes noted throughout the cervical spine. Chronic appearing loss of vertebral body height at C4. Electronically Signed   By: Lajean Manes M.D.   On: 02/13/2017 20:05   Ct Head Wo Contrast  Result Date: 02/07/2017 CLINICAL DATA:  71 y/o  M; brain mass. EXAM: CT HEAD WITHOUT CONTRAST TECHNIQUE: Contiguous axial images were obtained from the base of the skull through the vertex without intravenous contrast. COMPARISON:  02/04/2017 CT head and MRI head. FINDINGS: Brain: Small masses extending into the lower fourth ventricle along posterior brainstem and right anterior vermis (series 3, image 52). Mass involving medial frontal lobes crossing splenium of corpus callosum surrounding edema. Mass effect effaces frontal horns of lateral ventricles. No acute intracranial hemorrhage or herniation. Vascular: No hyperdense vessel or unexpected calcification. Skull: Normal. Negative for fracture or focal lesion. Sinuses/Orbits: No acute finding. Other: None. IMPRESSION: 1. Large mass involving medial frontal lobes and genu of corpus callosum best delineated on postcontrast MRI brain. Mass effect with effacement of frontal horns of lateral ventricles. 2. Two additional small masses within the margins of lower fourth ventricle. 3. No acute intracranial abnormality. Electronically Signed   By: Kristine Garbe M.D.   On: 02/07/2017 14:33   Ct Chest Wo Contrast  Result Date: 01/25/2017 CLINICAL DATA:  Fatigue and  weakness for the past 1.5 weeks. Right paratracheal opacity on chest radiographs earlier today. EXAM: CT CHEST WITHOUT CONTRAST TECHNIQUE: Multidetector CT imaging of the chest was performed following the standard protocol without IV contrast. COMPARISON:  Chest radiographs obtained earlier today. FINDINGS: Cardiovascular: Dense atheromatous calcifications, including extensive coronary artery calcifications as well as aortic calcifications. Borderline enlarged heart.  Dense mitral valve annulus calcifications. Tortuous aorta and innominate artery, corresponding to the right paratracheal density seen on the chest radiographs earlier today. No mass is demonstrated at that location. Mediastinum/Nodes: No enlarged lymph nodes. Multiple tiny bilateral thyroid gland nodules. The largest is on the left, measuring 4 mm in maximum diameter. Lungs/Pleura: Small right lower lobe calcified granuloma. Minimal diffuse bilateral bullous changes. Bilateral linear atelectasis or scarring. Upper Abdomen: Dense atheromatous arterial calcifications. Severely atrophied right kidney. The left kidney is not included. Musculoskeletal: Thoracic and lower cervical spine degenerative changes. IMPRESSION: 1. The right paratracheal density seen earlier today is a tortuous innominate artery. 2. Extensive dense calcific coronary artery and aortic atherosclerosis. 3. Minimal changes of COPD with centrilobular emphysema. 4. Sub-centimeter thyroid nodule(s) noted, too small to characterize, but most likely benign in the absence of known clinical risk factors for thyroid carcinoma. 5. Severely atrophied right kidney. The left kidney is not included or absent. Aortic Atherosclerosis (ICD10-I70.0) and Emphysema (ICD10-J43.9). Electronically Signed   By: Claudie Revering M.D.   On: 01/25/2017 23:07   Mr Jeri Cos And Wo Contrast  Result Date: 02/04/2017 CLINICAL DATA:  Focal neuro deficit for greater than 6 hours. Abnormal CT of the head. Left facial droop and abnormal gait. Unknown time of onset. EXAM: MRI HEAD WITHOUT AND WITH CONTRAST TECHNIQUE: Multiplanar, multiecho pulse sequences of the brain and surrounding structures were obtained without and with intravenous contrast. CONTRAST:  <See Chart> MULTIHANCE GADOBENATE DIMEGLUMINE 529 MG/ML IV SOLN COMPARISON:  CT head without contrast from the same day. FINDINGS: Brain: Enhancing mass lesion centered in the inferior right frontal lobe across seen midline. There is  involvement of the anterior cingulate gyrus as well as the rostrum any anterior genu of the corpus callosum. Anterior cerebral arteries extend through the lesion. The is slightly hyperintense on T2 weighted images with extensive heterogeneity. There is diffuse enhancement. The lesion measures 5.3 x 4.0 x 3.6 cm. The lesion involves the rostrum any anterior genu of the corpus callosum. The lesion appears to be centered inferior to the ventricles but extends superiorly in the frontal lobes, right greater than left. Surrounding vasogenic edema is confirmed. While there is some suggestion of CSF cleft along the superior aspect of the tumor, the lesion is inseparable from the corpus callosum on the postcontrast images. Mild restricted diffusion is present within the lesion. A 0.5 x 0.7 x 1.2 cm lesion is present along the posterior aspect brainstem and ventral surface of the fourth ventricle. There is involvement of the right inferior vermis extending posteriorly. No other discrete foci of enhancement are present. There is no leptomeningeal enhancement. Moderate bilateral subcortical T2 signal changes are present bilaterally. 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 Vascular: Flow is present in the major intracranial arteries. Skull and upper cervical spine: The skullbase is within normal limits. The craniocervical junction is normal. There is fusion across the C3-4 disc space. Marked degenerative changes are present at C4-5 and C5-6. Marrow signal is otherwise normal for age. Sinuses/Orbits: Mild mucosal thickening is present in the inferior maxillary sinuses and scattered throughout the ethmoid air cells. No  significant fluid levels are present. Mastoid air cells are clear. Bilateral lens replacements are present. The globes and orbits are otherwise within normal limits. IMPRESSION: 1. Heterogeneously enhancing mass lesion in the anterior inferior frontal lobes crosses midline. The tumor measures 5.3  by 3.6 x 4.0 cm. This lesion may be extra-axial. 2. Similar enhancing lesions with restricted diffusion are noted in the fourth ventricle and involving the right side of the inferior cerebellar vermis. 3. 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 Metastatic disease is considered less likely. The diffusion-weighted images do not show the typical level of restricted diffusion seen with lymphoma. Lumbar puncture may be useful for diagnosis given the likelihood of CSF tumor spread. MRI of the lumbar spine without and with contrast could also be utilized to evaluate for distal tumor spread. 4. Diffuse subcortical white matter changes bilaterally likely reflect the sequela of chronic microvascular ischemia. These results were called by telephone at the time of interpretation on 02/04/2017 at 7:16 pm to Dr. Rory Percy, who verbally acknowledged these results. Electronically Signed   By: San Morelle M.D.   On: 02/04/2017 19:17   US Renal  Result Date: 01/26/2017 CLINICAL DATA:  Acute kidney injury EXAM: RENAL / URINARY TRACT ULTRASOUND COMPLETE COMPARISON:  Overlapping portions of CT chest 01/25/2017 FINDINGS: Right Kidney: Length: 6.5 cm. Severe cortical thinning. Echogenic cortex. No obvious mass but the kidney is only indistinctly seen and the cortex seems to have similar echogenicity to the surrounding renal sinus and perirenal adipose tissue. No definite stones. No hydronephrosis. Left Kidney: Length: 6.3 cm. Severely atrophic and echogenic. No obvious stones or mass. Pelvic transplant kidney: Length: 10.4 cm. No hydronephrosis, calculi, or discrete mass. There is a 1.8 by 1.2 by 1.7 cm simple appearing cyst in the transplant left kidney. Bladder: Mild diffuse wall irregularity probably due to nondistention. Prostate gland measured at 4.2 by 4.0 by 4.1 cm (volume = 36 cm^3). IMPRESSION: 1. No hydronephrosis or discrete abnormality of the  pelvic transplant kidney aside from a 1.8 cm in long axis simple appearing cyst. 2. The native kidneys are severely atrophic and echogenic. 3. There is slight luminal irregularity in the urinary bladder diffusely, favoring nondistention over cystitis, correlate with urine analysis. Electronically Signed   By: Van Clines M.D.   On: 01/26/2017 11:48   Dg Abd Portable 1v  Result Date: 02/10/2017 CLINICAL DATA:  Nausea since early this morning EXAM: PORTABLE ABDOMEN - 1 VIEW COMPARISON:  Portable exam 1238 hours without priors for comparison FINDINGS: Nonobstructive bowel gas pattern. No bowel dilatation or bowel wall thickening. Scattered gas and stool throughout colon to rectum. Bones demineralized with degenerative changes and dextroconvex scoliosis lumbar spine. Scattered atherosclerotic calcifications. IMPRESSION: Normal bowel gas pattern. Electronically Signed   By: Lavonia Dana M.D.   On: 02/10/2017 12:59   Ct Head Code Stroke Wo Contrast  Result Date: 02/04/2017 CLINICAL DATA:  Code stroke. 71 year old male with left facial droop and abnormal gait. Unknown time of onset. EXAM: CT HEAD WITHOUT CONTRAST TECHNIQUE: Contiguous axial images were obtained from the base of the skull through the vertex without intravenous contrast. COMPARISON:  None. FINDINGS: Brain: There is architectural distortion along the anterior corpus callosum, such that no normal genu or rostrum are identified. There is suggestion posterior mass effect on the right frontal horn on series 3, image 15. There is associated abnormal hypodensity in both anterior inferior frontal gyri which more resembles vasogenic edema (  instead of cytotoxic edema) series 3, image 13. In the genu region there is abnormal cortical thickening (primarily in the right hemisphere) and mass like heterogeneity as seen on coronal image 24. No acute intracranial hemorrhage identified. No ventriculomegaly. No areas suggestive of cytotoxic edema identified. The  body and splenium of the corpus callosum appear normal. No cortical encephalomalacia identified. Vascular: Calcified atherosclerosis at the skull base. No suspicious intracranial vascular hyperdensity. Skull: No acute osseous abnormality identified. Sinuses/Orbits: Clear. Other: Calcified scalp vasculature. No acute orbit or scalp soft tissue findings. ASPECTS [facility] Stroke Program Early CT Score) Total score (0-10 with 10 being normal): 10. IMPRESSION: 1. Abnormal appearance of both anterior inferior frontal lobes and the anterior corpus callosum, but more resembling a tumor which has crossed midline rather than infarct. Recommend Brain MRI without and with contrast when possible. 2. No acute intracranial hemorrhage. 3. ASPECTS is 10. 4. The above was relayed via text pager to Dr. Jerelyn Charles on 02/04/2017 at 1541 hours. Electronically Signed   By: Genevie Ann M.D.   On: 02/04/2017 15:45    Microbiology: Recent Results (from the past 240 hour(s))  Surgical pcr screen     Status: None   Collection Time: 02/08/17 10:41 PM  Result Value Ref Range Status   MRSA, PCR NEGATIVE NEGATIVE Final   Staphylococcus aureus NEGATIVE NEGATIVE Final    Comment: (NOTE) The Xpert SA Assay (FDA approved for NASAL specimens in patients 51 years of age and older), is one component of a comprehensive surveillance program. It is not intended to diagnose infection nor to guide or monitor treatment.      Labs: Basic Metabolic Panel: Recent Labs  Lab 02/10/17 0438  02/12/17 0348 02/13/17 0955 02/14/17 0259 02/15/17 0322 02/16/17 0353  NA 137   < > 138 135 137 137 136  K 4.2   < > 3.7 4.7 3.9 3.8 3.8  CL 110   < > 109 107 110 108 107  CO2 20*   < > 23 21* 21* 22 23  GLUCOSE 118*   < > 103* 154* 95 92 109*  BUN 28*   < > 18 22* 26* 20 18  CREATININE 1.52*   < > 1.22 1.79* 1.72* 1.33* 1.30*  CALCIUM 8.3*   < > 8.5* 8.5* 8.3* 8.7* 8.9  MG 1.7  --   --   --   --   --   --   PHOS  --   --   --   --  2.4* 2.7 2.8   <  > = values in this interval not displayed.   Liver Function Tests: Recent Labs  Lab 02/14/17 0259 02/15/17 0322 02/16/17 0353  ALBUMIN 2.8* 3.0* 3.1*   No results for input(s): LIPASE, AMYLASE in the last 168 hours. No results for input(s): AMMONIA in the last 168 hours. CBC: Recent Labs  Lab 02/13/17 0955 02/13/17 2136 02/14/17 0258 02/15/17 0320 02/16/17 0353  WBC 4.2 3.5* 3.0* 2.7* 2.8*  HGB 10.2* 8.8* 9.3* 10.0* 10.3*  HCT 32.7* 27.8* 29.3* 31.9* 31.8*  MCV 90.8 89.7 91.0 91.7 89.6  PLT 106* 90* 85* 90* 89*   Cardiac Enzymes: No results for input(s): CKTOTAL, CKMB, CKMBINDEX, TROPONINI in the last 168 hours. BNP: BNP (last 3 results) Recent Labs    01/25/17 2007  BNP 219.4*    ProBNP (last 3 results) No results for input(s): PROBNP in the last 8760 hours.  CBG: Recent Labs  Lab 02/09/17 1602 02/09/17 1939  GLUCAP 118* 222*       SignedCristal Ford  [facility] 02/16/2017, 1:29 PM

## 2017-02-16 NOTE — Discharge Instructions (Signed)
Glioblastoma Glioblastoma, also called Grade IV astrocytoma, is a type of brain cancer. Glioblastoma tumors are made up of an overgrowth of normal brain cells. A tumor is formed when these cells grow into a mass of tissue. Most people develop glioblastoma between the ages of 16 years and 35 years. What increases the risk? Tumor cells of people who have had a low-grade astrocytoma may progress to this higher-grade type of cell. People who have had radiation exposure or a family cancer syndrome, such as Li-Fraumeni syndrome or Turcot syndrome, are at greater risk of glioblastoma. What are the signs or symptoms? Symptoms of glioblastoma may depend on the size and location of the tumor. Possible symptoms include:  Headache, which may be worse in the morning.  Nausea and vomiting.  Vision changes.  Seizures.  Being unable to walk.  Weakness or numbness on one side of the body or in an arm or leg.  Mood changes.  Problems with memory or thinking.  How is this diagnosed? Brain tumors can usually be seen on brain imaging, such as CT scan or MRI. A sample of the tumor will need to be studied in a laboratory (biopsy) to confirm the diagnosis of glioblastoma. How is this treated? There are several ways that glioblastoma is treated. Often, a person will undergo more than one type of treatment:  Surgery to remove as much of the tumor as possible.  High-energy rays (radiation therapy) to help shrink or kill the tumor.  Chemotherapy to shrink or kill the tumor. Because normal cells may also be killed, chemotherapy has many side effects.  Targeted therapy uses substances that injure or kill cancer cells without affecting normal cells.  Steroid medicine to decrease brain swelling and improve symptoms.  Follow these instructions at home:  Take all medicines as directed by your health care provider.  Go to all of your follow-up appointments. Contact a health care provider if:  Any symptoms  come back.  You have diarrhea, vomiting, or abdominal pain.  You cannot eat or drink what you need.  You are more weak or tired than usual.  You are losing weight without trying. Get help right away if:  Your diarrhea, vomiting, or abdominal pain does not go away.  You have new symptoms, such as vision problems or difficulty walking.  You have a seizure.  You have bleeding that does not stop.  You have trouble breathing.  You have a fever. This information is not intended to replace advice given to you by your health care provider. Make sure you discuss any questions you have with your health care provider. Document Released: 03/26/2013 Document Revised: 08/27/2015 Document Reviewed: 02/20/2013 Elsevier Interactive Patient Education  2017 [employer].

## 2017-02-16 NOTE — Progress Notes (Signed)
Patient's partner states that she needs to go home to "do some things and get ready before I come and take him home" Wheel chair arrived to room, eye patch given to patient with eye drop" will continue to monitor

## 2017-02-16 NOTE — Progress Notes (Signed)
Physical Therapy Treatment Patient Details Name: Jeffrey Perez MRN: [id number] DOB: 05/11/1945 Today's Date: 02/16/2017    History of Present Illness 71 yo male admitted for L facial droop, difficulty walking, dizziness. 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  PMHx:  lumbar scoliosis, atherosclerosis, osteoporosis, AKI, gout, poor sleep, kidney transplant patient, anemia, HTN, PAF, gout, cardiomegaly,     PT Comments    Patient progressing slowly towards PT goals. Reports no nausea post mobility today. Tolerated gait training with cues for gaze stabilization, but continues to be high fall risk due to poor safety awareness and balance deficits. Pt with complete LOB requiring total A to prevent fall. Education re: sitting in front seat of car and getting out every 1-2 hours with family. Recommend family getting a gait belt to assist with safe mobility. Plans to d/c home to [place] with hospice care. Will eventually need w/c, BSC, shower chair and possibly hospital bed. Will follow.   Follow Up Recommendations  Home health PT;Supervision for mobility/OOB     Equipment Recommendations  Rolling walker with 5" wheels;Wheelchair (measurements PT);Wheelchair cushion (measurements PT);3in1 (PT)    Recommendations for Other Services       Precautions / Restrictions Precautions Precautions: Fall Restrictions Weight Bearing Restrictions: No    Mobility  Bed Mobility Overal bed mobility: Needs Assistance Bed Mobility: Supine to Sit     Supine to sit: Supervision;HOB elevated     General bed mobility comments: increased time required and use of rail for support.   Transfers Overall transfer level: Needs assistance Equipment used: Rolling walker (2 wheeled) Transfers: Sit to/from Stand Sit to Stand: Min guard;Min assist         General transfer comment: Assist to power to standing with cues for hand placement/technique. Stood from [employer],  from chair x2. + dizziness. Cues for gaze stabilization.  Ambulation/Gait Ambulation/Gait assistance: Min assist;Total assist Ambulation Distance (Feet): 40 Feet(+ 100') Assistive device: Rolling walker (2 wheeled) Gait Pattern/deviations: Trunk flexed;Drifts right/left;Decreased stride length;Step-through pattern;Staggering left;Staggering right Gait velocity: unsafe speed   General Gait Details: Unsteady gait with staggering towards left on multiple occasions- 1 major LOB requiring total A to prevent fall. Pt with unsafe gait speed. Cues for gaze stabilization 1 seated rest break due to fatigue.   Stairs            Wheelchair Mobility    Modified Rankin (Stroke Patients Only)       Balance Overall balance assessment: Needs assistance Sitting-balance support: Feet supported;No upper extremity supported Sitting balance-Leahy Scale: Fair     Standing balance support: During functional activity;Bilateral upper extremity supported Standing balance-Leahy Scale: Poor Standing balance comment: Pt relies on support from RW for standing balance. Pt c/o dizziness and demonstrates slight swaying despite cues for gaze stabilization.                             Cognition Arousal/Alertness: Awake/alert Behavior During Therapy: WFL for tasks assessed/performed Overall Cognitive Status: Impaired/Different from baseline Area of Impairment: Attention;Memory;Problem solving;Awareness;Safety/judgement                   Current Attention Level: Sustained Memory: Decreased short-term memory   Safety/Judgement: Decreased awareness of safety;Decreased awareness of deficits Awareness: Intellectual Problem Solving: Requires tactile cues;Requires verbal cues;Difficulty sequencing General Comments: Requires repetitin and tactile cues to stop from walking when running into walls and obviously dizzy/losing balance. Poor safety  awareness.       Exercises      General  Comments General comments (skin integrity, edema, etc.): Girlfriend and friends present at beginning of session. Eager to return home to [place].      Pertinent Vitals/Pain Pain Assessment: No/denies pain    Home Living                      Prior Function            PT Goals (current goals can now be found in the care plan section) Progress towards PT goals: Progressing toward goals    Frequency    Min 2X/week      PT Plan Current plan remains appropriate    Co-evaluation              AM-PAC PT "6 Clicks" Daily Activity  Outcome Measure  Difficulty turning over in bed (including adjusting bedclothes, sheets and blankets)?: None Difficulty moving from lying on back to sitting on the side of the bed? : None Difficulty sitting down on and standing up from a chair with arms (e.g., wheelchair, bedside commode, etc,.)?: Unable Help needed moving to and from a bed to chair (including a wheelchair)?: A Little Help needed walking in hospital room?: A Lot Help needed climbing 3-5 steps with a railing? : Total 6 Click Score: 15    End of Session Equipment Utilized During Treatment: Gait belt Activity Tolerance: Patient limited by fatigue Patient left: in chair;with call bell/phone within reach;with nursing/sitter in room(nursing student present in room to give a bath) Nurse Communication: Mobility status PT Visit Diagnosis: Muscle weakness (generalized) (M62.81);Unsteadiness on feet (R26.81);Difficulty in walking, not elsewhere classified (R26.2)     Time: 1610-9604 PT Time Calculation (min) (ACUTE ONLY): 24 min  Charges:  $Gait Training: 23-37 mins                    G Codes:       Wray Kearns, PT, DPT [phone]     [place] 02/16/2017, 12:13 PM

## 2017-02-16 NOTE — Progress Notes (Signed)
Patient ID: Leotha Voeltz, male   DOB: Mar 16, 1946, 71 y.o.   MRN: [id number]  This NP visited patient at the bedside with patient SO Ms Boneta Lucks, as a follow up to  yesterday's [place].  Continued conversation regarding diagnosis, prognosis, and GOCs.  They verbalize an understanding and sadness regarding the current medical  situation.  They are both comfortable in their decision to return home to [place], likely will leave this Saturday morning.  Plan is to initiate hospice services once they get to [place], [facility] has made contact with hospice in [place] and assisting with referral.  Patient's only complaint today is dry irritated left eye, he has difficulty closing completely.  Patch and liquid teas ordered.  Questions and concerns addressed     Family encouraged to call with questions or concerns  Time in  0900     Time out   0935 Total time spent on the unit was 35 minutes  Discussed with Dr Ree Kida  Greater than 50% of the time was spent in counseling and coordination of care  Wadie Lessen NP  Palliative Medicine Team Team Phone # [phone] Pager [phone]

## 2017-02-17 ENCOUNTER — Ambulatory Visit (INDEPENDENT_AMBULATORY_CARE_PROVIDER_SITE_OTHER): Payer: Medicare Other | Admitting: Family Medicine

## 2017-02-17 ENCOUNTER — Encounter: Payer: Self-pay | Admitting: Family Medicine

## 2017-02-17 ENCOUNTER — Telehealth: Payer: Self-pay | Admitting: Family Medicine

## 2017-02-17 VITALS — BP 116/68 | HR 60 | Temp 98.2°F | Ht 66.0 in | Wt 147.2 lb

## 2017-02-17 DIAGNOSIS — Z7901 Long term (current) use of anticoagulants: Secondary | ICD-10-CM

## 2017-02-17 DIAGNOSIS — I482 Chronic atrial fibrillation, unspecified: Secondary | ICD-10-CM

## 2017-02-17 DIAGNOSIS — C719 Malignant neoplasm of brain, unspecified: Secondary | ICD-10-CM | POA: Diagnosis not present

## 2017-02-17 DIAGNOSIS — Z5181 Encounter for therapeutic drug level monitoring: Secondary | ICD-10-CM

## 2017-02-17 LAB — CBC WITH DIFFERENTIAL/PLATELET
Basophils Absolute: 9 cells/uL (ref 0–200)
Basophils Relative: 0.4 %
Eosinophils Absolute: 51 cells/uL (ref 15–500)
Eosinophils Relative: 2.2 %
HEMATOCRIT: 30.3 % — AB (ref 38.5–50.0)
Hemoglobin: 10 g/dL — ABNORMAL LOW (ref 13.2–17.1)
Lymphs Abs: 787 cells/uL — ABNORMAL LOW (ref 850–3900)
MCH: 28.6 pg (ref 27.0–33.0)
MCHC: 33 g/dL (ref 32.0–36.0)
MCV: 86.6 fL (ref 80.0–100.0)
MPV: 10.6 fL (ref 7.5–12.5)
Monocytes Relative: 14.7 %
Neutro Abs: 1116 cells/uL — ABNORMAL LOW (ref 1500–7800)
Neutrophils Relative %: 48.5 %
Platelets: 91 10*3/uL — ABNORMAL LOW (ref 140–400)
RBC: 3.5 10*6/uL — AB (ref 4.20–5.80)
RDW: 13.7 % (ref 11.0–15.0)
TOTAL LYMPHOCYTE: 34.2 %
WBC: 2.3 10*3/uL — AB (ref 3.8–10.8)
WBCMIX: 338 {cells}/uL (ref 200–950)

## 2017-02-17 LAB — PROTIME-INR
INR: 2.6 — ABNORMAL HIGH
Prothrombin Time: 27.5 s — ABNORMAL HIGH (ref 9.0–11.5)

## 2017-02-17 NOTE — Progress Notes (Signed)
Jeffrey Perez, is a 71 y.o. male  MWN:[id number]  QIH:[id number]  DOB - 1945/09/25  CC:  Chief Complaint  Patient presents with  . Establish Care    blood work      HPI: Jeffrey Perez is a 71 y.o. male is here as a new patient today to obtain a PT/INR check only. Medical history significant for chronic atrial fibrillation, hypertension, glioblastoma, and chronic anticoagulation. He was recently hospitalized 02/04/2017 for a suspected CVA. Further evaluation determined that his symptoms of facial drooping and generalized weakness was the result of advanced glioblastoma. Jeffrey Perez also suffers from chronic atrial fibrillation and is managed on Coumadin. PT/INR were subtherapeutic during hospitalization at  1.71/19.9. He presents today only to have a repeat PT/NR level checked as he is moving back to his home in [place] tomorrow morning and will seek hospice services upon his return. He will be traveling with family. He denies any acute concerns or symptoms of headache, chest pain, nausea, or vomiting.  No Known Allergies Past Medical History:  Diagnosis Date  . Acute kidney injury ([place]) 01/25/2017   Archie Endo 01/25/2017  . Anemia   . Arthritis    'all over my bones" (01/26/2017)  . Coronary artery disease   . ESRD (end stage renal disease) on dialysis [facility]) 1992-1994   "stopped when I got the transplant"  . Gout   . History of blood transfusion    "very many times" (01/26/2017)  . Hypertension    Current Outpatient Medications on File Prior to Visit  Medication Sig Dispense Refill  . cholecalciferol (VITAMIN D) 1000 units tablet Take 1,000 Units by mouth daily.    . Cyanocobalamin (VITAMIN B-12 PO) Take by mouth daily.    Marland Kitchen doxazosin (CARDURA) 4 MG tablet Take 1 tablet (4 mg total) 2 (two) times daily by mouth. 60 tablet 0  . Mycophenolate Sodium (MYFORTIC PO) Take 360 mg by mouth 2 (two) times daily.     . pantoprazole (PROTONIX) 40 MG tablet Take 1 tablet (40 mg total) daily by mouth. 30  tablet 0  . predniSONE (DELTASONE) 5 MG tablet Take 1 tablet (5 mg total) daily with breakfast by mouth.    . Pyridoxine HCl (VITAMIN B-6 PO) Take by mouth daily.    . tacrolimus (PROGRAF) 1 MG capsule Take 1 mg by mouth 2 (two) times daily.     Marland Kitchen warfarin (COUMADIN) 1 MG tablet Take 1 tablet (1 mg total) by mouth daily. (Patient taking differently: Take 1 mg by mouth at bedtime. ) 30 tablet 11  . warfarin (COUMADIN) 5 MG tablet Take 5 mg by mouth at bedtime.      No current facility-administered medications on file prior to visit.    Family History  Family history unknown: Yes   Social History   Socioeconomic History  . Marital status: Divorced    Spouse name: Not on file  . Number of children: Not on file  . Years of education: Not on file  . Highest education level: Not on file  Social Needs  . Financial resource strain: Somewhat hard  . Food insecurity - worry: Patient refused  . Food insecurity - inability: Patient refused  . Transportation needs - medical: Patient refused  . Transportation needs - non-medical: Patient refused  Occupational History  . Not on file  Tobacco Use  . Smoking status: Former Smoker    Years: 5.00    Types: Cigarettes    Last attempt to quit: 1965  Years since quitting: 53.9  . Smokeless tobacco: Never Used  Substance and Sexual Activity  . Alcohol use: No  . Drug use: No  . Sexual activity: No  Other Topics Concern  . Not on file  Social History Narrative  . Not on file    Review of Systems: Constitutional: Negative for fever, chills, diaphoresis, activity change, appetite change and fatigue. HENT: Negative for ear pain, nosebleeds, congestion, facial swelling, rhinorrhea, neck pain, neck stiffness and ear discharge.  Eyes: Negative for pain, discharge, redness, itching and visual disturbance. Respiratory: Negative for cough, choking, chest tightness, shortness of breath, wheezing and stridor.  Cardiovascular: Negative for chest  pain, palpitations and leg swelling. Gastrointestinal: Negative for abdominal distention. Genitourinary: Negative for dysuria, urgency, frequency, hematuria, flank pain, decreased urine volume, difficulty urinating and dyspareunia.  Musculoskeletal: Negative for back pain, joint swelling, arthralgia and gait problem. Neurological: Negative for dizziness, tremors, seizures, syncope, facial asymmetry, speech difficulty, weakness, light-headedness, numbness and headaches.  Hematological: Negative for adenopathy. Does not bruise/bleed easily. Psychiatric/Behavioral: Negative for hallucinations, behavioral problems, confusion, dysphoric mood, decreased concentration and agitation.    Objective:   Vitals:   02/17/17 0927  BP: 116/68  Pulse: 60  Temp: 98.2 F (36.8 C)  SpO2: 100%    Physical Exam: Constitutional: Patient appears well-developed and well-nourished. No distress. HENT: Normocephalic  Eyes: Conjunctivae are normal.  no scleral icterus. Neck: Normal ROM. Neck supple.No tracheal deviation. No thyromegaly. CVS: RRR, S1/S2 +, no murmurs, no gallops, no carotid bruit.  Pulmonary: Effort and breath sounds normal, no stridor, rhonchi, wheezes, rales.  Abdominal: Soft. BS +, no distension, tenderness, rebound or guarding.  Musculoskeletal: Immobile, wheelchair dependent,  No edema and no tenderness.  Lymphadenopathy: No lymphadenopathy noted, cervical, inguinal or axillary Neuro: Alert. Normal reflexes, muscle tone coordination. No cranial nerve deficit. Skin: Skin is warm and dry. No rash noted. Not diaphoretic. No erythema. No pallor. Psychiatric: Normal mood and affect. Behavior, judgment, thought content normal.  Lab Results  Component Value Date   WBC 2.8 (L) 02/16/2017   HGB 10.3 (L) 02/16/2017   HCT 31.8 (L) 02/16/2017   MCV 89.6 02/16/2017   PLT 89 (L) 02/16/2017   Lab Results  Component Value Date   CREATININE 1.30 (H) 02/16/2017   BUN 18 02/16/2017   NA 136  02/16/2017   K 3.8 02/16/2017   CL 107 02/16/2017   CO2 23 02/16/2017    Lab Results  Component Value Date   HGBA1C 5.6 05/14/2012   Lipid Panel     Component Value Date/Time   CHOL 196 02/06/2017 0250   TRIG 185 (H) 02/06/2017 0250   HDL 20 (L) 02/06/2017 0250   CHOLHDL 9.8 02/06/2017 0250   VLDL 37 02/06/2017 0250   LDLCALC 139 (H) 02/06/2017 0250        Assessment and plan:  1. Chronic atrial fibrillation (HCC) - Protime-INR-stat, advised patient that he will be notified of results today.    2. Glioblastoma ([place]), patient will be seeking further palliative and or hospice care in [place] .  3. Anticoagulated on Coumadin, PT/INR pending   Orders Placed This Encounter  Procedures  . Protime-INR  . CBC with Differential    -No follow-up on file as patient is relocating tomorrow out of state.  The patient was given clear instructions to go to ER or return to medical center if symptoms don't improve, worsen or new problems develop. The patient verbalized understanding. The patient was told to call to  get lab results if they haven't heard anything in the next week.     This note has been created with Surveyor, quantity. Any transcriptional errors are unintentional.

## 2017-02-17 NOTE — Telephone Encounter (Signed)
Patient notified and will follow up nest week when he gets to [place]

## 2017-02-17 NOTE — Telephone Encounter (Signed)
[facility] called with critical lab results for lab draw taken on 02/17/17: PT 27.5 and INR 2.6; NP Harris notified

## 2017-02-17 NOTE — Telephone Encounter (Signed)
Please contact patient to advise his INR 2.6 and he should continue 6 mg Coumadin. He will need to establish and follow-up with PCP in [place] within 2-4 to have another level checked.    Carroll Sage. Kenton Kingfisher, MSN, FNP-C [facility]  [address] [place], [place] [address] [phone]

## 2017-02-19 ENCOUNTER — Encounter: Payer: Self-pay | Admitting: Family Medicine

## 2017-02-22 ENCOUNTER — Encounter (HOSPITAL_COMMUNITY): Payer: Self-pay

## 2017-03-09 ENCOUNTER — Encounter (HOSPITAL_COMMUNITY): Payer: Self-pay

## 2017-04-04 DEATH — deceased

## 2017-12-22 IMAGING — DX DG ABD PORTABLE 1V
1 series · 1 of 1 positions shown · non-contrast
Comparison: Portable exam 2636 hours without priors for comparison

CLINICAL DATA: Nausea since early this morning

EXAM:
PORTABLE ABDOMEN - 1 VIEW

[abdomen kub]
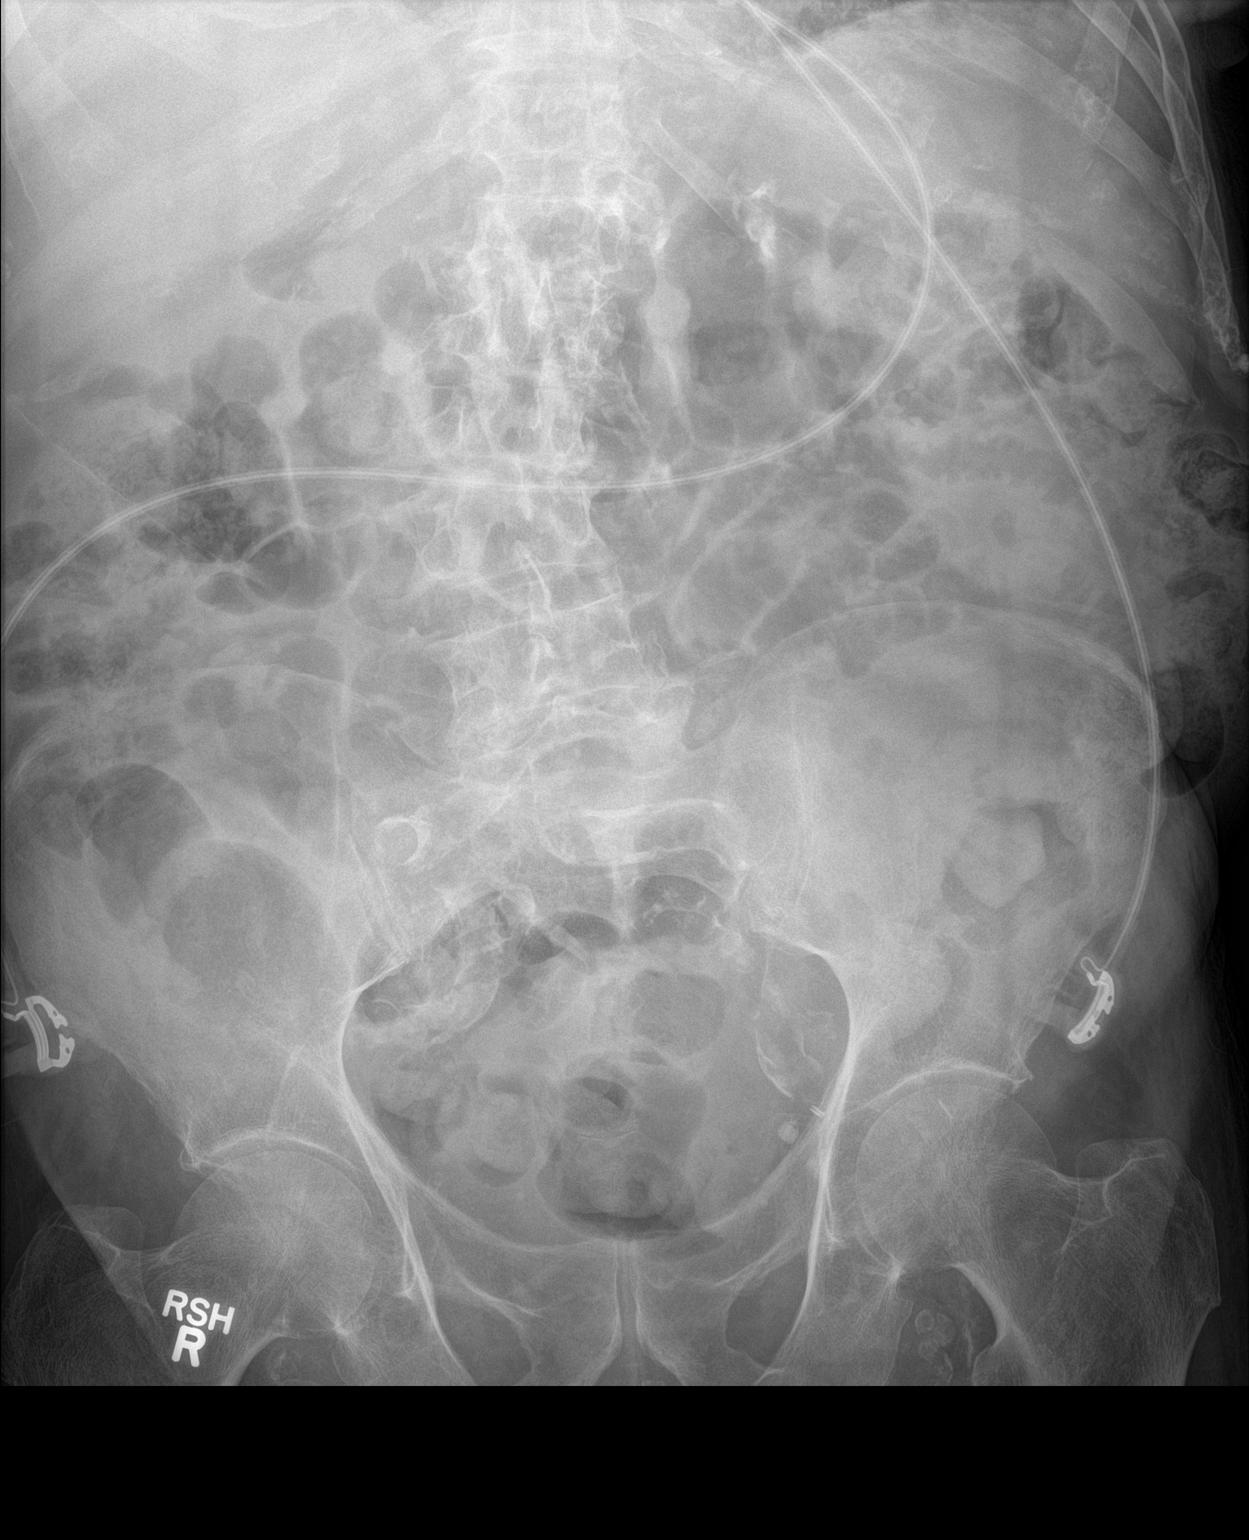

[1 of 1 positions shown; findings below may reference images not displayed]

FINDINGS: Nonobstructive bowel gas pattern.

No bowel dilatation or bowel wall thickening.

Scattered gas and stool throughout colon to rectum.

Bones demineralized with degenerative changes and dextroconvex
scoliosis lumbar spine.

Scattered atherosclerotic calcifications.
IMPRESSION: Normal bowel gas pattern.
